# Patient Record
Sex: Female | Born: 1975 | Race: White | Hispanic: No | Marital: Married | Smoking: Former smoker
Health system: Southern US, Community
[De-identification: ages and names within clinical notes are randomized; demographics above are authoritative.]

## PROBLEM LIST (undated history)

## (undated) DIAGNOSIS — Z8619 Personal history of other infectious and parasitic diseases: Secondary | ICD-10-CM

## (undated) DIAGNOSIS — R091 Pleurisy: Secondary | ICD-10-CM

## (undated) DIAGNOSIS — D649 Anemia, unspecified: Secondary | ICD-10-CM

## (undated) DIAGNOSIS — I10 Essential (primary) hypertension: Secondary | ICD-10-CM

## (undated) DIAGNOSIS — M542 Cervicalgia: Secondary | ICD-10-CM

## (undated) DIAGNOSIS — B019 Varicella without complication: Secondary | ICD-10-CM

## (undated) DIAGNOSIS — E079 Disorder of thyroid, unspecified: Secondary | ICD-10-CM

## (undated) DIAGNOSIS — Z01419 Encounter for gynecological examination (general) (routine) without abnormal findings: Secondary | ICD-10-CM

## (undated) DIAGNOSIS — R079 Chest pain, unspecified: Secondary | ICD-10-CM

## (undated) DIAGNOSIS — E039 Hypothyroidism, unspecified: Secondary | ICD-10-CM

## (undated) DIAGNOSIS — R945 Abnormal results of liver function studies: Secondary | ICD-10-CM

## (undated) DIAGNOSIS — M25511 Pain in right shoulder: Secondary | ICD-10-CM

## (undated) DIAGNOSIS — T2220XA Burn of second degree of shoulder and upper limb, except wrist and hand, unspecified site, initial encounter: Secondary | ICD-10-CM

## (undated) DIAGNOSIS — R87629 Unspecified abnormal cytological findings in specimens from vagina: Secondary | ICD-10-CM

## (undated) DIAGNOSIS — J209 Acute bronchitis, unspecified: Secondary | ICD-10-CM

## (undated) DIAGNOSIS — Z5189 Encounter for other specified aftercare: Secondary | ICD-10-CM

## (undated) DIAGNOSIS — Z0000 Encounter for general adult medical examination without abnormal findings: Secondary | ICD-10-CM

## (undated) DIAGNOSIS — R51 Headache: Secondary | ICD-10-CM

## (undated) DIAGNOSIS — J329 Chronic sinusitis, unspecified: Secondary | ICD-10-CM

## (undated) HISTORY — DX: Chronic sinusitis, unspecified: J32.9

## (undated) HISTORY — DX: Varicella without complication: B01.9

## (undated) HISTORY — PX: TRUNK SKIN LESION EXCISIONAL BIOPSY: SUR474

## (undated) HISTORY — DX: Personal history of other infectious and parasitic diseases: Z86.19

## (undated) HISTORY — DX: Pleurisy: R09.1

## (undated) HISTORY — DX: Abnormal results of liver function studies: R94.5

## (undated) HISTORY — DX: Encounter for other specified aftercare: Z51.89

## (undated) HISTORY — DX: Anemia, unspecified: D64.9

## (undated) HISTORY — DX: Encounter for general adult medical examination without abnormal findings: Z00.00

## (undated) HISTORY — DX: Disorder of thyroid, unspecified: E07.9

## (undated) HISTORY — DX: Acute bronchitis, unspecified: J20.9

## (undated) HISTORY — DX: Cervicalgia: M54.2

## (undated) HISTORY — DX: Unspecified abnormal cytological findings in specimens from vagina: R87.629

## (undated) HISTORY — DX: Burn of second degree of shoulder and upper limb, except wrist and hand, unspecified site, initial encounter: T22.20XA

## (undated) HISTORY — DX: Headache: R51

## (undated) HISTORY — DX: Pain in right shoulder: M25.511

## (undated) HISTORY — PX: GYNECOLOGIC CRYOSURGERY: SHX857

## (undated) HISTORY — DX: Essential (primary) hypertension: I10

## (undated) HISTORY — DX: Encounter for gynecological examination (general) (routine) without abnormal findings: Z01.419

## (undated) HISTORY — DX: Hypothyroidism, unspecified: E03.9

## (undated) HISTORY — DX: Chest pain, unspecified: R07.9

---

## 2009-12-01 LAB — HM PAP SMEAR

## 2010-08-02 ENCOUNTER — Ambulatory Visit: Payer: Self-pay | Admitting: Internal Medicine

## 2010-09-08 ENCOUNTER — Ambulatory Visit (INDEPENDENT_AMBULATORY_CARE_PROVIDER_SITE_OTHER): Payer: Managed Care, Other (non HMO) | Admitting: Family Medicine

## 2010-09-08 ENCOUNTER — Encounter: Payer: Self-pay | Admitting: Family Medicine

## 2010-09-08 VITALS — BP 168/100 | HR 62 | Temp 98.2°F | Ht 65.75 in | Wt 122.1 lb

## 2010-09-08 DIAGNOSIS — Z8619 Personal history of other infectious and parasitic diseases: Secondary | ICD-10-CM

## 2010-09-08 DIAGNOSIS — T2220XA Burn of second degree of shoulder and upper limb, except wrist and hand, unspecified site, initial encounter: Secondary | ICD-10-CM

## 2010-09-08 DIAGNOSIS — E079 Disorder of thyroid, unspecified: Secondary | ICD-10-CM

## 2010-09-08 DIAGNOSIS — I10 Essential (primary) hypertension: Secondary | ICD-10-CM

## 2010-09-08 DIAGNOSIS — B019 Varicella without complication: Secondary | ICD-10-CM

## 2010-09-08 HISTORY — DX: Varicella without complication: B01.9

## 2010-09-08 HISTORY — DX: Personal history of other infectious and parasitic diseases: Z86.19

## 2010-09-08 NOTE — Patient Instructions (Signed)
Heartburn Heartburn is a painful, burning sensation in the chest. It may feel worse in certain positions, such as lying down or bending over. It is caused by stomach acid backing up into the tube that carries food from the mouth down to the stomach (lower esophagus).  CAUSES A number of conditions can cause or worsen heartburn, including:  Pregnancy.  Being overweight (obesity).  A condition called hiatal hernia, in which part or all of the stomach is moved up into the chest through a weakness in the diaphragm muscle.  Alcohol.  Exercise.  Eating just before going to bed.  Overeating.  Medications, including:  Nonsteroidal anti-inflammatory drugs, such as ibuprofen and naproxen.  Aspirin.  Some blood pressure medicines, including beta-blockers, calcium channel blockers, and alpha-blockers.  Nitrates (used to treat angina).  The asthma medication Theophylline.  Certain sedative drugs.  Heartburn may be worse after eating certain foods. These heartburn-causing foods are different for different people, but may include:  Peppers.  Chocolate.  Coffee.  High-fat foods, including fried foods.  Spicy foods.  Garlic, onions.  Citrus fruits, including oranges, grapefruit, lemons and limes.  Food containing tomatoes or tomato products.  Mint.  Carbonated beverages.  Vinegar.  SYMPTOMS  Symptoms may last for a few minutes or a few hours, and can include: Burning pain in the chest or lower throat.  Bitter taste in the mouth.  Coughing.  DIAGNOSIS If the usual treatments for heartburn do not improve your symptoms, then tests may be done to see if there is another condition present. Possible tests may include: X-rays.  Endoscopy. This is when a tube with a light and a camera on the end is used to examine the esophagus and the stomach.  Blood, breath, or stool tests may be used to check for bacteria that cause ulcers.  TREATMENT There are a number of non-prescription medicines used to treat  heartburn, including: Antacids.  Acid reducers (also called H-2 blockers).  Proton-pump inhibitors.  HOME CARE INSTRUCTIONS Raise the head of your bed by putting blocks under the legs.  Eat 2-3 hours before going to bed.  Stop smoking.  Try to reach and maintain a healthy weight.  Do not eat just a few very large meals. Instead, eat many smaller meals throughout the day.  Try to identify foods and beverages that make your symptoms worse, and avoid these.  Avoid tight clothing.  Do not exercise right after eating.  SEEK IMMEDIATE MEDICAL CARE IF YOU: Have severe chest pain that goes down your arm, or into your jaw or neck.  Feel sweaty, dizzy, or lightheaded.  Are short of breath.  Throw up (vomit) blood.  Have difficulty or pain with swallowing.  Have bloody or black, tarry stools.  Have bouts of heartburn more than three times a week for more than two weeks.  Document Released: 05/06/2008 Document Re-Released: 03/14/2009 [facility] Patient Information 2011 [place], [place].Allergies, Generic Allergies may happen from anything your body is sensitive to. This may be food, medicines, pollens, chemicals, and nearly anything around you in everyday life that produces allergens. An allergen is anything that causes an allergy producing substance. Heredity is often a factor in causing these problems. This means you may have some of the same allergies as your parents. Food allergies happen in all age groups. Food allergies are some of the most severe and life threatening. Some common food allergies are cow's milk, seafood, eggs, nuts, wheat, and soybeans. SYMPTOMS Swelling around the mouth.  An itchy red  rash or hives.  Vomiting or diarrhea.  Difficulty breathing.  SEVERE ALLERGIC REACTIONS ARE LIFE-THREATENING.  This reaction is called anaphylaxis. It can cause the mouth and throat to swell and cause difficulty with breathing and swallowing. In severe reactions only a trace amount of food (for  example, peanut oil in a salad) may cause death within seconds. Seasonal allergies occur in all age groups. These are seasonal because they usually occur during the same season every year. They may be a reaction to molds, grass pollens, or tree pollens. Other causes of problems are house dust mite allergens, pet dander, and mold spores. The symptoms often consist of nasal congestion, a runny itchy nose associated with sneezing, and tearing itchy eyes. There is often an associated itching of the mouth and ears. The problems happen when you come in contact with pollens and other allergens. Allergens are the particles in the air that the body reacts to with an allergic reaction. This causes you to release allergic antibodies. Through a chain of events, these eventually cause you to release histamine into the blood stream. Although it is meant to be protective to the body, it is this release that causes your discomfort. This is why you were given anti-histamines to feel better. If you are unable to pinpoint the offending allergen, it may be determined by skin or blood testing. Allergies cannot be cured but can be controlled with medicine. Hay fever is a collection of all or some of the seasonal allergy problems. It may often be treated with simple over-the-counter medicine such as diphenhydramine. Take medicine as directed. Do not drink alcohol or drive while taking this medicine. Check with your caregiver or package insert for child dosages. If these medicines are not effective, there are many new medicines your caregiver can prescribe. Stronger medicine such as nasal spray, eye drops, and corticosteroids may be used if the first things you try do not work well. Other treatments such as immunotherapy or desensitizing injections can be used if all else fails. Follow up with your caregiver if problems continue. These seasonal allergies are usually not life threatening. They are generally more of a nuisance that can  often be handled using medicine. HOME CARE INSTRUCTIONS If unsure what causes a reaction, keep a diary of foods eaten and symptoms that follow. Avoid foods that cause reactions.  If hives or rash are present:  Take medicine as directed.  You may use an over-the-counter antihistamine (diphenhydramine) for hives and itching as needed.  Apply cold compresses (cloths) to the skin or take baths in cool water. Avoid hot baths or showers. Heat will make a rash and itching worse.  If you are severely allergic:  Following a treatment for a severe reaction, hospitalization is often required for closer follow-up.  Wear a medic-alert bracelet or necklace stating the allergy.  You and your family must learn how to give adrenaline or use an anaphylaxis kit.  If you have had a severe reaction, always carry your anaphylaxis kit or EpiPen with you. Use this medicine as directed by your caregiver if a severe reaction is occurring. Failure to do so could have a fatal outcome.  SEE YOUR CAREGIVER IF: You suspect a food allergy. Symptoms generally happen within 30 minutes of eating a food.  Your symptoms have not gone away within 2 days or are getting worse.  You develop new symptoms.  You want to retest yourself or your child with a food or drink you think causes an allergic reaction.  Never do this if an anaphylactic reaction to that food or drink has happened before. Only do this under the care of a caregiver.  SEEK IMMEDIATE MEDICAL CARE IF: You have difficulty breathing, are wheezing, or have a tight feeling in your chest or throat.  You have a swollen mouth, or you have hives, swelling, or itching all over your body.  You have had a severe reaction that has responded to your anaphylaxis kit or an EpiPen. These reactions may return when the medicine has worn off. These reactions should be considered life threatening.  MAKE SURE YOU:  Understand these instructions.  Will watch your condition.  Will get help  right away if you are not doing well or get worse.  Document Released: 03/13/2002 Document Re-Released: 01/09/2009 [facility] Patient Information 2011 [place], [place].Burn Care Instructions Your skin is a natural barrier to infection. It is the largest organ of your body. Because of your burn, this natural protection has been damaged. To help prevent infection, it is very important to follow these instructions in the care of your burn. BURNS ARE CLASSIFIED AS:  First degree - only erythema or redness of skin. No scarring expected.   Second degree - blistering of skin. No scarring expected.   Third degree - destruction of all layers of skin, scarring expected. Depending on size it may require grafting.  HOME CARE INSTRUCTIONS  Wash hands well before changing your bandage.   Remove old bandage. (If bandage sticks to burn you may soak it off with cool, clean water).   Cleanse thoroughly but gently with mild soap and water.   Pat dry with a clean, dry cloth.   Apply a thin layer of anti-bacterial (germ) cream to the burn.   Apply clean bandages as shown to you in the Emergency Department or by your caregiver.   Keep dressing as clean and dry as possible.   Elevate affected area (such as hand or foot) for the first 24 hours, then as instructed by caregiver.   Only take over-the-counter or prescription medicines for pain, discomfort, or fever as directed by your caregiver.  SEEK IMMEDIATE MEDICAL CARE IF:  You develop excessive pain.   The burned area develops redness, tenderness, swelling, or red streaks near the burn.   The burned area develops pus or a foul odor.   You develop an oral temperature above 103.  MAKE SURE YOU:   Understand these instructions.   Will watch your condition.   Will get help right away if you are not doing well or get worse.  Document Released: 12/18/2004 Document Re-Released: 06/07/2009 [facility] Patient Information 2011 [place], [place].   Throat  irritation can be allergies or heartburn or both  Consider Loratadine/Claritin 10mg  daily for allergies and/or Ranitidine/Zantac 150 mg daily for the heartburn and see if symptoms resolve on either one or on both  For burn consider Cetaphil to wash gently daily and keep clean and dry, may apply Neosporin if cracks and/or gets uncomfortable

## 2010-09-12 ENCOUNTER — Encounter: Payer: Self-pay | Admitting: Family Medicine

## 2010-09-12 DIAGNOSIS — T2220XA Burn of second degree of shoulder and upper limb, except wrist and hand, unspecified site, initial encounter: Secondary | ICD-10-CM

## 2010-09-12 DIAGNOSIS — E079 Disorder of thyroid, unspecified: Secondary | ICD-10-CM | POA: Insufficient documentation

## 2010-09-12 DIAGNOSIS — I10 Essential (primary) hypertension: Secondary | ICD-10-CM | POA: Insufficient documentation

## 2010-09-12 HISTORY — DX: Burn of second degree of shoulder and upper limb, except wrist and hand, unspecified site, initial encounter: T22.20XA

## 2010-09-12 NOTE — Assessment & Plan Note (Signed)
Managed on levothyroxine, will check levels and obtain old labs from previous practitioners, before making any changes
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
## 2010-09-12 NOTE — Assessment & Plan Note (Signed)
BP well controlled on low dose of Toprol, will not make any changes to meds at today's visit. Minimize sodium, maintain good exercise regimen and attempt 7-8 hours of sleep.
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
## 2010-11-13 ENCOUNTER — Other Ambulatory Visit (INDEPENDENT_AMBULATORY_CARE_PROVIDER_SITE_OTHER): Payer: Managed Care, Other (non HMO)

## 2010-11-13 DIAGNOSIS — I10 Essential (primary) hypertension: Secondary | ICD-10-CM

## 2010-11-13 DIAGNOSIS — E079 Disorder of thyroid, unspecified: Secondary | ICD-10-CM

## 2010-11-13 LAB — CBC
MCHC: 33.9 g/dL (ref 30.0–36.0)
Platelets: 188 10*3/uL (ref 150–400)
RDW: 12.2 % (ref 11.5–15.5)
WBC: 4 10*3/uL (ref 4.0–10.5)

## 2010-11-14 LAB — HEPATIC FUNCTION PANEL
AST: 49 U/L — ABNORMAL HIGH (ref 0–37)
Albumin: 4.2 g/dL (ref 3.5–5.2)
Alkaline Phosphatase: 67 U/L (ref 39–117)
Total Bilirubin: 0.9 mg/dL (ref 0.3–1.2)

## 2010-11-14 LAB — RENAL FUNCTION PANEL
Albumin: 4.2 g/dL (ref 3.5–5.2)
BUN: 14 mg/dL (ref 6–23)
CO2: 27 mEq/L (ref 19–32)
Calcium: 9.2 mg/dL (ref 8.4–10.5)
Creatinine, Ser: 0.8 mg/dL (ref 0.4–1.2)

## 2010-11-15 ENCOUNTER — Other Ambulatory Visit: Payer: Managed Care, Other (non HMO)

## 2010-12-08 ENCOUNTER — Ambulatory Visit (INDEPENDENT_AMBULATORY_CARE_PROVIDER_SITE_OTHER): Payer: Managed Care, Other (non HMO) | Admitting: Family Medicine

## 2010-12-08 ENCOUNTER — Encounter: Payer: Self-pay | Admitting: Family Medicine

## 2010-12-08 DIAGNOSIS — Z01419 Encounter for gynecological examination (general) (routine) without abnormal findings: Secondary | ICD-10-CM

## 2010-12-08 DIAGNOSIS — M542 Cervicalgia: Secondary | ICD-10-CM

## 2010-12-08 DIAGNOSIS — E079 Disorder of thyroid, unspecified: Secondary | ICD-10-CM

## 2010-12-08 DIAGNOSIS — R51 Headache: Secondary | ICD-10-CM

## 2010-12-08 DIAGNOSIS — Z124 Encounter for screening for malignant neoplasm of cervix: Secondary | ICD-10-CM

## 2010-12-08 DIAGNOSIS — I10 Essential (primary) hypertension: Secondary | ICD-10-CM

## 2010-12-08 MED ORDER — CYCLOBENZAPRINE HCL 10 MG PO TABS: 10.0000 mg | ORAL_TABLET | Freq: Three times a day (TID) | ORAL | Status: DC | PRN

## 2010-12-08 NOTE — Patient Instructions (Signed)
Back Pain, Adult Low back pain is very common. About 1 in 5 people have back pain.The cause of low back pain is rarely dangerous. The pain often gets better over time.About half of people with a sudden onset of back pain feel better in just 2 weeks. About 8 in 10 people feel better by 6 weeks.  CAUSES Some common causes of back pain include:  Strain of the muscles or ligaments supporting the spine.   Wear and tear (degeneration) of the spinal discs.   Arthritis.   Direct injury to the back.  DIAGNOSIS Most of the time, the direct cause of low back pain is not known.However, back pain can be treated effectively even when the exact cause of the pain is unknown.Answering your caregiver's questions about your overall health and symptoms is one of the most accurate ways to make sure the cause of your pain is not dangerous. If your caregiver needs more information, he or she may order lab work or imaging tests (X-rays or MRIs).However, even if imaging tests show changes in your back, this usually does not require surgery. HOME CARE INSTRUCTIONS For many people, back pain returns.Since low back pain is rarely dangerous, it is often a condition that people can learn to manageon their own.   Remain active. It is stressful on the back to sit or stand in one place. Do not sit, drive, or stand in one place for more than 30 minutes at a time. Take short walks on level surfaces as soon as pain allows.Try to increase the length of time you walk each day.   Do not stay in bed.Resting more than 1 or 2 days can delay your recovery.   Do not avoid exercise or work.Your body is made to move.It is not dangerous to be active, even though your back may hurt.Your back will likely heal faster if you return to being active before your pain is gone.   Pay attention to your body when you bend and lift. Many people have less discomfortwhen lifting if they bend their knees, keep the load close to their  bodies,and avoid twisting. Often, the most comfortable positions are those that put less stress on your recovering back.   Find a comfortable position to sleep. Use a firm mattress and lie on your side with your knees slightly bent. If you lie on your back, put a pillow under your knees.   Only take over-the-counter or prescription medicines as directed by your caregiver. Over-the-counter medicines to reduce pain and inflammation are often the most helpful.Your caregiver may prescribe muscle relaxant drugs.These medicines help dull your pain so you can more quickly return to your normal activities and healthy exercise.   Put ice on the injured area.   Put ice in a plastic bag.   Place a towel between your skin and the bag.   Leave the ice on for 15 to 20 minutes, 3 to 4 times a day for the first 2 to 3 days. After that, ice and heat may be alternated to reduce pain and spasms.   Ask your caregiver about trying back exercises and gentle massage. This may be of some benefit.   Avoid feeling anxious or stressed.Stress increases muscle tension and can worsen back pain.It is important to recognize when you are anxious or stressed and learn ways to manage it.Exercise is a great option.  SEEK MEDICAL CARE IF:  You have pain that is not relieved with rest or medicine.   You have   pain that does not improve in 1 week.   You have new symptoms.   You are generally not feeling well.  SEEK IMMEDIATE MEDICAL CARE IF:   You have pain that radiates from your back into your legs.   You develop new bowel or bladder control problems.   You have unusual weakness or numbness in your arms or legs.   You develop nausea or vomiting.   You develop abdominal pain.   You feel faint.  Document Released: 12/18/2004 Document Revised: 08/30/2010 Document Reviewed: 05/08/2010 [facility] Patient Information 2012 [place], [place].   Try moist heat, gentle stretching and aspercreme as needed

## 2010-12-08 NOTE — Progress Notes (Signed)
Marisa Contreras [id number] [birth year] 12/08/2010      Progress Note-Follow Up  Subjective  Chief Complaint  Chief Complaint  Patient presents with  . Gynecologic Exam    pap smear w/ breast exam    HPI  35 year old Caucasian female who is in today for GYN exam and is requesting to discontinue is a patient of her metoprolol because she is considering attempting pregnancy. She is complaining of some trouble with left-sided headache about a week and half ago. She describes it as a sharp pain in her left parietal region without any other associated symptoms. Then about a week ago she had an episode of right eye field of vision going wavy. She noticed these visual changes have occurred off and on since she was 17 and are infrequent. He did not have any associated symptoms with them. She's also had a lot of neck pain in the last week. Woke up in the morning with severe pain and stiffness the left side of her neck to the point where is difficult to turn her head. She has had some massage therapy and that is helpful temporarily. Unfortunately it recurs. No falls or trauma. No radicular symptoms. No fevers, congestion, chest pain, palpitations, shortness of breath, GI or GU complaints. GYN history G0 P0 and denies any history of abnormal Paps.  Past Medical History  Diagnosis Date  . Chicken pox 09/08/2010  . Second degree burn of arm 09/12/2010  . Hypertension   . Thyroid disease     Past Surgical History  Procedure Date  . Trunk skin lesion excisional biopsy     abd, benign    Family History  Problem Relation Age of Onset  . Hypertension Father   . Thyroid disease Maternal Grandmother   . Hyperlipidemia Maternal Grandmother   . Heart disease Maternal Grandfather   . Alcohol abuse Paternal Grandmother   . Heart attack Paternal Grandfather   . Heart disease Paternal Grandfather     History   Social History  . Marital Status: Married    Spouse Name: N/A    Number of Children: N/A    . Years of Education: N/A   Occupational History  . Not on file.   Social History Main Topics  . Smoking status: Former Smoker    Types: Cigarettes    Quit date: 01/01/2006  . Smokeless tobacco: Never Used  . Alcohol Use: Yes     2 bottle of wine a week  . Drug Use: No  . Sexually Active: Yes -- Female partner(s)   Other Topics Concern  . Not on file   Social History Narrative  . No narrative on file    Current Outpatient Prescriptions on File Prior to Visit  Medication Sig Dispense Refill  . levothyroxine (SYNTHROID, LEVOTHROID) 88 MCG tablet Take 88 mcg by mouth daily.        . metoprolol succinate (TOPROL-XL) 25 MG 24 hr tablet         No Known Allergies  Review of Systems  Review of Systems  Constitutional: Negative for fever, chills and malaise/fatigue.  HENT: Positive for neck pain. Negative for hearing loss, nosebleeds and congestion.   Eyes: Positive for blurred vision. Negative for double vision, photophobia, pain and discharge.       Right eye wavy vision, 1 self limited episode since last visit  Respiratory: Negative for cough, sputum production, shortness of breath and wheezing.   Cardiovascular: Negative for chest pain, palpitations and leg swelling.  Gastrointestinal: Negative  for heartburn, nausea, vomiting, abdominal pain, diarrhea, constipation and blood in stool.  Genitourinary: Negative for dysuria, urgency, frequency and hematuria.  Musculoskeletal: Negative for myalgias, back pain and falls.  Skin: Negative for rash.  Neurological: Positive for headaches. Negative for dizziness, tremors, sensory change, focal weakness, loss of consciousness and weakness.  Endo/Heme/Allergies: Negative for polydipsia. Does not bruise/bleed easily.  Psychiatric/Behavioral: Negative for depression and suicidal ideas. The patient is not nervous/anxious and does not have insomnia.     Objective  BP 136/85  Pulse 54  Temp(Src) 98.6 F (37 C) (Oral)  Ht 5' 5.75"  (1.67 m)  Wt 128 lb 12.8 oz (58.423 kg)  BMI 20.95 kg/m2  SpO2 99%  LMP 12/02/2010  Physical Exam  Physical Exam  Constitutional: She is oriented to person, place, and time and well-developed, well-nourished, and in no distress. No distress.  HENT:  Head: Normocephalic and atraumatic.  Eyes: Conjunctivae are normal.  Neck: Neck supple. No thyromegaly present.  Cardiovascular: Normal rate, regular rhythm and normal heart sounds.   No murmur heard. Pulmonary/Chest: Effort normal and breath sounds normal. She has no wheezes.  Abdominal: She exhibits no distension and no mass.  Musculoskeletal: She exhibits no edema.  Lymphadenopathy:    She has no cervical adenopathy.  Neurological: She is alert and oriented to person, place, and time.  Skin: Skin is warm and dry. No rash noted. She is not diaphoretic.  Psychiatric: Memory, affect and judgment normal.    Lab Results  Component Value Date   TSH 1.13 11/13/2010   Lab Results  Component Value Date   WBC 4.0 11/13/2010   HGB 13.0 11/13/2010   HCT 38.3 11/13/2010   MCV 88.7 11/13/2010   PLT 188 11/13/2010   Lab Results  Component Value Date   CREATININE 0.8 11/13/2010   BUN 14 11/13/2010   NA 138 11/13/2010   K 4.5 11/13/2010   CL 103 11/13/2010   CO2 27 11/13/2010   Lab Results  Component Value Date   ALT 43* 11/13/2010   AST 49* 11/13/2010   ALKPHOS 67 11/13/2010   BILITOT 0.9 11/13/2010     Assessment & Plan   Hypertension Adequately controlled, patient anxious to come off of medications, she is encouraged to avoid sodium, get adequate sleep, increase exercise and she is warned that it is possible that her HA will get worse when she decreases her dosing but she wants to try dropping her Metoprolol to 1/2 tab, she will monitor her BP and symptoms and increase med back up as needed. If numbers remain acceptable we will proceed with bp check in 2 weeks and consider discontinuation of meds  Thyroid disease Stable  on current dose of Levothyroxine  Neck pain, acute Patient acknowledges being under increased stress lately and has been having some neck pain and had 1 HA. She describes all of her symptoms as, left. She woke up recently with pain and stiffness in the left side of her neck to the point where she couldn't turn her head. She's been going to get some massage therapy and that does give her some relief. Just an episode of some parietal lobe left-sided headache she describes as sharp lasting about 20 minutes a week and a half ago resolved spontaneously and had no other associated symptoms. She is encouraged to apply moist heat apply gentle stretching continue with massage and consider chiropractic. She is given some cyclobenzaprine to use when necessary and may also use ibuprofen when necessary for headaches  reported symptoms worsen or further concerns  Visit for gynecologic examination Pap smear taken today results pending Patient declines flu shot today.  Headache Patient had one left sided HA recently but she also describes symptoms c/w ocular migraines. She reports she has had episodes of her right eye field of vision going wavy dating back to age 31. She had an episode about a week ago. No other symptoms at that time and the episode resolved spontaneously. She is encouraged to decrease stress, increase sleep and exercise, eat small, frequent meals and watch her food for any possible triggers, Maintain adequate hydration and report if symptoms worsen

## 2010-12-11 ENCOUNTER — Encounter: Payer: Self-pay | Admitting: Family Medicine

## 2010-12-11 DIAGNOSIS — M25511 Pain in right shoulder: Secondary | ICD-10-CM | POA: Insufficient documentation

## 2010-12-11 DIAGNOSIS — Z01419 Encounter for gynecological examination (general) (routine) without abnormal findings: Secondary | ICD-10-CM

## 2010-12-11 DIAGNOSIS — R519 Headache, unspecified: Secondary | ICD-10-CM | POA: Insufficient documentation

## 2010-12-11 DIAGNOSIS — Z0000 Encounter for general adult medical examination without abnormal findings: Secondary | ICD-10-CM | POA: Insufficient documentation

## 2010-12-11 DIAGNOSIS — M542 Cervicalgia: Secondary | ICD-10-CM

## 2010-12-11 DIAGNOSIS — R51 Headache: Secondary | ICD-10-CM

## 2010-12-11 HISTORY — DX: Encounter for general adult medical examination without abnormal findings: Z00.00

## 2010-12-11 HISTORY — DX: Pain in right shoulder: M25.511

## 2010-12-11 HISTORY — DX: Cervicalgia: M54.2

## 2010-12-11 HISTORY — DX: Encounter for gynecological examination (general) (routine) without abnormal findings: Z01.419

## 2010-12-11 HISTORY — DX: Headache: R51

## 2010-12-11 NOTE — Assessment & Plan Note (Signed)
Patient had one left sided HA recently but she also describes symptoms c/w ocular migraines. She reports she has had episodes of her right eye field of vision going wavy dating back to age 35. She had an episode about a week ago. No other symptoms at that time and the episode resolved spontaneously. She is encouraged to decrease stress, increase sleep and exercise, eat small, frequent meals and watch her food for any possible triggers, Maintain adequate hydration and report if symptoms worsen

## 2010-12-11 NOTE — Assessment & Plan Note (Signed)
Patient acknowledges being under increased stress lately and has been having some neck pain and had 1 HA. She describes all of her symptoms as, left. She woke up recently with pain and stiffness in the left side of her neck to the point where she couldn't turn her head. She's been going to get some massage therapy and that does give her some relief. Just an episode of some parietal lobe left-sided headache she describes as sharp lasting about 20 minutes a week and a half ago resolved spontaneously and had no other associated symptoms. She is encouraged to apply moist heat apply gentle stretching continue with massage and consider chiropractic. She is given some cyclobenzaprine to use when necessary and may also use ibuprofen when necessary for headaches reported symptoms worsen or further concerns

## 2010-12-11 NOTE — Assessment & Plan Note (Addendum)
Adequately controlled, patient anxious to come off of medications, she is encouraged to avoid sodium, get adequate sleep, increase exercise and she is warned that it is possible that her HA will get worse when she decreases her dosing but she wants to try dropping her Metoprolol to 1/2 tab, she will monitor her BP and symptoms and increase med back up as needed. If numbers remain acceptable we will proceed with bp check in 2 weeks and consider discontinuation of meds

## 2010-12-11 NOTE — Assessment & Plan Note (Signed)
Stable on current dose of Levothyroxine 

## 2010-12-11 NOTE — Assessment & Plan Note (Signed)
Pap smear taken today results pending Patient declines flu shot today.

## 2010-12-13 ENCOUNTER — Other Ambulatory Visit (HOSPITAL_COMMUNITY)
Admission: RE | Admit: 2010-12-13 | Discharge: 2010-12-13 | Disposition: A | Discharge status: Home / Self Care | Payer: Managed Care, Other (non HMO) | Source: Ambulatory Visit | Attending: Family Medicine | Admitting: Family Medicine

## 2010-12-15 NOTE — Progress Notes (Signed)
Quick Note:  Left a message for pt to return call. ______ 

## 2010-12-18 ENCOUNTER — Telehealth: Payer: Self-pay | Admitting: Family Medicine

## 2010-12-18 NOTE — Telephone Encounter (Signed)
Please contact patient with lab results.

## 2010-12-19 NOTE — Telephone Encounter (Signed)
Pt informed

## 2011-04-02 ENCOUNTER — Telehealth: Payer: Self-pay | Admitting: Family Medicine

## 2011-04-02 MED ORDER — ACEBUTOLOL HCL 200 MG PO CAPS: 200.0000 mg | ORAL_CAPSULE | Freq: Two times a day (BID) | ORAL | Status: DC

## 2011-04-02 NOTE — Telephone Encounter (Signed)
Patient would like to try the new medication. I will send to [facility] on [address] per patients request.

## 2011-04-02 NOTE — Telephone Encounter (Signed)
appt made for 04-16-11 at 3:30 pm

## 2011-04-02 NOTE — Telephone Encounter (Signed)
Please advise 

## 2011-04-02 NOTE — Telephone Encounter (Signed)
The only beta blocker that is category B is Acebutolol 200mg  po bid, I can prescribe it but then she would have to come in in 1-2 weeks to have bp checked, Disp #60 or she can discuss with her OB and see which options she is comfortable with

## 2011-04-13 ENCOUNTER — Ambulatory Visit: Payer: Managed Care, Other (non HMO) | Admitting: Family Medicine

## 2011-04-16 ENCOUNTER — Ambulatory Visit (INDEPENDENT_AMBULATORY_CARE_PROVIDER_SITE_OTHER): Payer: Managed Care, Other (non HMO) | Admitting: Family Medicine

## 2011-04-16 ENCOUNTER — Encounter: Payer: Self-pay | Admitting: Family Medicine

## 2011-04-16 VITALS — BP 119/79 | HR 59 | Temp 98.3°F | Ht 65.75 in | Wt 127.0 lb

## 2011-04-16 DIAGNOSIS — E079 Disorder of thyroid, unspecified: Secondary | ICD-10-CM

## 2011-04-16 DIAGNOSIS — I10 Essential (primary) hypertension: Secondary | ICD-10-CM

## 2011-04-16 DIAGNOSIS — M542 Cervicalgia: Secondary | ICD-10-CM

## 2011-04-16 DIAGNOSIS — R7989 Other specified abnormal findings of blood chemistry: Secondary | ICD-10-CM

## 2011-04-16 HISTORY — DX: Other specified abnormal findings of blood chemistry: R79.89

## 2011-04-16 MED ORDER — ACEBUTOLOL HCL 200 MG PO CAPS: 200.0000 mg | ORAL_CAPSULE | Freq: Two times a day (BID) | ORAL | Status: DC

## 2011-04-16 NOTE — Assessment & Plan Note (Signed)
Patient start Acebutolol bid and has good bp control today, is given a 3 month supply and she is encouraged to increase exercise

## 2011-04-16 NOTE — Progress Notes (Signed)
Patient ID: Marisa Contreras, female   DOB: [birth year], 36 y.o.   MRN: [id number] Liridona Mashaw [id number] May 23, 1975 04/16/2011      Progress Note-Follow Up  Subjective  Chief Complaint  Chief Complaint  Patient presents with  . Follow-up    HPI  This is a 36 year old Caucasian female who is here today for followup on hypertension. She had called and restarted up blood pressure medications and feels well on the medication. No chest pain, facial, shortness of breath, GI or GU complaints. She does note her sister also has difficulty with some elevated liver functions for no obvious reason. She continues to show with neck pain but does use medications infrequently as needed. Most days of the symptoms are minimal  Past Medical History  Diagnosis Date  . Chicken pox 09/08/2010  . Second degree burn of arm 09/12/2010  . Hypertension   . Thyroid disease   . Neck pain, acute 12/11/2010  . Visit for gynecologic examination 12/11/2010  . Headache 12/11/2010  . Elevated liver function tests 04/16/2011    Past Surgical History  Procedure Date  . Trunk skin lesion excisional biopsy     abd, benign    Family History  Problem Relation Age of Onset  . Hypertension Father   . Thyroid disease Maternal Grandmother   . Hyperlipidemia Maternal Grandmother   . Heart disease Maternal Grandfather   . Alcohol abuse Paternal Grandmother   . Heart attack Paternal Grandfather   . Heart disease Paternal Grandfather     History   Social History  . Marital Status: Married    Spouse Name: N/A    Number of Children: N/A  . Years of Education: N/A   Occupational History  . Not on file.   Social History Main Topics  . Smoking status: Former Smoker    Types: Cigarettes    Quit date: 01/01/2006  . Smokeless tobacco: Never Used  . Alcohol Use: Yes     2 bottle of wine a week  . Drug Use: No  . Sexually Active: Yes -- Female partner(s)   Other Topics Concern  . Not on file   Social  History Narrative  . No narrative on file    Current Outpatient Prescriptions on File Prior to Visit  Medication Sig Dispense Refill  . levothyroxine (SYNTHROID, LEVOTHROID) 88 MCG tablet Take 88 mcg by mouth daily.        Marland Kitchen DISCONTD: acebutolol (SECTRAL) 200 MG capsule Take 1 capsule (200 mg total) by mouth 2 (two) times daily.  60 capsule  0    No Known Allergies  Review of Systems  Review of Systems  Constitutional: Negative for fever and malaise/fatigue.  HENT: Positive for neck pain. Negative for congestion.   Eyes: Negative for discharge.  Respiratory: Negative for shortness of breath.   Cardiovascular: Negative for chest pain, palpitations and leg swelling.  Gastrointestinal: Negative for nausea, abdominal pain and diarrhea.  Genitourinary: Negative for dysuria.  Musculoskeletal: Negative for falls.  Skin: Negative for rash.  Neurological: Negative for loss of consciousness and headaches.  Endo/Heme/Allergies: Negative for polydipsia.  Psychiatric/Behavioral: Negative for depression and suicidal ideas. The patient is not nervous/anxious and does not have insomnia.     Objective  BP 119/79  Pulse 59  Temp(Src) 98.3 F (36.8 C) (Temporal)  Ht 5' 5.75" (1.67 m)  Wt 127 lb (57.607 kg)  BMI 20.65 kg/m2  SpO2 100%  LMP 04/02/2011  Physical Exam  Physical Exam  Constitutional: She is oriented  to person, place, and time and well-developed, well-nourished, and in no distress. No distress.  HENT:  Head: Normocephalic and atraumatic.  Eyes: Conjunctivae are normal.  Neck: Neck supple. No thyromegaly present.  Cardiovascular: Normal rate, regular rhythm and normal heart sounds.   No murmur heard. Pulmonary/Chest: Effort normal and breath sounds normal. She has no wheezes.  Abdominal: She exhibits no distension and no mass.  Musculoskeletal: She exhibits no edema.  Lymphadenopathy:    She has no cervical adenopathy.  Neurological: She is alert and oriented to person,  place, and time.  Skin: Skin is warm and dry. No rash noted. She is not diaphoretic.  Psychiatric: Memory, affect and judgment normal.    Lab Results  Component Value Date   TSH 1.13 11/13/2010   Lab Results  Component Value Date   WBC 4.0 11/13/2010   HGB 13.0 11/13/2010   HCT 38.3 11/13/2010   MCV 88.7 11/13/2010   PLT 188 11/13/2010   Lab Results  Component Value Date   CREATININE 0.8 11/13/2010   BUN 14 11/13/2010   NA 138 11/13/2010   K 4.5 11/13/2010   CL 103 11/13/2010   CO2 27 11/13/2010   Lab Results  Component Value Date   ALT 43* 11/13/2010   AST 49* 11/13/2010   ALKPHOS 67 11/13/2010   BILITOT 0.9 11/13/2010      Assessment & Plan  Hypertension Patient start Acebutolol bid and has good bp control today, is given a 3 month supply and she is encouraged to increase exercise  Neck pain, acute Intermittent, encouraged moist heat and stretching, may use meds prn  Thyroid disease Well controlled on current dosing of med  Elevated liver function tests Mild, will repeat labs in the fall with annual exam, patient declines lab work today

## 2011-04-16 NOTE — Assessment & Plan Note (Signed)
Intermittent, encouraged moist heat and stretching, may use meds prn

## 2011-04-16 NOTE — Patient Instructions (Signed)
Fatty Liver Fatty liver is the accumulation of fat in liver cells. It is also called hepatosteatosis or steatohepatitis. It is normal for your liver to contain some fat. If fat is more than 5 to 10% of your liver's weight, you have fatty liver.  There are often no symptoms (problems) for years while damage is still occurring. People often learn about their fatty liver when they have medical tests for other reasons. Fat can damage your liver for years or even decades without causing problems. When it becomes severe, it can cause fatigue, weight loss, weakness, and confusion. This makes you more likely to develop more serious liver problems. The liver is the largest organ in the body. It does a lot of work and often gives no warning signs when it is sick until late in a disease. The liver has many important jobs including:  Breaking down foods.   Storing vitamins, iron, and other minerals.   Making proteins.   Making bile for food digestion.   Breaking down many products including medications, alcohol and some poisons.  CAUSES  There are a number of different conditions, medications, and poisons that can cause a fatty liver. Eating too many calories causes fat to build up in the liver. Not processing and breaking fats down normally may also cause this. Certain conditions, such as obesity, diabetes, and high triglycerides also cause this. Most fatty liver patients tend to be middle-aged and over weight.  Some causes of fatty liver are:  Alcohol over consumption.   Malnutrition.   Steroid use.   Valproic acid toxicity.   Obesity.   Cushing's syndrome.   Poisons.   Tetracycline in high dosages.   Pregnancy.   Diabetes.   Hyperlipidemia.   Rapid weight loss.  Some people develop fatty liver even having none of these conditions. SYMPTOMS  Fatty liver most often causes no problems. This is called asymptomatic.  It can be diagnosed with blood tests and also by a liver biopsy.    It is one of the most common causes of minor elevations of liver enzymes on routine blood tests.   Specialized Imaging of the liver using ultrasound, CT (computed tomography) scan, or MRI (magnetic resonance imaging) can suggest a fatty liver but a biopsy is needed to confirm it.   A biopsy involves taking a small sample of liver tissue. This is done by using a needle. It is then looked at under a microscope by a specialist.  TREATMENT  It is important to treat the cause. Simple fatty liver without a medical reason may not need treatment.  Weight loss, fat restriction, and exercise in overweight patients produces inconsistent results but is worth trying.   Fatty liver due to alcohol toxicity may not improve even with stopping drinking.   Good control of diabetes may reduce fatty liver.   Lower your triglycerides through diet, medication or both.   Eat a balanced, healthy diet.   Increase your physical activity.   Get regular checkups from a liver specialist.   There are no medical or surgical treatments for a fatty liver or NASH, but improving your diet and increasing your exercise may help prevent or reverse some of the damage.  PROGNOSIS  Fatty liver may cause no damage or it can lead to an inflammation of the liver. This is, called steatohepatitis. When it is linked to alcohol abuse, it is called alcoholic steatohepatitis. It often is not linked to alcohol. It is then called nonalcoholic steatohepatitis, or NASH. Over   time the liver may become scarred and hardened. This condition is called cirrhosis. Cirrhosis is serious and may lead to liver failure or cancer. NASH is one of the leading causes of cirrhosis. About 10-20% of Americans have fatty liver and a smaller 2-5% has NASH. Document Released: 02/02/2005 Document Revised: 12/07/2010 Document Reviewed: 03/28/2005 [employer] Patient Information 2012 [employer]. 

## 2011-04-16 NOTE — Assessment & Plan Note (Signed)
Well controlled on current dosing of med

## 2011-04-16 NOTE — Assessment & Plan Note (Signed)
Mild, will repeat labs in the fall with annual exam, patient declines lab work today

## 2011-05-03 ENCOUNTER — Encounter: Payer: Self-pay | Admitting: Family Medicine

## 2011-05-03 ENCOUNTER — Ambulatory Visit (INDEPENDENT_AMBULATORY_CARE_PROVIDER_SITE_OTHER): Payer: Managed Care, Other (non HMO) | Admitting: Family Medicine

## 2011-05-03 DIAGNOSIS — L509 Urticaria, unspecified: Secondary | ICD-10-CM | POA: Insufficient documentation

## 2011-05-03 MED ORDER — ZOSTER VACCINE LIVE 19400 UNT/0.65ML ~~LOC~~ SOLR: 0.6500 mL | Freq: Once | SUBCUTANEOUS | Status: AC

## 2011-05-03 MED ORDER — PREDNISONE 20 MG PO TABS: ORAL_TABLET | ORAL | Status: DC

## 2011-05-03 NOTE — Progress Notes (Signed)
OFFICE NOTE  05/03/2011  CC:  Chief Complaint  Patient presents with  . Rash    itching on arm, hips, and legs. started yesterday when working w/ [employer] in [place]     HPI: Patient is a 36 y.o. Caucasian female who is here for itchy rash. Onset yesterday within an hour or two of handling lots of used clothing.  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   Rash started on hands, very itchy little hives.  Has gradually spread to include both arms, both lateral hip areas, both knees, both elbows, and a few on her trunk.  She basically feels itchy all over, even scalp and face.  Denies fever, SOB, wheezing, swelling of tongue,face, or throat, or any joint swelling or pain.  Denies malaise, denies n/v or diarrhea.  No recent illnesses. No recent new foods or OTC meds/supplements.  Her acebutolol was started approx 2 wks ago but she had no rash until yesterday.  Pertinent PMH: **No prior hx of urticarial rash2 Past Medical History  Diagnosis Date  . Chicken pox 09/08/2010  . Second degree burn of arm 09/12/2010  . Hypertension   . Thyroid disease   . Neck pain, acute 12/11/2010  . Visit for gynecologic examination 12/11/2010  . Headache 12/11/2010  . Elevated liver function tests 04/16/2011    MEDS:  Outpatient Prescriptions Prior to Visit  Medication Sig Dispense Refill  . acebutolol (SECTRAL) 200 MG capsule Take 1 capsule (200 mg total) by mouth 2 (two) times daily.  180 capsule  3  . levothyroxine (SYNTHROID, LEVOTHROID) 88 MCG tablet Take 88 mcg by mouth daily.          PE: Blood pressure 137/87, pulse 54, temperature 98.9 F (37.2 C), temperature source Temporal, height 5' 5.75" (1.67 m), weight 124 lb 12.8 oz (56.609 kg), last menstrual period 04/02/2011, SpO2 100.00%. Gen: Alert, well appearing.  Patient is oriented to person, place, time, and  situation. No facial swelling or joint swelling.  Her olecranon areas are erythematous and without focal lesion. She has many hives, anywhere from 2-3 mm to 2 cm in size--scattered over hands, arms, both lateral hip regions, both knees and a few on trunk.  No petechiae, no pustules or papules, no ulcerations or significant excoriations.    IMPRESSION AND PLAN:  Urticaria Etiology/trigger unknown, but certainly could have been something associated with the contact with lots of used clothing yesterday just prior to onset of the rash. No other symptoms or signs of systemic allergic rxn are noted. I recommended she start a daily dose of zyrtec 10mg  and she may supplement with a bedtime dose of benadryl 25mg  if needed. I eRx'd a script for prednisone 20mg  tabs that she may fill IF the antihistamine treatment alone is not helpful enough over the next few days. Call or seek med attention if sx's of worsening systemic allergic rxn occur--SOB, swelling in face/lips/tongue, throat feeling constricted, wheezing, etc. She was concerned that this may be shingles but I reassured her that this was definitely not shingles.  She expressed significant desire to get the shingles vaccine, so I printed a rx for this for her today.  She'll certainly wait until her current rash issue is completely clear before getting this vaccine.  We also discussed the fact that insurance coverage is spotty even for pt's for whom the vaccine is indicated, and she said  she did not mind paying full price out of pocket if necessary. We also reviewed the recommendation regarding this vaccine and pregnancy: do not get this vaccine if pregnancy is anticipated within 4 mo of getting the vaccine.  She expressed understanding and will likely still wait and discuss this in further detail with Dr. Abner Greenspan at a f/u appt in the future.     FOLLOW UP: prn

## 2011-05-03 NOTE — Assessment & Plan Note (Signed)
Etiology/trigger unknown, but certainly could have been something associated with the contact with lots of used clothing yesterday just prior to onset of the rash. No other symptoms or signs of systemic allergic rxn are noted. I recommended she start a daily dose of zyrtec 10mg  and she may supplement with a bedtime dose of benadryl 25mg  if needed. I eRx'd a script for prednisone 20mg  tabs that she may fill IF the antihistamine treatment alone is not helpful enough over the next few days. Call or seek med attention if sx's of worsening systemic allergic rxn occur--SOB, swelling in face/lips/tongue, throat feeling constricted, wheezing, etc. She was concerned that this may be shingles but I reassured her that this was definitely not shingles.  She expressed significant desire to get the shingles vaccine, so I printed a rx for this for her today.  She'll certainly wait until her current rash issue is completely clear before getting this vaccine.  We also discussed the fact that insurance coverage is spotty even for pt's for whom the vaccine is indicated, and she said she did not mind paying full price out of pocket if necessary. We also reviewed the recommendation regarding this vaccine and pregnancy: do not get this vaccine if pregnancy is anticipated within 4 mo of getting the vaccine.  She expressed understanding and will likely still wait and discuss this in further detail with Dr. Abner Greenspan at a f/u appt in the future.

## 2011-05-03 NOTE — Patient Instructions (Signed)
Buy OTC generic zyrtec and take one tab daily. You may also take a 25mg  dose of generic benadryl at bedtime if itching still too bothersome to sleep. Call any time in the next few days if you want to go ahead and start prednisone.

## 2011-08-15 ENCOUNTER — Telehealth: Payer: Self-pay | Admitting: Family Medicine

## 2011-08-15 NOTE — Telephone Encounter (Signed)
Patient is going to [place] and needs vaccinations for typhoid, polio, hep A. She needs to know if we have these in stock & also whether she has already had any of these. Please contact patient

## 2011-08-15 NOTE — Telephone Encounter (Signed)
Pt informed and states [facility] [place] [facility]

## 2011-08-15 NOTE — Telephone Encounter (Signed)
I left a message for patient to return my call. We only have the Hep A. Per MD pt is going to need to call the [facility]. We can give pt a Hep A or pt can get all 3 at the [facility].

## 2011-09-10 ENCOUNTER — Other Ambulatory Visit: Payer: Self-pay

## 2011-09-10 NOTE — Telephone Encounter (Signed)
Patient called stating that she is going to be flying to [place] on 09-22-11 and would like a sleeping med called in for the plane ride to [place] and home. Pt stated that she has tried Xanax before and this helped. Please advise and send to [facility].

## 2011-09-10 NOTE — Telephone Encounter (Signed)
She can have some Xanax for traveling, Xanax 0.25 mg 1 tab po bid prn anxiety or insomnia, disp #20 no rf

## 2011-09-11 MED ORDER — ALPRAZOLAM 0.25 MG PO TABS: 0.2500 mg | ORAL_TABLET | Freq: Two times a day (BID) | ORAL | Status: DC | PRN

## 2011-09-11 NOTE — Telephone Encounter (Signed)
Pt informed that RX was sent to pharmacy. 

## 2011-12-02 LAB — HM PAP SMEAR

## 2011-12-20 ENCOUNTER — Telehealth: Payer: Self-pay

## 2011-12-20 NOTE — Telephone Encounter (Signed)
Pt left a message stating that she needed her total choletsterol #. I informed pt that last labs were done on 11-13-10 and a lipids panel was not done. Pt asked why it wasn't done and I stated that " I was sorry but didn't have an answer for that", pt stated "well that's annoying" "so I need labs done" I said yes and pt said thank you and hung up.

## 2011-12-21 ENCOUNTER — Other Ambulatory Visit: Payer: Self-pay

## 2011-12-21 MED ORDER — LEVOTHYROXINE SODIUM 88 MCG PO TABS: 88.0000 ug | ORAL_TABLET | Freq: Every day | ORAL | Status: DC

## 2011-12-28 ENCOUNTER — Encounter: Payer: Self-pay | Admitting: Family Medicine

## 2011-12-28 ENCOUNTER — Ambulatory Visit (INDEPENDENT_AMBULATORY_CARE_PROVIDER_SITE_OTHER): Payer: Managed Care, Other (non HMO) | Admitting: Family Medicine

## 2011-12-28 VITALS — BP 122/88 | HR 77 | Temp 97.6°F | Ht 65.75 in | Wt 123.1 lb

## 2011-12-28 DIAGNOSIS — M25519 Pain in unspecified shoulder: Secondary | ICD-10-CM

## 2011-12-28 DIAGNOSIS — E079 Disorder of thyroid, unspecified: Secondary | ICD-10-CM

## 2011-12-28 DIAGNOSIS — I10 Essential (primary) hypertension: Secondary | ICD-10-CM

## 2011-12-28 DIAGNOSIS — E039 Hypothyroidism, unspecified: Secondary | ICD-10-CM

## 2011-12-28 DIAGNOSIS — R7989 Other specified abnormal findings of blood chemistry: Secondary | ICD-10-CM

## 2011-12-28 DIAGNOSIS — R748 Abnormal levels of other serum enzymes: Secondary | ICD-10-CM

## 2011-12-28 DIAGNOSIS — M25511 Pain in right shoulder: Secondary | ICD-10-CM

## 2011-12-28 DIAGNOSIS — Z0000 Encounter for general adult medical examination without abnormal findings: Secondary | ICD-10-CM

## 2011-12-28 LAB — RENAL FUNCTION PANEL
CO2: 26 mEq/L (ref 19–32)
Chloride: 99 mEq/L (ref 96–112)
GFR: 87.99 mL/min (ref >60.00)
Sodium: 135 mEq/L (ref 135–145)

## 2011-12-28 LAB — HEPATIC FUNCTION PANEL
AST: 85 U/L — ABNORMAL HIGH (ref 0–37)
Alkaline Phosphatase: 74 U/L (ref 39–117)
Bilirubin, Direct: 0.1 mg/dL (ref 0.0–0.3)
Total Bilirubin: 1.1 mg/dL (ref 0.3–1.2)

## 2011-12-28 LAB — CBC
RDW: 12.1 % (ref 11.5–14.6)
WBC: 4.8 10*3/uL (ref 4.5–10.5)

## 2011-12-28 LAB — LIPID PANEL
HDL: 85.8 mg/dL (ref >39.00)
Total CHOL/HDL Ratio: 3
VLDL: 12.8 mg/dL (ref 0.0–40.0)

## 2011-12-28 LAB — TSH: TSH: 1.19 u[IU]/mL (ref 0.35–5.50)
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
## 2011-12-28 NOTE — Assessment & Plan Note (Signed)
Recheck TSH today and as long as this is stable we'll given 90 day supply of current dose of levothyroxine and 3 refills.

## 2011-12-28 NOTE — Assessment & Plan Note (Signed)
Encouraged limited transvaginal and simple carbs. Increase exercise. Check LFTs again today an acute hepatitis panel.

## 2011-12-28 NOTE — Assessment & Plan Note (Signed)
Well controlled on recheck and more consistent with what patient sees a home and when she's at the gym. No medications necessary at this time

## 2011-12-28 NOTE — Assessment & Plan Note (Addendum)
Taking Prenatal gummies and doing well. Encouraged ongoing exercise and heart heathy diet. Fasting labs checked today.

## 2011-12-28 NOTE — Patient Instructions (Addendum)
Preventive Care for Adults, Female A healthy lifestyle and preventive care can promote health and wellness. Preventive health guidelines for women include the following key practices.  A routine yearly physical is a good way to check with your caregiver about your health and preventive screening. It is a chance to share any concerns and updates on your health, and to receive a thorough exam.  Visit your dentist for a routine exam and preventive care every 6 months. Brush your teeth twice a day and floss once a day. Good oral hygiene prevents tooth decay and gum disease.  The frequency of eye exams is based on your age, health, family medical history, use of contact lenses, and other factors. Follow your caregiver's recommendations for frequency of eye exams.  Eat a healthy diet. Foods like vegetables, fruits, whole grains, low-fat dairy products, and lean protein foods contain the nutrients you need without too many calories. Decrease your intake of foods high in solid fats, added sugars, and salt. Eat the right amount of calories for you.Get information about a proper diet from your caregiver, if necessary.  Regular physical exercise is one of the most important things you can do for your health. Most adults should get at least 150 minutes of moderate-intensity exercise (any activity that increases your heart rate and causes you to sweat) each week. In addition, most adults need muscle-strengthening exercises on 2 or more days a week.  Maintain a healthy weight. The body mass index (BMI) is a screening tool to identify possible weight problems. It provides an estimate of body fat based on height and weight. Your caregiver can help determine your BMI, and can help you achieve or maintain a healthy weight.For adults 20 years and older:  A BMI below 18.5 is considered underweight.  A BMI of 18.5 to 24.9 is normal.  A BMI of 25 to 29.9 is considered overweight.  A BMI of 30 and above is  considered obese.  Maintain normal blood lipids and cholesterol levels by exercising and minimizing your intake of saturated fat. Eat a balanced diet with plenty of fruit and vegetables. Blood tests for lipids and cholesterol should begin at age 20 and be repeated every 5 years. If your lipid or cholesterol levels are high, you are over 50, or you are at high risk for heart disease, you may need your cholesterol levels checked more frequently.Ongoing high lipid and cholesterol levels should be treated with medicines if diet and exercise are not effective.  If you smoke, find out from your caregiver how to quit. If you do not use tobacco, do not start.  If you are pregnant, do not drink alcohol. If you are breastfeeding, be very cautious about drinking alcohol. If you are not pregnant and choose to drink alcohol, do not exceed 1 drink per day. One drink is considered to be 12 ounces (355 mL) of beer, 5 ounces (148 mL) of wine, or 1.5 ounces (44 mL) of liquor.  Avoid use of street drugs. Do not share needles with anyone. Ask for help if you need support or instructions about stopping the use of drugs.  High blood pressure causes heart disease and increases the risk of stroke. Your blood pressure should be checked at least every 1 to 2 years. Ongoing high blood pressure should be treated with medicines if weight loss and exercise are not effective.  If you are 55 to 35 years old, ask your caregiver if you should take aspirin to prevent strokes.  Diabetes   screening involves taking a blood sample to check your fasting blood sugar level. This should be done once every 3 years, after age 45, if you are within normal weight and without risk factors for diabetes. Testing should be considered at a younger age or be carried out more frequently if you are overweight and have at least 1 risk factor for diabetes.  Breast cancer screening is essential preventive care for women. You should practice "breast  self-awareness." This means understanding the normal appearance and feel of your breasts and may include breast self-examination. Any changes detected, no matter how small, should be reported to a caregiver. Women in their 20s and 30s should have a clinical breast exam (CBE) by a caregiver as part of a regular health exam every 1 to 3 years. After age 40, women should have a CBE every year. Starting at age 40, women should consider having a mammography (breast X-ray test) every year. Women who have a family history of breast cancer should talk to their caregiver about genetic screening. Women at a high risk of breast cancer should talk to their caregivers about having magnetic resonance imaging (MRI) and a mammography every year.  The Pap test is a screening test for cervical cancer. A Pap test can show cell changes on the cervix that might become cervical cancer if left untreated. A Pap test is a procedure in which cells are obtained and examined from the lower end of the uterus (cervix).  Women should have a Pap test starting at age 21.  Between ages 21 and 29, Pap tests should be repeated every 2 years.  Beginning at age 30, you should have a Pap test every 3 years as long as the past 3 Pap tests have been normal.  Some women have medical problems that increase the chance of getting cervical cancer. Talk to your caregiver about these problems. It is especially important to talk to your caregiver if a new problem develops soon after your last Pap test. In these cases, your caregiver may recommend more frequent screening and Pap tests.  The above recommendations are the same for women who have or have not gotten the vaccine for human papillomavirus (HPV).  If you had a hysterectomy for a problem that was not cancer or a condition that could lead to cancer, then you no longer need Pap tests. Even if you no longer need a Pap test, a regular exam is a good idea to make sure no other problems are  starting.  If you are between ages 65 and 70, and you have had normal Pap tests going back 10 years, you no longer need Pap tests. Even if you no longer need a Pap test, a regular exam is a good idea to make sure no other problems are starting.  If you have had past treatment for cervical cancer or a condition that could lead to cancer, you need Pap tests and screening for cancer for at least 20 years after your treatment.  If Pap tests have been discontinued, risk factors (such as a new sexual partner) need to be reassessed to determine if screening should be resumed.  The HPV test is an additional test that may be used for cervical cancer screening. The HPV test looks for the virus that can cause the cell changes on the cervix. The cells collected during the Pap test can be tested for HPV. The HPV test could be used to screen women aged 30 years and older, and should   be used in women of any age who have unclear Pap test results. After the age of 30, women should have HPV testing at the same frequency as a Pap test.  Colorectal cancer can be detected and often prevented. Most routine colorectal cancer screening begins at the age of 50 and continues through age 75. However, your caregiver may recommend screening at an earlier age if you have risk factors for colon cancer. On a yearly basis, your caregiver may provide home test kits to check for hidden blood in the stool. Use of a small camera at the end of a tube, to directly examine the colon (sigmoidoscopy or colonoscopy), can detect the earliest forms of colorectal cancer. Talk to your caregiver about this at age 50, when routine screening begins. Direct examination of the colon should be repeated every 5 to 10 years through age 75, unless early forms of pre-cancerous polyps or small growths are found.  Hepatitis C blood testing is recommended for all people born from 1945 through 1965 and any individual with known risks for hepatitis C.  Practice  safe sex. Use condoms and avoid high-risk sexual practices to reduce the spread of sexually transmitted infections (STIs). STIs include gonorrhea, chlamydia, syphilis, trichomonas, herpes, HPV, and human immunodeficiency virus (HIV). Herpes, HIV, and HPV are viral illnesses that have no cure. They can result in disability, cancer, and death. Sexually active women aged 25 and younger should be checked for chlamydia. Older women with new or multiple partners should also be tested for chlamydia. Testing for other STIs is recommended if you are sexually active and at increased risk.  Osteoporosis is a disease in which the bones lose minerals and strength with aging. This can result in serious bone fractures. The risk of osteoporosis can be identified using a bone density scan. Women ages 65 and over and women at risk for fractures or osteoporosis should discuss screening with their caregivers. Ask your caregiver whether you should take a calcium supplement or vitamin D to reduce the rate of osteoporosis.  Menopause can be associated with physical symptoms and risks. Hormone replacement therapy is available to decrease symptoms and risks. You should talk to your caregiver about whether hormone replacement therapy is right for you.  Use sunscreen with sun protection factor (SPF) of 30 or more. Apply sunscreen liberally and repeatedly throughout the day. You should seek shade when your shadow is shorter than you. Protect yourself by wearing long sleeves, pants, a wide-brimmed hat, and sunglasses year round, whenever you are outdoors.  Once a month, do a whole body skin exam, using a mirror to look at the skin on your back. Notify your caregiver of new moles, moles that have irregular borders, moles that are larger than a pencil eraser, or moles that have changed in shape or color.  Stay current with required immunizations.  Influenza. You need a dose every fall (or winter). The composition of the flu vaccine  changes each year, so being vaccinated once is not enough.  Pneumococcal polysaccharide. You need 1 to 2 doses if you smoke cigarettes or if you have certain chronic medical conditions. You need 1 dose at age 65 (or older) if you have never been vaccinated.  Tetanus, diphtheria, pertussis (Tdap, Td). Get 1 dose of Tdap vaccine if you are younger than age 65, are over 65 and have contact with an infant, are a healthcare worker, are pregnant, or simply want to be protected from whooping cough. After that, you need a Td   booster dose every 10 years. Consult your caregiver if you have not had at least 3 tetanus and diphtheria-containing shots sometime in your life or have a deep or dirty wound.  HPV. You need this vaccine if you are a woman age 26 or younger. The vaccine is given in 3 doses over 6 months.  Measles, mumps, rubella (MMR). You need at least 1 dose of MMR if you were born in 1957 or later. You may also need a second dose.  Meningococcal. If you are age 19 to 21 and a first-year college student living in a residence hall, or have one of several medical conditions, you need to get vaccinated against meningococcal disease. You may also need additional booster doses.  Zoster (shingles). If you are age 60 or older, you should get this vaccine.  Varicella (chickenpox). If you have never had chickenpox or you were vaccinated but received only 1 dose, talk to your caregiver to find out if you need this vaccine.  Hepatitis A. You need this vaccine if you have a specific risk factor for hepatitis A virus infection or you simply wish to be protected from this disease. The vaccine is usually given as 2 doses, 6 to 18 months apart.  Hepatitis B. You need this vaccine if you have a specific risk factor for hepatitis B virus infection or you simply wish to be protected from this disease. The vaccine is given in 3 doses, usually over 6 months. Preventive Services / Frequency Ages 19 to 39  Blood  pressure check.** / Every 1 to 2 years.  Lipid and cholesterol check.** / Every 5 years beginning at age 20.  Clinical breast exam.** / Every 3 years for women in their 20s and 30s.  Pap test.** / Every 2 years from ages 21 through 29. Every 3 years starting at age 30 through age 65 or 70 with a history of 3 consecutive normal Pap tests.  HPV screening.** / Every 3 years from ages 30 through ages 65 to 70 with a history of 3 consecutive normal Pap tests.  Hepatitis C blood test.** / For any individual with known risks for hepatitis C.  Skin self-exam. / Monthly.  Influenza immunization.** / Every year.  Pneumococcal polysaccharide immunization.** / 1 to 2 doses if you smoke cigarettes or if you have certain chronic medical conditions.  Tetanus, diphtheria, pertussis (Tdap, Td) immunization. / A one-time dose of Tdap vaccine. After that, you need a Td booster dose every 10 years.  HPV immunization. / 3 doses over 6 months, if you are 26 and younger.  Measles, mumps, rubella (MMR) immunization. / You need at least 1 dose of MMR if you were born in 1957 or later. You may also need a second dose.  Meningococcal immunization. / 1 dose if you are age 19 to 21 and a first-year college student living in a residence hall, or have one of several medical conditions, you need to get vaccinated against meningococcal disease. You may also need additional booster doses.  Varicella immunization.** / Consult your caregiver.  Hepatitis A immunization.** / Consult your caregiver. 2 doses, 6 to 18 months apart.  Hepatitis B immunization.** / Consult your caregiver. 3 doses usually over 6 months. Ages 40 to 64  Blood pressure check.** / Every 1 to 2 years.  Lipid and cholesterol check.** / Every 5 years beginning at age 20.  Clinical breast exam.** / Every year after age 40.  Mammogram.** / Every year beginning at age 40   and continuing for as long as you are in good health. Consult with your  caregiver.  Pap test.** / Every 3 years starting at age 30 through age 65 or 70 with a history of 3 consecutive normal Pap tests.  HPV screening.** / Every 3 years from ages 30 through ages 65 to 70 with a history of 3 consecutive normal Pap tests.  Fecal occult blood test (FOBT) of stool. / Every year beginning at age 50 and continuing until age 75. You may not need to do this test if you get a colonoscopy every 10 years.  Flexible sigmoidoscopy or colonoscopy.** / Every 5 years for a flexible sigmoidoscopy or every 10 years for a colonoscopy beginning at age 50 and continuing until age 75.  Hepatitis C blood test.** / For all people born from 1945 through 1965 and any individual with known risks for hepatitis C.  Skin self-exam. / Monthly.  Influenza immunization.** / Every year.  Pneumococcal polysaccharide immunization.** / 1 to 2 doses if you smoke cigarettes or if you have certain chronic medical conditions.  Tetanus, diphtheria, pertussis (Tdap, Td) immunization.** / A one-time dose of Tdap vaccine. After that, you need a Td booster dose every 10 years.  Measles, mumps, rubella (MMR) immunization. / You need at least 1 dose of MMR if you were born in 1957 or later. You may also need a second dose.  Varicella immunization.** / Consult your caregiver.  Meningococcal immunization.** / Consult your caregiver.  Hepatitis A immunization.** / Consult your caregiver. 2 doses, 6 to 18 months apart.  Hepatitis B immunization.** / Consult your caregiver. 3 doses, usually over 6 months. Ages 65 and over  Blood pressure check.** / Every 1 to 2 years.  Lipid and cholesterol check.** / Every 5 years beginning at age 20.  Clinical breast exam.** / Every year after age 40.  Mammogram.** / Every year beginning at age 40 and continuing for as long as you are in good health. Consult with your caregiver.  Pap test.** / Every 3 years starting at age 30 through age 65 or 70 with a 3  consecutive normal Pap tests. Testing can be stopped between 65 and 70 with 3 consecutive normal Pap tests and no abnormal Pap or HPV tests in the past 10 years.  HPV screening.** / Every 3 years from ages 30 through ages 65 or 70 with a history of 3 consecutive normal Pap tests. Testing can be stopped between 65 and 70 with 3 consecutive normal Pap tests and no abnormal Pap or HPV tests in the past 10 years.  Fecal occult blood test (FOBT) of stool. / Every year beginning at age 50 and continuing until age 75. You may not need to do this test if you get a colonoscopy every 10 years.  Flexible sigmoidoscopy or colonoscopy.** / Every 5 years for a flexible sigmoidoscopy or every 10 years for a colonoscopy beginning at age 50 and continuing until age 75.  Hepatitis C blood test.** / For all people born from 1945 through 1965 and any individual with known risks for hepatitis C.  Osteoporosis screening.** / A one-time screening for women ages 65 and over and women at risk for fractures or osteoporosis.  Skin self-exam. / Monthly.  Influenza immunization.** / Every year.  Pneumococcal polysaccharide immunization.** / 1 dose at age 65 (or older) if you have never been vaccinated.  Tetanus, diphtheria, pertussis (Tdap, Td) immunization. / A one-time dose of Tdap vaccine if you are over   65 and have contact with an infant, are a healthcare worker, or simply want to be protected from whooping cough. After that, you need a Td booster dose every 10 years.  Varicella immunization.** / Consult your caregiver.  Meningococcal immunization.** / Consult your caregiver.  Hepatitis A immunization.** / Consult your caregiver. 2 doses, 6 to 18 months apart.  Hepatitis B immunization.** / Check with your caregiver. 3 doses, usually over 6 months. ** Family history and personal history of risk and conditions may change your caregiver's recommendations. Document Released: 02/13/2001 Document Revised: 03/12/2011  Document Reviewed: 05/15/2010 [employer] Patient Information 2013 [employer].  

## 2011-12-28 NOTE — Assessment & Plan Note (Signed)
Has had trouble off and on for years recently is bothering her more again it is in the caudal and anterior shoulder. She's tried chiropractic, massage, medications in the past without any long-term benefit. We'll try a course of physical therapy. Is referred today for further intervention.

## 2011-12-31 LAB — HEPATITIS PANEL, ACUTE
Hep B C IgM: NEGATIVE
Hepatitis B Surface Ag: NEGATIVE

## 2012-01-03 NOTE — Progress Notes (Signed)
Quick Note:  FYI: Patient Informed and states she doesn't want to do an abdominal ultrasound would rather wait to check these numbers next year ______

## 2012-01-04 ENCOUNTER — Telehealth: Payer: Self-pay | Admitting: Family Medicine

## 2012-01-04 ENCOUNTER — Other Ambulatory Visit: Payer: Self-pay | Admitting: Family Medicine

## 2012-01-04 DIAGNOSIS — R945 Abnormal results of liver function studies: Secondary | ICD-10-CM

## 2012-01-04 DIAGNOSIS — M25511 Pain in right shoulder: Secondary | ICD-10-CM

## 2012-01-04 NOTE — Telephone Encounter (Signed)
Ordered at [facility]

## 2012-01-04 NOTE — Telephone Encounter (Signed)
Please advise 

## 2012-01-04 NOTE — Telephone Encounter (Signed)
Patient has decided to have the abdominal ultrasound done. She would like to be scheduled at a [facility] location. [facility] is in-network.

## 2012-01-07 ENCOUNTER — Encounter: Payer: Self-pay | Admitting: Family Medicine

## 2012-01-07 ENCOUNTER — Ambulatory Visit (INDEPENDENT_AMBULATORY_CARE_PROVIDER_SITE_OTHER): Payer: Managed Care, Other (non HMO) | Admitting: Family Medicine

## 2012-01-07 VITALS — BP 141/95 | HR 72 | Temp 98.2°F | Ht 65.75 in | Wt 125.8 lb

## 2012-01-07 DIAGNOSIS — M25511 Pain in right shoulder: Secondary | ICD-10-CM

## 2012-01-07 DIAGNOSIS — H6690 Otitis media, unspecified, unspecified ear: Secondary | ICD-10-CM

## 2012-01-07 DIAGNOSIS — M25519 Pain in unspecified shoulder: Secondary | ICD-10-CM

## 2012-01-07 DIAGNOSIS — J329 Chronic sinusitis, unspecified: Secondary | ICD-10-CM

## 2012-01-07 DIAGNOSIS — I10 Essential (primary) hypertension: Secondary | ICD-10-CM

## 2012-01-07 HISTORY — DX: Chronic sinusitis, unspecified: J32.9

## 2012-01-07 MED ORDER — CIPROFLOXACIN HCL 500 MG PO TABS: 500.0000 mg | ORAL_TABLET | Freq: Two times a day (BID) | ORAL | Status: DC

## 2012-01-07 MED ORDER — GUAIFENESIN ER 600 MG PO TB12: 600.0000 mg | ORAL_TABLET | Freq: Two times a day (BID) | ORAL | Status: DC

## 2012-01-07 NOTE — Telephone Encounter (Signed)
Patient scheduled 01/18/12

## 2012-01-07 NOTE — Assessment & Plan Note (Signed)
Elevated with acute illness, will continue to monitor

## 2012-01-07 NOTE — Assessment & Plan Note (Signed)
Started on ciprofloxacin, mucinex, increase rest and clear fluids
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
## 2012-01-07 NOTE — Assessment & Plan Note (Signed)
Persistent. Will continue to work on PT referral

## 2012-01-07 NOTE — Patient Instructions (Addendum)

## 2012-01-09 ENCOUNTER — Other Ambulatory Visit: Payer: Managed Care, Other (non HMO)

## 2012-01-18 ENCOUNTER — Other Ambulatory Visit: Payer: Managed Care, Other (non HMO)

## 2012-01-18 ENCOUNTER — Encounter: Payer: Self-pay | Admitting: Family Medicine

## 2012-01-18 ENCOUNTER — Ambulatory Visit (INDEPENDENT_AMBULATORY_CARE_PROVIDER_SITE_OTHER): Payer: Managed Care, Other (non HMO) | Admitting: Family Medicine

## 2012-01-18 VITALS — BP 133/93 | HR 73 | Temp 98.0°F | Ht 65.75 in | Wt 126.8 lb

## 2012-01-18 DIAGNOSIS — I10 Essential (primary) hypertension: Secondary | ICD-10-CM

## 2012-01-18 DIAGNOSIS — M542 Cervicalgia: Secondary | ICD-10-CM

## 2012-01-18 DIAGNOSIS — I889 Nonspecific lymphadenitis, unspecified: Secondary | ICD-10-CM

## 2012-01-18 DIAGNOSIS — J329 Chronic sinusitis, unspecified: Secondary | ICD-10-CM

## 2012-01-18 MED ORDER — CLINDAMYCIN HCL 300 MG PO CAPS: 300.0000 mg | ORAL_CAPSULE | Freq: Three times a day (TID) | ORAL | Status: DC

## 2012-01-18 NOTE — Patient Instructions (Addendum)
Start Digestive [employer]age probiotic and MegaRed krill oil by Schiff

## 2012-01-20 ENCOUNTER — Encounter: Payer: Self-pay | Admitting: Family Medicine

## 2012-01-20 DIAGNOSIS — M542 Cervicalgia: Secondary | ICD-10-CM | POA: Insufficient documentation

## 2012-01-20 HISTORY — DX: Cervicalgia: M54.2

## 2012-01-20 NOTE — Assessment & Plan Note (Signed)
Adequate control today despite feeling poorly

## 2012-01-20 NOTE — Assessment & Plan Note (Signed)
Proceed with xray and probable MRI due to worsening neck pain worse on right vs left, she is now developing some radicularly symptoms right but now also left. Is having some spasm and palpable lesions on right side of neck as well

## 2012-01-20 NOTE — Assessment & Plan Note (Signed)
Right sided lymphadenitis.. Switched to Clindamycin and referred to ENT for further consideration.
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
## 2012-01-21 ENCOUNTER — Ambulatory Visit (HOSPITAL_BASED_OUTPATIENT_CLINIC_OR_DEPARTMENT_OTHER)
Admission: RE | Admit: 2012-01-21 | Discharge: 2012-01-21 | Disposition: A | Discharge status: Home / Self Care | Payer: Managed Care, Other (non HMO) | Source: Ambulatory Visit | Attending: Family Medicine | Admitting: Family Medicine

## 2012-01-21 DIAGNOSIS — M5030 Other cervical disc degeneration, unspecified cervical region: Secondary | ICD-10-CM | POA: Insufficient documentation

## 2012-01-21 DIAGNOSIS — M542 Cervicalgia: Secondary | ICD-10-CM | POA: Insufficient documentation

## 2012-01-22 ENCOUNTER — Telehealth: Payer: Self-pay | Admitting: Family Medicine

## 2012-01-22 ENCOUNTER — Other Ambulatory Visit: Payer: Self-pay | Admitting: Family Medicine

## 2012-01-22 DIAGNOSIS — M5410 Radiculopathy, site unspecified: Secondary | ICD-10-CM

## 2012-01-22 DIAGNOSIS — M542 Cervicalgia: Secondary | ICD-10-CM

## 2012-01-22 NOTE — Telephone Encounter (Signed)
error 

## 2012-01-22 NOTE — Progress Notes (Signed)
Already ordered

## 2012-01-24 ENCOUNTER — Other Ambulatory Visit: Payer: Self-pay | Admitting: Family Medicine

## 2012-01-25 ENCOUNTER — Telehealth: Payer: Self-pay | Admitting: Family Medicine

## 2012-01-25 ENCOUNTER — Other Ambulatory Visit: Payer: Managed Care, Other (non HMO)

## 2012-01-25 MED ORDER — LEVOTHYROXINE SODIUM 88 MCG PO TABS: 88.0000 ug | ORAL_TABLET | Freq: Every day | ORAL | Status: DC

## 2012-01-25 NOTE — Telephone Encounter (Signed)
[facility] 

## 2012-01-25 NOTE — Telephone Encounter (Signed)
Pharmacy comments:  Levothyroxine tab  Need 90 day supply on Rx for insurance purpose

## 2012-01-26 ENCOUNTER — Other Ambulatory Visit: Payer: Managed Care, Other (non HMO)

## 2012-01-28 ENCOUNTER — Other Ambulatory Visit: Payer: Managed Care, Other (non HMO)

## 2012-04-26 ENCOUNTER — Other Ambulatory Visit: Payer: Self-pay | Admitting: Family Medicine

## 2012-04-29 ENCOUNTER — Other Ambulatory Visit: Payer: Self-pay

## 2012-04-29 MED ORDER — LEVOTHYROXINE SODIUM 88 MCG PO TABS: 88.0000 ug | ORAL_TABLET | Freq: Every day | ORAL | Status: DC

## 2012-10-26 ENCOUNTER — Other Ambulatory Visit: Payer: Self-pay | Admitting: Family Medicine

## 2012-10-26 DIAGNOSIS — Z0000 Encounter for general adult medical examination without abnormal findings: Secondary | ICD-10-CM

## 2012-10-27 NOTE — Telephone Encounter (Signed)
Good question she is due for annual labs in December, have her come in for fasting labs and annual exam late December early January. Can have enough Levothyroxine to get through til then

## 2012-10-27 NOTE — Telephone Encounter (Signed)
Pt last seen in January. Levothyroxine refill sent to pharmacy. Pt has no future appts scheduled. When should pt follow up again?

## 2012-10-27 NOTE — Telephone Encounter (Signed)
Scheduled physical for 12/30/12 at 1:30pm.  Lab orders entered.

## 2012-11-03 ENCOUNTER — Ambulatory Visit (INDEPENDENT_AMBULATORY_CARE_PROVIDER_SITE_OTHER): Payer: Managed Care, Other (non HMO) | Admitting: Family Medicine

## 2012-11-03 ENCOUNTER — Encounter: Payer: Self-pay | Admitting: Family Medicine

## 2012-11-03 ENCOUNTER — Other Ambulatory Visit: Payer: Self-pay | Admitting: Family Medicine

## 2012-11-03 ENCOUNTER — Telehealth: Payer: Self-pay | Admitting: Family Medicine

## 2012-11-03 VITALS — BP 143/102 | HR 76 | Temp 99.4°F | Resp 16 | Ht 65.75 in | Wt 123.0 lb

## 2012-11-03 DIAGNOSIS — M5412 Radiculopathy, cervical region: Secondary | ICD-10-CM

## 2012-11-03 DIAGNOSIS — M542 Cervicalgia: Secondary | ICD-10-CM

## 2012-11-03 DIAGNOSIS — M25511 Pain in right shoulder: Secondary | ICD-10-CM

## 2012-11-03 DIAGNOSIS — I10 Essential (primary) hypertension: Secondary | ICD-10-CM

## 2012-11-03 DIAGNOSIS — M5410 Radiculopathy, site unspecified: Secondary | ICD-10-CM

## 2012-11-03 DIAGNOSIS — M25519 Pain in unspecified shoulder: Secondary | ICD-10-CM

## 2012-11-03 DIAGNOSIS — R197 Diarrhea, unspecified: Secondary | ICD-10-CM | POA: Insufficient documentation

## 2012-11-03 DIAGNOSIS — R7989 Other specified abnormal findings of blood chemistry: Secondary | ICD-10-CM

## 2012-11-03 DIAGNOSIS — R945 Abnormal results of liver function studies: Secondary | ICD-10-CM
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
## 2012-11-03 NOTE — Telephone Encounter (Signed)
Patient Information:  Caller Name: Dione  Phone: [phone]  Patient: Marisa Contreras, Marisa Contreras  Gender: Female  DOB: 1975/05/09  Age: 37 Years  PCP: Danise Edge [facility])  Pregnant: No  Office Follow Up:  Does the office need to follow up with this patient?: No  Instructions For The Office: N/A  RN Note:  Patient states she traveled to [place] 10/11/12-11/23/12. Patient states she developed diarrhea, onset 10/23/12. Patient states she had a travelers kit and took Zithromax from 11/29/12-11/31/14 without improvement of diarrhea. Patient complains of nausea, no vomiting. Patient eating and drinking well. Urinating normally for patient. Patient states approx. 10 loose bowel movements/day accompanied by abdominal cramping. Describes as watery, yellow to dark brown/green in color. Care advice and diet advice given per guidelines. Call back parameters reviewed. Patient verbalizes understanding.  Symptoms  Reason For Call & Symptoms: Diarrhea  Reviewed Health History In EMR: Yes  Reviewed Medications In EMR: Yes  Reviewed Allergies In EMR: Yes  Reviewed Surgeries / Procedures: Yes  Date of Onset of Symptoms: 10/23/2012  Treatments Tried: Zithromax  Treatments Tried Worked: No OB / GYN:  LMP: 10/01/2012  Guideline(s) Used:  Diarrhea  Disposition Per Guideline:   Callback by PCP Today  Reason For Disposition Reached:   Travel to a foreign country in past month  Advice Given:  Nutrition:  Maintaining some food intake during episodes of diarrhea is important.  Ideal initial foods include boiled starches/cereals (e.g., potatoes, rice, noodles, wheat, oats) with a small amount of salt to taste.  Other acceptable foods include: bananas, yogurt, crackers, soup.  Call Back If:  Signs of dehydration occur (e.g., no urine for more than 12 hours, very dry mouth, lightheaded, etc.)  You become worse.  RN Overrode Recommendation:  Make Appointment  Schedule appt.  Appointment  Scheduled:  11/03/2012 11:15:00 Appointment Scheduled Provider:  Earley F[facility])
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
## 2012-11-04 LAB — GIARDIA/CRYPTOSPORIDIUM (EIA): Cryptosporidium Screen (EIA): NEGATIVE

## 2012-11-04 LAB — ROTAVIRUS ANTIGEN, STOOL: Rotavirus: NEGATIVE

## 2012-11-05 ENCOUNTER — Other Ambulatory Visit: Payer: Self-pay | Admitting: Family Medicine

## 2012-11-05 ENCOUNTER — Telehealth: Payer: Self-pay | Admitting: Family Medicine

## 2012-11-05 LAB — CLOSTRIDIUM DIFFICILE BY PCR: Toxigenic C. Difficile by PCR: NOT DETECTED

## 2012-11-05 MED ORDER — LABETALOL HCL 100 MG PO TABS: 100.0000 mg | ORAL_TABLET | Freq: Two times a day (BID) | ORAL | Status: DC

## 2012-11-05 MED ORDER — METRONIDAZOLE 500 MG PO TABS: ORAL_TABLET | ORAL | Status: DC

## 2012-11-05 NOTE — Telephone Encounter (Signed)
Patient would like to be put back on a BP medication.  She states her BP has been reading 140/100 and her HR has been in the upper 80's - low 90's.   Patient also states that the diarrhea symptoms are unchanged.  [facility] [address].  Please advise.

## 2012-11-05 NOTE — Telephone Encounter (Signed)
Patient aware and states that she has an appointment with Dr. Abner Greenspan in December.

## 2012-11-05 NOTE — Telephone Encounter (Signed)
OK. Labetalol 100mg , 1 tab bid, #60, RF x 1 (pt is trying to get pregnant) eRx'd to her pharmacy, emphasized need to have routine HTN f/u in about a month with Dr. Abner Greenspan. Also, stool studies unremarkable at this time: C diff and culture pending. Will do empiric flagyl 500mg  tid x 14d at this time.  Will continue to follow final c diff and stool culture results.

## 2012-11-07 LAB — STOOL CULTURE

## 2012-11-13 DIAGNOSIS — I10 Essential (primary) hypertension: Secondary | ICD-10-CM | POA: Insufficient documentation

## 2012-11-13 NOTE — Assessment & Plan Note (Signed)
Decided to go ahead and start med today.  She is attempting to get pregnant so will rx a bp med know to be ok in prenancy: labetalol 100 mg bid.

## 2012-11-13 NOTE — Telephone Encounter (Signed)
I recommend pt stop her bp med (labetalol) and monitor bp.  If bp climbs up above 140 over 90 again then she may restart the med at 1/2 tab twice daily instead of a whole tab twice a day.--thx

## 2012-11-14 NOTE — Telephone Encounter (Signed)
Left detailed message on patients cell phone

## 2012-12-23 ENCOUNTER — Telehealth: Payer: Self-pay

## 2012-12-23 ENCOUNTER — Other Ambulatory Visit: Payer: Managed Care, Other (non HMO)

## 2012-12-23 DIAGNOSIS — E079 Disorder of thyroid, unspecified: Secondary | ICD-10-CM

## 2012-12-23 DIAGNOSIS — I10 Essential (primary) hypertension: Secondary | ICD-10-CM

## 2012-12-23 DIAGNOSIS — Z0000 Encounter for general adult medical examination without abnormal findings: Secondary | ICD-10-CM

## 2012-12-23 NOTE — Telephone Encounter (Signed)
Message copied by Court Joy on Tue Dec 23, 2012 12:27 PM ------      Message from: Carmelia Bake      Created: Tue Dec 23, 2012  8:40 AM       Good morning! Marisa Contreras is coming in today for her CPE labs. The orders are in but [place] can't pull them up as clinic collect. Do you know how to change these orders so that she can see them? Thanks ------

## 2012-12-24 ENCOUNTER — Other Ambulatory Visit (INDEPENDENT_AMBULATORY_CARE_PROVIDER_SITE_OTHER): Payer: Managed Care, Other (non HMO)

## 2012-12-24 DIAGNOSIS — E079 Disorder of thyroid, unspecified: Secondary | ICD-10-CM

## 2012-12-24 DIAGNOSIS — R894 Abnormal immunological findings in specimens from other organs, systems and tissues: Secondary | ICD-10-CM

## 2012-12-24 DIAGNOSIS — I10 Essential (primary) hypertension: Secondary | ICD-10-CM

## 2012-12-24 LAB — HEPATIC FUNCTION PANEL
ALT: 43 U/L — ABNORMAL HIGH (ref 0–35)
AST: 50 U/L — ABNORMAL HIGH (ref 0–37)
Albumin: 4.4 g/dL (ref 3.5–5.2)
Total Bilirubin: 0.4 mg/dL (ref 0.3–1.2)
Total Protein: 7.8 g/dL (ref 6.0–8.3)

## 2012-12-24 LAB — RENAL FUNCTION PANEL
Albumin: 4.4 g/dL (ref 3.5–5.2)
BUN: 11 mg/dL (ref 6–23)
CO2: 28 mEq/L (ref 19–32)
Calcium: 8.4 mg/dL (ref 8.4–10.5)
Creatinine, Ser: 0.8 mg/dL (ref 0.4–1.2)

## 2012-12-24 LAB — CBC
HCT: 39.2 % (ref 36.0–46.0)
Hemoglobin: 13.2 g/dL (ref 12.0–15.0)
MCHC: 33.8 g/dL (ref 30.0–36.0)
MCV: 89.9 fl (ref 78.0–100.0)
RBC: 4.36 Mil/uL (ref 3.87–5.11)
RDW: 13.1 % (ref 11.5–14.6)
WBC: 4 10*3/uL — ABNORMAL LOW (ref 4.5–10.5)

## 2012-12-24 LAB — BASIC METABOLIC PANEL
Calcium: 8.4 mg/dL (ref 8.4–10.5)
Chloride: 101 mEq/L (ref 96–112)
GFR: 86.23 mL/min (ref >60.00)
Sodium: 137 mEq/L (ref 135–145)

## 2012-12-24 LAB — LIPID PANEL
Cholesterol: 224 mg/dL — ABNORMAL HIGH (ref 0–200)
Triglycerides: 211 mg/dL — ABNORMAL HIGH (ref 0.0–149.0)

## 2012-12-24 LAB — LDL CHOLESTEROL, DIRECT: Direct LDL: 122.1 mg/dL

## 2012-12-30 ENCOUNTER — Encounter: Payer: Self-pay | Admitting: Family Medicine

## 2012-12-30 ENCOUNTER — Ambulatory Visit (INDEPENDENT_AMBULATORY_CARE_PROVIDER_SITE_OTHER): Payer: Managed Care, Other (non HMO) | Admitting: Family Medicine

## 2012-12-30 VITALS — BP 150/88 | HR 63 | Temp 98.2°F | Ht 65.75 in | Wt 127.0 lb

## 2012-12-30 DIAGNOSIS — E039 Hypothyroidism, unspecified: Secondary | ICD-10-CM

## 2012-12-30 DIAGNOSIS — I10 Essential (primary) hypertension: Secondary | ICD-10-CM

## 2012-12-30 DIAGNOSIS — R7989 Other specified abnormal findings of blood chemistry: Secondary | ICD-10-CM

## 2012-12-30 DIAGNOSIS — Z0000 Encounter for general adult medical examination without abnormal findings: Secondary | ICD-10-CM

## 2012-12-30 MED ORDER — LEVOTHYROXINE SODIUM 88 MCG PO TABS: 88.0000 ug | ORAL_TABLET | Freq: Every day | ORAL | Status: DC

## 2012-12-30 MED ORDER — PROPRANOLOL HCL 10 MG PO TABS: 10.0000 mg | ORAL_TABLET | Freq: Two times a day (BID) | ORAL | Status: DC

## 2012-12-30 NOTE — Progress Notes (Signed)
Pre visit review using our clinic review tool, if applicable. No additional management support is needed unless otherwise documented below in the visit note. 

## 2012-12-30 NOTE — Patient Instructions (Addendum)
[employer] Krill oil caps daily MegaRed krill oil caps daily ACOG Consider flu shot   Hypertension As your heart beats, it forces blood through your arteries. This force is your blood pressure. If the pressure is too high, it is called hypertension (HTN) or high blood pressure. HTN is dangerous because you may have it and not know it. High blood pressure may mean that your heart has to work harder to pump blood. Your arteries may be narrow or stiff. The extra work puts you at risk for heart disease, stroke, and other problems.  Blood pressure consists of two numbers, a higher number over a lower, 110/72, for example. It is stated as "110 over 72." The ideal is below 120 for the top number (systolic) and under 80 for the bottom (diastolic). Write down your blood pressure today. You should pay close attention to your blood pressure if you have certain conditions such as:  Heart failure.  Prior heart attack.  Diabetes  Chronic kidney disease.  Prior stroke.  Multiple risk factors for heart disease. To see if you have HTN, your blood pressure should be measured while you are seated with your arm held at the level of the heart. It should be measured at least twice. A one-time elevated blood pressure reading (especially in the Emergency Department) does not mean that you need treatment. There may be conditions in which the blood pressure is different between your right and left arms. It is important to see your caregiver soon for a recheck. Most people have essential hypertension which means that there is not a specific cause. This type of high blood pressure may be lowered by changing lifestyle factors such as:  Stress.  Smoking.  Lack of exercise.  Excessive weight.  Drug/tobacco/alcohol use.  Eating less salt. Most people do not have symptoms from high blood pressure until it has caused damage to the body. Effective treatment can often prevent, delay or reduce that  damage. TREATMENT  When a cause has been identified, treatment for high blood pressure is directed at the cause. There are a large number of medications to treat HTN. These fall into several categories, and your caregiver will help you select the medicines that are best for you. Medications may have side effects. You should review side effects with your caregiver. If your blood pressure stays high after you have made lifestyle changes or started on medicines,   Your medication(s) may need to be changed.  Other problems may need to be addressed.  Be certain you understand your prescriptions, and know how and when to take your medicine.  Be sure to follow up with your caregiver within the time frame advised (usually within two weeks) to have your blood pressure rechecked and to review your medications.  If you are taking more than one medicine to lower your blood pressure, make sure you know how and at what times they should be taken. Taking two medicines at the same time can result in blood pressure that is too low. SEEK IMMEDIATE MEDICAL CARE IF:  You develop a severe headache, blurred or changing vision, or confusion.  You have unusual weakness or numbness, or a faint feeling.  You have severe chest or abdominal pain, vomiting, or breathing problems. MAKE SURE YOU:   Understand these instructions.  Will watch your condition.  Will get help right away if you are not doing well or get worse. Document Released: 12/18/2004 Document Revised: 03/12/2011 Document Reviewed: 08/08/2007 ExitCare Patient Information 2014 [employer]  

## 2013-01-01 NOTE — L&D Delivery Note (Signed)
Operative Delivery Note At 6:59 PM a viable female was delivered via .  Presentation: vertex; Position: Right,, Occiput,, Anterior; Station: +3.  Verbal consent: obtained from patient.  Risks and benefits discussed in detail.  Risks include, but are not limited to the risks of anesthesia, bleeding, infection, damage to maternal tissues, fetal cephalhematoma.  There is also the risk of inability to effect vaginal delivery of the head, or shoulder dystocia that cannot be resolved by established maneuvers, leading to the need for emergency cesarean section.  APGAR: 9 10  Placenta status: spontaneously with 3 vessel cord, .   Cord:  with the following complications:tight nuchal x 1 .  Cord pH: not obtained  Anesthesia:  epidural Instruments: mushroom Episiotomy: none Lacerations: first Suture Repair: 3.0 chromic Est. Blood Loss (mL): 300  Mom to postpartum.  Baby to Couplet care / Skin to Skin.  Trejan Buda L 10/17/2013, 7:11 PM

## 2013-01-05 ENCOUNTER — Encounter: Payer: Self-pay | Admitting: Family Medicine

## 2013-01-05 ENCOUNTER — Telehealth: Payer: Self-pay | Admitting: Family Medicine

## 2013-01-05 DIAGNOSIS — E039 Hypothyroidism, unspecified: Secondary | ICD-10-CM | POA: Insufficient documentation

## 2013-01-05 DIAGNOSIS — I10 Essential (primary) hypertension: Secondary | ICD-10-CM

## 2013-01-05 NOTE — Assessment & Plan Note (Signed)
Mild encouraged DASH diet and regular exercise

## 2013-01-05 NOTE — Assessment & Plan Note (Signed)
Reviewed labs, encouraged heart healthy diet, regular exercise and sleep.

## 2013-01-05 NOTE — Telephone Encounter (Signed)
WHEN PATIENT WAS IN TO SEE DR B LAST THEY TALKED ABOUT A BETA BLOCKER As HER BP IS UP AND DOWN AND ALL OVER THE PLACE.  SHE DOES NOT HAVE AN RX AT HER PHARMACY

## 2013-01-05 NOTE — Assessment & Plan Note (Signed)
Well treated on Levothyroxine 

## 2013-01-05 NOTE — Progress Notes (Signed)
Patient ID: Marisa Contreras, female   DOB: 1975/01/29, 38 y.o.   MRN: [id number] Marisa Contreras [id number] 1975-12-10 01/05/2013      Progress Note-Follow Up  Subjective  Chief Complaint  Chief Complaint  Patient presents with  . 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 She was seen recently by her GYN and they stopped her beta blocker if for some reason. She has had increasing headaches since. No fevers or chills. She does have some minor nasal congestion. No ear pain, sore throat. Patient denies chest pain, palpitations and shortness of breath.  Past Medical History  Diagnosis Date  . Chicken pox 09/08/2010  . Second degree burn of arm 09/12/2010  . Hypertension   . Thyroid disease   . Neck pain, acute 12/11/2010  . Visit for gynecologic examination 12/11/2010  . Headache(784.0) 12/11/2010  . Elevated liver function tests 04/16/2011  . Preventative health care 12/11/2010    Had Tetanus in 2007 per patient   . Shoulder pain, right 12/11/2010  . Sinusitis 01/07/2012  . Neck pain 01/20/2012    Past Surgical History  Procedure Laterality Date  . Trunk skin lesion excisional biopsy      abd, benign    Family History  Problem Relation Age of Onset  . Hypertension Father   . Diabetes Father   . Thyroid disease Maternal Grandmother   . Hyperlipidemia Maternal Grandmother   . Heart disease Maternal Grandfather   . Alcohol abuse Paternal Grandmother   . Heart attack Paternal Grandfather   . Heart disease Paternal Grandfather     History   Social History  . Marital Status: Married    Spouse Name: N/A    Number of Children: N/A  . Years of Education: N/A   Occupational History  . Not on file.   Social History Main Topics  . Smoking status: Former Smoker    Types: Cigarettes    Quit date: 01/01/2006  . Smokeless  tobacco: Never Used  . Alcohol Use: Yes     Comment: 2 bottle of wine a week  . Drug Use: No  . Sexual Activity: Yes    Partners: Male   Other Topics Concern  . Not on file   Social History Narrative  . No narrative on file    No current outpatient prescriptions on file prior to visit.   No current facility-administered medications on file prior to visit.    No Known Allergies  Review of Systems  Review of Systems  Constitutional: Negative for fever, chills and malaise/fatigue.  HENT: Negative for congestion, hearing loss and nosebleeds.   Eyes: Negative for discharge.  Respiratory: Negative for cough, sputum production, shortness of breath and wheezing.   Cardiovascular: Negative for chest pain, palpitations and leg swelling.  Gastrointestinal: Negative for heartburn, nausea, vomiting, abdominal pain, diarrhea, constipation and blood in stool.  Genitourinary: Negative for dysuria, urgency, frequency and hematuria.  Musculoskeletal: Negative for back pain, falls and myalgias.  Skin: Negative for rash.  Neurological: Negative for dizziness, tremors, sensory change, focal weakness, loss of consciousness, weakness and headaches.  Endo/Heme/Allergies: Negative for polydipsia. Does not bruise/bleed easily.  Psychiatric/Behavioral: Negative for depression and suicidal ideas. The patient is not nervous/anxious and does not have insomnia.     Objective  BP 150/88  Pulse 63  Temp(Src) 98.2  F (36.8 C) (Oral)  Ht 5' 5.75" (1.67 m)  Wt 127 lb (57.607 kg)  BMI 20.66 kg/m2  SpO2 98%  Physical Exam  Physical Exam  Constitutional: She is oriented to person, place, and time and well-developed, well-nourished, and in no distress. No distress.  HENT:  Head: Normocephalic and atraumatic.  Right Ear: External ear normal.  Left Ear: External ear normal.  Nose: Nose normal.  Mouth/Throat: Oropharynx is clear and moist. No oropharyngeal exudate.  Eyes: Conjunctivae are normal.  Pupils are equal, round, and reactive to light. Right eye exhibits no discharge. Left eye exhibits no discharge. No scleral icterus.  Neck: Normal range of motion. Neck supple. No thyromegaly present.  Cardiovascular: Normal rate, regular rhythm and intact distal pulses.  Exam reveals no gallop.   No murmur heard. Pulmonary/Chest: Effort normal and breath sounds normal. No respiratory distress. She has no wheezes. She has no rales.  Abdominal: Soft. Bowel sounds are normal. She exhibits no distension and no mass. There is no tenderness.  Musculoskeletal: Normal range of motion. She exhibits no edema and no tenderness.  Lymphadenopathy:    She has no cervical adenopathy.  Neurological: She is alert and oriented to person, place, and time. She has normal reflexes. No cranial nerve deficit. Coordination normal.  Skin: Skin is warm and dry. No rash noted. She is not diaphoretic.  Psychiatric: Mood, memory and affect normal.    Lab Results  Component Value Date   TSH 0.39 12/24/2012   Lab Results  Component Value Date   WBC 4.0* 12/24/2012   HGB 13.2 12/24/2012   HCT 39.2 12/24/2012   MCV 89.9 12/24/2012   PLT 195.0 12/24/2012   Lab Results  Component Value Date   CREATININE 0.8 12/24/2012   CREATININE 0.8 12/24/2012   BUN 11 12/24/2012   BUN 11 12/24/2012   NA 137 12/24/2012   NA 137 12/24/2012   K 4.0 12/24/2012   K 4.0 12/24/2012   CL 101 12/24/2012   CL 101 12/24/2012   CO2 28 12/24/2012   CO2 28 12/24/2012   Lab Results  Component Value Date   ALT 43* 12/24/2012   AST 50* 12/24/2012   ALKPHOS 74 12/24/2012   BILITOT 0.4 12/24/2012   Lab Results  Component Value Date   CHOL 224* 12/24/2012   Lab Results  Component Value Date   HDL 83.30 12/24/2012   No results found for this basename: LDLCALC   Lab Results  Component Value Date   TRIG 211.0* 12/24/2012   Lab Results  Component Value Date   CHOLHDL 3 12/24/2012     Assessment & Plan  HTN  (hypertension), benign Started on Propranolol bid, consider DASH diet  Unspecified hypothyroidism Well treated on Levothyroxine  Preventative health care Reviewed labs, encouraged heart healthy diet, regular exercise and sleep.  Elevated liver function tests Mild encouraged DASH diet and regular exercise

## 2013-01-05 NOTE — Assessment & Plan Note (Signed)
Started on Propranolol bid, consider DASH diet

## 2013-01-06 NOTE — Telephone Encounter (Signed)
I called the pharmacy and the pharmacist states they filled the Levothyroxine and Propanolol on the 30th and its ready to be picked up.  Left a detailed message on patients vm

## 2013-03-05 LAB — OB RESULTS CONSOLE ABO/RH: RH Type: POSITIVE

## 2013-03-05 LAB — OB RESULTS CONSOLE ANTIBODY SCREEN: ANTIBODY SCREEN: NEGATIVE

## 2013-03-05 LAB — OB RESULTS CONSOLE GC/CHLAMYDIA
CHLAMYDIA, DNA PROBE: NEGATIVE
Gonorrhea: NEGATIVE

## 2013-03-05 LAB — OB RESULTS CONSOLE RPR: RPR: NONREACTIVE

## 2013-03-05 LAB — OB RESULTS CONSOLE RUBELLA ANTIBODY, IGM: Rubella: IMMUNE

## 2013-03-05 LAB — OB RESULTS CONSOLE HIV ANTIBODY (ROUTINE TESTING): HIV: NONREACTIVE

## 2013-03-05 LAB — OB RESULTS CONSOLE HEPATITIS B SURFACE ANTIGEN: Hepatitis B Surface Ag: NEGATIVE

## 2013-03-11 ENCOUNTER — Telehealth: Payer: Self-pay | Admitting: Family Medicine

## 2013-03-11 DIAGNOSIS — I10 Essential (primary) hypertension: Secondary | ICD-10-CM

## 2013-03-11 NOTE — Telephone Encounter (Signed)
90 day supply request  propranolol

## 2013-03-13 MED ORDER — PROPRANOLOL HCL 10 MG PO TABS: 10.0000 mg | ORAL_TABLET | Freq: Two times a day (BID) | ORAL | Status: DC

## 2013-09-09 LAB — OB RESULTS CONSOLE GBS
GBS: POSITIVE
STREP GROUP B AG: POSITIVE

## 2013-10-14 ENCOUNTER — Encounter (HOSPITAL_COMMUNITY): Payer: Self-pay | Admitting: *Deleted

## 2013-10-14 ENCOUNTER — Telehealth (HOSPITAL_COMMUNITY): Payer: Self-pay | Admitting: *Deleted

## 2013-10-14 NOTE — Telephone Encounter (Signed)
Preadmission screen  

## 2013-10-15 ENCOUNTER — Inpatient Hospital Stay (HOSPITAL_COMMUNITY): Admission: RE | Admit: 2013-10-15 | Payer: Managed Care, Other (non HMO) | Source: Ambulatory Visit

## 2013-10-17 ENCOUNTER — Encounter (HOSPITAL_COMMUNITY): Payer: Managed Care, Other (non HMO) | Admitting: Anesthesiology

## 2013-10-17 ENCOUNTER — Encounter (HOSPITAL_COMMUNITY): Payer: Self-pay

## 2013-10-17 ENCOUNTER — Inpatient Hospital Stay (HOSPITAL_COMMUNITY)
Admission: AD | Admit: 2013-10-17 | Discharge: 2013-10-19 | DRG: 774 | Disposition: A | Discharge status: Home / Self Care | Payer: Managed Care, Other (non HMO) | Source: Ambulatory Visit | Attending: Obstetrics and Gynecology | Admitting: Obstetrics and Gynecology

## 2013-10-17 ENCOUNTER — Inpatient Hospital Stay (HOSPITAL_COMMUNITY): Payer: Managed Care, Other (non HMO) | Admitting: Anesthesiology

## 2013-10-17 DIAGNOSIS — O99284 Endocrine, nutritional and metabolic diseases complicating childbirth: Secondary | ICD-10-CM | POA: Diagnosis present

## 2013-10-17 DIAGNOSIS — E039 Hypothyroidism, unspecified: Secondary | ICD-10-CM | POA: Diagnosis present

## 2013-10-17 DIAGNOSIS — O99820 Streptococcus B carrier state complicating pregnancy: Secondary | ICD-10-CM | POA: Diagnosis present

## 2013-10-17 DIAGNOSIS — O480 Post-term pregnancy: Secondary | ICD-10-CM | POA: Diagnosis present

## 2013-10-17 DIAGNOSIS — Z3A41 41 weeks gestation of pregnancy: Secondary | ICD-10-CM | POA: Diagnosis present

## 2013-10-17 DIAGNOSIS — O09513 Supervision of elderly primigravida, third trimester: Secondary | ICD-10-CM

## 2013-10-17 DIAGNOSIS — O163 Unspecified maternal hypertension, third trimester: Secondary | ICD-10-CM | POA: Diagnosis present

## 2013-10-17 DIAGNOSIS — IMO0001 Reserved for inherently not codable concepts without codable children: Secondary | ICD-10-CM

## 2013-10-17 LAB — CBC
HCT: 31.4 % — ABNORMAL LOW (ref 36.0–46.0)
Hemoglobin: 11.1 g/dL — ABNORMAL LOW (ref 12.0–15.0)
MCH: 30.6 pg (ref 26.0–34.0)
MCHC: 35.4 g/dL (ref 30.0–36.0)
MCV: 86.5 fL (ref 78.0–100.0)
PLATELETS: 160 10*3/uL (ref 150–400)
RBC: 3.63 MIL/uL — ABNORMAL LOW (ref 3.87–5.11)
RDW: 13.7 % (ref 11.5–15.5)
WBC: 5.5 10*3/uL (ref 4.0–10.5)

## 2013-10-17 LAB — TYPE AND SCREEN
ABO/RH(D): O POS
ANTIBODY SCREEN: NEGATIVE

## 2013-10-17 LAB — RPR

## 2013-10-17 LAB — ABO/RH: ABO/RH(D): O POS

## 2013-10-17 MED ORDER — CITRIC ACID-SODIUM CITRATE 334-500 MG/5ML PO SOLN
30.0000 mL | ORAL | Status: DC | PRN
  Administered 2013-10-17: 30 mL via ORAL
  Filled 2013-10-17: qty 15

## 2013-10-17 MED ORDER — FENTANYL 2.5 MCG/ML BUPIVACAINE 1/10 % EPIDURAL INFUSION (WH - ANES)
14.0000 mL/h | INTRAMUSCULAR | Status: DC | PRN
  Administered 2013-10-17: 14 mL/h via EPIDURAL
  Filled 2013-10-17: qty 125

## 2013-10-17 MED ORDER — EPHEDRINE 5 MG/ML INJ
10.0000 mg | INTRAVENOUS | Status: DC | PRN
  Filled 2013-10-17: qty 2

## 2013-10-17 MED ORDER — FLEET ENEMA 7-19 GM/118ML RE ENEM: 1.0000 | ENEMA | Freq: Every day | RECTAL | Status: DC | PRN

## 2013-10-17 MED ORDER — MEDROXYPROGESTERONE ACETATE 150 MG/ML IM SUSP
150.0000 mg | INTRAMUSCULAR | Status: DC | PRN
Start: 2013-10-17 — End: 2013-10-19

## 2013-10-17 MED ORDER — LACTATED RINGERS IV SOLN: 500.0000 mL | Freq: Once | INTRAVENOUS | Status: DC

## 2013-10-17 MED ORDER — DIPHENHYDRAMINE HCL 50 MG/ML IJ SOLN: 12.5000 mg | INTRAMUSCULAR | Status: DC | PRN

## 2013-10-17 MED ORDER — WITCH HAZEL-GLYCERIN EX PADS
1.0000 "application " | MEDICATED_PAD | CUTANEOUS | Status: DC | PRN
  Administered 2013-10-19: 1 via TOPICAL

## 2013-10-17 MED ORDER — PENICILLIN G POTASSIUM 5000000 UNITS IJ SOLR
5.0000 10*6.[IU] | Freq: Once | INTRAVENOUS | Status: AC
  Administered 2013-10-17: 5 10*6.[IU] via INTRAVENOUS
  Filled 2013-10-17: qty 5

## 2013-10-17 MED ORDER — LIDOCAINE HCL (PF) 1 % IJ SOLN
INTRAMUSCULAR | Status: AC
  Filled 2013-10-17: qty 30

## 2013-10-17 MED ORDER — SENNOSIDES-DOCUSATE SODIUM 8.6-50 MG PO TABS
2.0000 | ORAL_TABLET | ORAL | Status: DC
  Administered 2013-10-19: 2 via ORAL
  Filled 2013-10-17: qty 2

## 2013-10-17 MED ORDER — PHENYLEPHRINE 40 MCG/ML (10ML) SYRINGE FOR IV PUSH (FOR BLOOD PRESSURE SUPPORT)
80.0000 ug | PREFILLED_SYRINGE | INTRAVENOUS | Status: DC | PRN
  Filled 2013-10-17: qty 2

## 2013-10-17 MED ORDER — ACETAMINOPHEN 325 MG PO TABS: 650.0000 mg | ORAL_TABLET | ORAL | Status: DC | PRN

## 2013-10-17 MED ORDER — PHENYLEPHRINE 40 MCG/ML (10ML) SYRINGE FOR IV PUSH (FOR BLOOD PRESSURE SUPPORT)
80.0000 ug | PREFILLED_SYRINGE | INTRAVENOUS | Status: DC | PRN
  Filled 2013-10-17: qty 10
  Filled 2013-10-17: qty 2

## 2013-10-17 MED ORDER — LACTATED RINGERS IV SOLN: 500.0000 mL | INTRAVENOUS | Status: DC | PRN

## 2013-10-17 MED ORDER — LEVOTHYROXINE SODIUM 88 MCG PO TABS
88.0000 ug | ORAL_TABLET | Freq: Every day | ORAL | Status: DC
  Administered 2013-10-18 – 2013-10-19 (×2): 88 ug via ORAL
  Filled 2013-10-17 (×2): qty 1

## 2013-10-17 MED ORDER — OXYCODONE-ACETAMINOPHEN 5-325 MG PO TABS: 2.0000 | ORAL_TABLET | ORAL | Status: DC | PRN

## 2013-10-17 MED ORDER — LANOLIN HYDROUS EX OINT: TOPICAL_OINTMENT | CUTANEOUS | Status: DC | PRN

## 2013-10-17 MED ORDER — ONDANSETRON HCL 4 MG/2ML IJ SOLN: 4.0000 mg | INTRAMUSCULAR | Status: DC | PRN

## 2013-10-17 MED ORDER — DIBUCAINE 1 % RE OINT
1.0000 "application " | TOPICAL_OINTMENT | RECTAL | Status: DC | PRN
  Administered 2013-10-19: 1 via RECTAL
  Filled 2013-10-17: qty 28

## 2013-10-17 MED ORDER — OXYTOCIN 40 UNITS IN LACTATED RINGERS INFUSION - SIMPLE MED
62.5000 mL/h | INTRAVENOUS | Status: DC
  Administered 2013-10-17: 62.5 mL/h via INTRAVENOUS
  Filled 2013-10-17: qty 1000

## 2013-10-17 MED ORDER — PENICILLIN G POTASSIUM 5000000 UNITS IJ SOLR
2.5000 10*6.[IU] | INTRAVENOUS | Status: DC
  Administered 2013-10-17: 2.5 10*6.[IU] via INTRAVENOUS
  Filled 2013-10-17 (×3): qty 2.5

## 2013-10-17 MED ORDER — PRENATAL MULTIVITAMIN CH
1.0000 | ORAL_TABLET | Freq: Every day | ORAL | Status: DC
  Administered 2013-10-18: 1 via ORAL
  Filled 2013-10-17: qty 1

## 2013-10-17 MED ORDER — SIMETHICONE 80 MG PO CHEW: 80.0000 mg | CHEWABLE_TABLET | ORAL | Status: DC | PRN

## 2013-10-17 MED ORDER — LIDOCAINE HCL (PF) 1 % IJ SOLN
30.0000 mL | INTRAMUSCULAR | Status: AC | PRN
  Administered 2013-10-17 (×2): 5 mL via SUBCUTANEOUS

## 2013-10-17 MED ORDER — BENZOCAINE-MENTHOL 20-0.5 % EX AERO: 1.0000 "application " | INHALATION_SPRAY | CUTANEOUS | Status: DC | PRN

## 2013-10-17 MED ORDER — OXYCODONE-ACETAMINOPHEN 5-325 MG PO TABS
1.0000 | ORAL_TABLET | ORAL | Status: DC | PRN
  Administered 2013-10-18 (×3): 1 via ORAL
  Filled 2013-10-17 (×2): qty 1

## 2013-10-17 MED ORDER — BISACODYL 10 MG RE SUPP: 10.0000 mg | Freq: Every day | RECTAL | Status: DC | PRN

## 2013-10-17 MED ORDER — OXYTOCIN BOLUS FROM INFUSION: 500.0000 mL | INTRAVENOUS | Status: DC

## 2013-10-17 MED ORDER — IBUPROFEN 600 MG PO TABS
600.0000 mg | ORAL_TABLET | Freq: Four times a day (QID) | ORAL | Status: DC
  Administered 2013-10-17 – 2013-10-19 (×6): 600 mg via ORAL
  Filled 2013-10-17 (×5): qty 1

## 2013-10-17 MED ORDER — LACTATED RINGERS IV SOLN: INTRAVENOUS | Status: DC

## 2013-10-17 MED ORDER — ZOLPIDEM TARTRATE 5 MG PO TABS: 5.0000 mg | ORAL_TABLET | Freq: Every evening | ORAL | Status: DC | PRN

## 2013-10-17 MED ORDER — OXYCODONE-ACETAMINOPHEN 5-325 MG PO TABS
2.0000 | ORAL_TABLET | ORAL | Status: DC | PRN
  Administered 2013-10-19: 2 via ORAL
  Filled 2013-10-17 (×2): qty 2

## 2013-10-17 MED ORDER — DIPHENHYDRAMINE HCL 25 MG PO CAPS: 25.0000 mg | ORAL_CAPSULE | Freq: Four times a day (QID) | ORAL | Status: DC | PRN

## 2013-10-17 MED ORDER — MEASLES, MUMPS & RUBELLA VAC ~~LOC~~ INJ
0.5000 mL | INJECTION | Freq: Once | SUBCUTANEOUS | Status: DC
  Filled 2013-10-17: qty 0.5

## 2013-10-17 MED ORDER — ONDANSETRON HCL 4 MG/2ML IJ SOLN: 4.0000 mg | Freq: Four times a day (QID) | INTRAMUSCULAR | Status: DC | PRN

## 2013-10-17 MED ORDER — OXYCODONE-ACETAMINOPHEN 5-325 MG PO TABS: 1.0000 | ORAL_TABLET | ORAL | Status: DC | PRN

## 2013-10-17 MED ORDER — ONDANSETRON HCL 4 MG PO TABS: 4.0000 mg | ORAL_TABLET | ORAL | Status: DC | PRN

## 2013-10-17 MED ORDER — TETANUS-DIPHTH-ACELL PERTUSSIS 5-2.5-18.5 LF-MCG/0.5 IM SUSP: 0.5000 mL | Freq: Once | INTRAMUSCULAR | Status: DC

## 2013-10-17 NOTE — Progress Notes (Signed)
Patient  Is post epidural  Still complaining of right sided pain FHR is Category 1 Cervix is 90%/ 5 to 6 -2 AROM Slightly bloody fluid Follow labor curve

## 2013-10-17 NOTE — Progress Notes (Signed)
Delivery of live viable female by Dr Vincente PoliGrewal per vacuum assisted delivery. Barbaraann FasterM. McGrail at bedside

## 2013-10-17 NOTE — H&P (Signed)
Marisa MassonJennifer Contreras is a 38 y.o.  G 1 P 0 at 41 w 1 days presents for contractions. 4 to 5 cm dilated in triage. Maternal Medical History:  Reason for admission: Contractions.   Contractions: Onset was 3-5 hours ago.   Frequency: regular.   Perceived severity is moderate.    Fetal activity: Perceived fetal activity is normal.      OB History   Grav Para Term Preterm Abortions TAB SAB Ect Mult Living   1              Past Medical History  Diagnosis Date  . Chicken pox 09/08/2010  . Second degree burn of arm 09/12/2010  . Hypertension   . Thyroid disease   . Neck pain, acute 12/11/2010  . Visit for gynecologic examination 12/11/2010  . Headache(784.0) 12/11/2010  . Elevated liver function tests 04/16/2011  . Preventative health care 12/11/2010    Had Tetanus in 2007 per patient   . Shoulder pain, right 12/11/2010  . Sinusitis 01/07/2012  . Neck pain 01/20/2012  . Vaginal Pap smear, abnormal   . Hypothyroidism    Past Surgical History  Procedure Laterality Date  . Trunk skin lesion excisional biopsy      abd, benign  . Gynecologic cryosurgery     Family History: family history includes Alcohol abuse in her paternal grandmother; Diabetes in her father; Heart attack in her paternal grandfather; Heart disease in her maternal grandfather and paternal grandfather; Hyperlipidemia in her maternal grandmother; Hypertension in her father; Thyroid disease in her maternal grandmother. Social History:  reports that she quit smoking about 7 years ago. Her smoking use included Cigarettes. She smoked 0.00 packs per day. She has never used smokeless tobacco. She reports that she does not drink alcohol or use illicit drugs.   Prenatal Transfer Tool  Maternal Diabetes: No Genetic Screening: Normal Maternal Ultrasounds/Referrals: Normal Fetal Ultrasounds or other Referrals:  None Maternal Substance Abuse:  No Significant Maternal Medications:  None Significant Maternal Lab Results:  None Other  Comments:  None  Review of Systems  All other systems reviewed and are negative.   Dilation: 4.5 Effacement (%): 90 Exam by:: GMorris, RN Height 5\' 7"  (1.702 m), weight 67.586 kg (149 lb), last menstrual period 10/03/2012. Maternal Exam:  Uterine Assessment: Contraction strength is moderate.  Contraction frequency is regular.   Abdomen: Fetal presentation: vertex     Fetal Exam Fetal State Assessment: Category I - tracings are normal.     Physical Exam  Nursing note and vitals reviewed. Constitutional: She appears well-developed and well-nourished.  HENT:  Head: Normocephalic.  Eyes: Pupils are equal, round, and reactive to light.  Neck: Normal range of motion.  Cardiovascular: Normal rate and regular rhythm.   Respiratory: Effort normal.    Prenatal labs: ABO, Rh: O/Positive/-- (03/05 0000) Antibody: Negative (03/05 0000) Rubella: Immune (03/05 0000) RPR: Nonreactive (03/05 0000)  HBsAg: Negative (03/05 0000)  HIV: Non-reactive (03/05 0000)  GBS: POSITIVE, Positive (09/09 0000)   Assessment/Plan: IUP at 41 w 1 day Labor GBBS + Receiving epidural now AROM after epidural Antibiotics for GBBS +   Marquetta Weiskopf L 10/17/2013, 1:54 PM

## 2013-10-17 NOTE — Anesthesia Procedure Notes (Signed)
Epidural Patient location during procedure: OB Start time: 10/17/2013 2:05 PM End time: 10/17/2013 2:17 PM  Staffing Anesthesiologist: Mikel Pyon, CHRIS Performed by: anesthesiologist   Preanesthetic Checklist Completed: patient identified, surgical consent, pre-op evaluation, timeout performed, IV checked, risks and benefits discussed and monitors and equipment checked  Epidural Patient position: sitting Prep: site prepped and draped and DuraPrep Patient monitoring: heart rate, cardiac monitor, continuous pulse ox and blood pressure Approach: midline Location: L3-L4 Injection technique: LOR saline  Needle:  Needle type: Tuohy  Needle gauge: 17 G Needle length: 9 cm Needle insertion depth: 6 cm Catheter type: closed end flexible Catheter size: 19 Gauge Catheter at skin depth: 12 cm Test dose: Other and negative  Assessment Events: blood not aspirated, injection not painful, no injection resistance, negative IV test and no paresthesia  Additional Notes H+P and labs checked, risks and benefits discussed with the patient, consent obtained, procedure tolerated well and without complications.  Reason for block:procedure for pain

## 2013-10-17 NOTE — Anesthesia Preprocedure Evaluation (Signed)
Anesthesia Evaluation  Patient identified by MRN, date of birth, ID band Patient awake    Reviewed: Allergy & Precautions, H&P , NPO status , Patient's Chart, lab work & pertinent test results  History of Anesthesia Complications Negative for: history of anesthetic complications  Airway Mallampati: I TM Distance: >3 FB Neck ROM: Full    Dental  (+) Teeth Intact   Pulmonary neg shortness of breath, neg COPDneg recent URI, former smoker,          Cardiovascular negative cardio ROS  Rhythm:Regular     Neuro/Psych  Headaches, negative psych ROS   GI/Hepatic negative GI ROS, Neg liver ROS,   Endo/Other  Hypothyroidism   Renal/GU negative Renal ROS     Musculoskeletal   Abdominal   Peds  Hematology  (+) anemia ,   Anesthesia Other Findings   Reproductive/Obstetrics (+) Pregnancy                           Anesthesia Physical Anesthesia Plan  ASA: II  Anesthesia Plan: Epidural   Post-op Pain Management:    Induction:   Airway Management Planned:   Additional Equipment: None  Intra-op Plan:   Post-operative Plan:   Informed Consent: I have reviewed the patients History and Physical, chart, labs and discussed the procedure including the risks, benefits and alternatives for the proposed anesthesia with the patient or authorized representative who has indicated his/her understanding and acceptance.   Dental advisory given  Plan Discussed with: Anesthesiologist  Anesthesia Plan Comments:         Anesthesia Quick Evaluation

## 2013-10-17 NOTE — MAU Note (Signed)
GMorris, RN spoke with Marsh & McLennanHMitchell, Geophysicist/field seismologistN charge. Pt to go to room 166.

## 2013-10-17 NOTE — MAU Note (Signed)
Pt states was 4cm in office. Began bleeding after coming in to MAU. Denies gush of fluid, ctx's q 3.5 minutes apart.

## 2013-10-17 NOTE — Progress Notes (Signed)
Dr Vincente PoliGrewal notified of pt's arrival, contraction pattern, VE , orders received to admit pt

## 2013-10-17 NOTE — Progress Notes (Signed)
Assisted patient to bathroom via steady. Patient became lightheaded and dizzy, passing out on toilet. Assistance was called and patient was helped back to bed. Vital signs and bleeding stable. Patient alert and responsive after about 1 minute. [facility]

## 2013-10-18 LAB — CBC
HCT: 21.8 % — ABNORMAL LOW (ref 36.0–46.0)
Hemoglobin: 7.7 g/dL — ABNORMAL LOW (ref 12.0–15.0)
MCH: 30 pg (ref 26.0–34.0)
MCHC: 34.9 g/dL (ref 30.0–36.0)
MCV: 86.2 fL (ref 78.0–100.0)
Platelets: 124 10*3/uL — ABNORMAL LOW (ref 150–400)
RBC: 2.53 MIL/uL — ABNORMAL LOW (ref 3.87–5.11)
RDW: 13.6 % (ref 11.5–15.5)
WBC: 7.8 10*3/uL (ref 4.0–10.5)

## 2013-10-18 NOTE — Progress Notes (Signed)
Post Partum Day 1 Subjective: no complaints  Objective: Blood pressure 124/79, pulse 71, temperature 97.6 F (36.4 C), temperature source Oral, resp. rate 18, height 5\' 7"  (1.702 m), weight 67.586 kg (149 lb), last menstrual period 10/03/2012, SpO2 100.00%, unknown if currently breastfeeding.  Physical Exam:  General: alert, cooperative and appears stated age 86Lochia: appropriate Uterine Fundus: firm Incision: healing well, no significant drainage, no dehiscence DVT Evaluation: No evidence of DVT seen on physical exam.   Recent Labs  10/17/13 1340 10/18/13 0600  HGB 11.1* 7.7*  HCT 31.4* 21.8*    Assessment/Plan: Plan for discharge tomorrow and Circumcision prior to discharge   LOS: 1 day   Lennox Leikam L 10/18/2013, 7:36 AM

## 2013-10-18 NOTE — Anesthesia Postprocedure Evaluation (Signed)
  Anesthesia Post-op Note  Patient: Marisa MassonJennifer Ladd  Procedure(s) Performed: * No procedures listed *  Patient Location: Mother/Baby  Anesthesia Type:Epidural  Level of Consciousness: awake and alert   Airway and Oxygen Therapy: Patient Spontanous Breathing  Post-op Pain: mild  Post-op Assessment: Post-op Vital signs reviewed, Patient's Cardiovascular Status Stable, Respiratory Function Stable, No signs of Nausea or vomiting, Adequate PO intake, Pain level controlled, No headache, No residual numbness and No residual motor weakness  Post-op Vital Signs: Reviewed  Last Vitals:  Filed Vitals:   10/18/13 0200  BP: 124/79  Pulse: 71  Temp: 36.4 C  Resp: 18    Complications: No apparent anesthesia complications

## 2013-10-19 ENCOUNTER — Inpatient Hospital Stay (HOSPITAL_COMMUNITY): Admission: RE | Admit: 2013-10-19 | Payer: Managed Care, Other (non HMO) | Source: Ambulatory Visit

## 2013-10-19 LAB — CBC
HCT: 19.6 % — ABNORMAL LOW (ref 36.0–46.0)
Hemoglobin: 7 g/dL — ABNORMAL LOW (ref 12.0–15.0)
MCH: 30.8 pg (ref 26.0–34.0)
MCHC: 35.7 g/dL (ref 30.0–36.0)
MCV: 86.3 fL (ref 78.0–100.0)
Platelets: 149 10*3/uL — ABNORMAL LOW (ref 150–400)
RBC: 2.27 MIL/uL — ABNORMAL LOW (ref 3.87–5.11)
RDW: 14 % (ref 11.5–15.5)
WBC: 8.4 10*3/uL (ref 4.0–10.5)

## 2013-10-19 MED ORDER — OXYCODONE-ACETAMINOPHEN 5-325 MG PO TABS: 1.0000 | ORAL_TABLET | ORAL | Status: DC | PRN

## 2013-10-19 MED ORDER — IBUPROFEN 600 MG PO TABS: 600.0000 mg | ORAL_TABLET | Freq: Four times a day (QID) | ORAL | Status: DC

## 2013-10-19 NOTE — Discharge Summary (Signed)
Obstetric Discharge Summary Reason for Admission: onset of labor Prenatal Procedures: ultrasound Intrapartum Procedures: spontaneous vaginal delivery Postpartum Procedures: none Complications-Operative and Postpartum: 1 degree perineal laceration Hemoglobin  Date Value Ref Range Status  10/19/2013 7.0* 12.0 - 15.0 g/dL Final     HCT  Date Value Ref Range Status  10/19/2013 19.6* 36.0 - 46.0 % Final    Physical Exam:  General: alert and cooperative Lochia: appropriate Uterine Fundus: firm Incision: perineum intact, small hemorrhoid DVT Evaluation: No evidence of DVT seen on physical exam. Negative Homan's sign. No cords or calf tenderness. No significant calf/ankle edema.  Discharge Diagnoses: Term Pregnancy-delivered  Discharge Information: Date: 10/19/2013 Activity: pelvic rest Diet: routine Medications: PNV, Ibuprofen and Percocet Condition: stable Instructions: refer to practice specific booklet Discharge to: home   Newborn Data: Live born female  Birth Weight: 7 lb 10.4 oz (3470 g) APGAR: 9, 10  Home with mother.  Tonji Elliff G 10/19/2013, 8:47 AM

## 2013-10-20 ENCOUNTER — Inpatient Hospital Stay (HOSPITAL_COMMUNITY): Admission: RE | Admit: 2013-10-20 | Payer: Managed Care, Other (non HMO) | Source: Ambulatory Visit

## 2013-11-02 ENCOUNTER — Encounter (HOSPITAL_COMMUNITY): Payer: Self-pay

## 2013-11-18 ENCOUNTER — Other Ambulatory Visit: Payer: Self-pay | Admitting: Obstetrics & Gynecology

## 2013-11-19 LAB — CYTOLOGY - PAP

## 2014-04-26 ENCOUNTER — Telehealth: Payer: Self-pay | Admitting: Family Medicine

## 2014-04-26 NOTE — Telephone Encounter (Signed)
Patient Name: Marisa MassonJENNIFER Contreras  DOB: Feb 04, 1975    Initial Comment caller has neck and shoulder pain,    Nurse Assessment  Nurse: Shelva MajesticWest, RN, Marchelle FolksAmanda Date/Time Lamount Cohen(Eastern Time): 04/26/2014 5:40:14 PM  Confirm and document reason for call. If symptomatic, describe symptoms. ---caller has neck and shoulder pain that started this morning; no known injury  Has the patient traveled out of the country within the last 30 days? ---Not Applicable  Does the patient require triage? ---Yes  Related visit to physician within the last 2 weeks? ---No  Does the PT have any chronic conditions? (i.e. diabetes, asthma, etc.) ---No  Did the patient indicate they were pregnant? ---No     Guidelines    Guideline Title Affirmed Question Affirmed Notes  Neck Pain or Stiffness Pain shoots (radiates) into arm or hand    Final Disposition User   See PCP When Office is Open (within 3 days) ChadWest, Charity fundraiserN, Allstatemanda

## 2014-04-26 NOTE — Telephone Encounter (Signed)
Patient called in stating that she has had intense back pain and neck pain today. Call transferred to team health

## 2014-04-27 ENCOUNTER — Ambulatory Visit (INDEPENDENT_AMBULATORY_CARE_PROVIDER_SITE_OTHER): Payer: Managed Care, Other (non HMO) | Admitting: Physician Assistant

## 2014-04-27 ENCOUNTER — Encounter: Payer: Self-pay | Admitting: Physician Assistant

## 2014-04-27 VITALS — BP 128/74 | HR 59 | Temp 98.0°F | Resp 16 | Ht 67.0 in | Wt 129.0 lb

## 2014-04-27 DIAGNOSIS — M62838 Other muscle spasm: Secondary | ICD-10-CM

## 2014-04-27 DIAGNOSIS — M6248 Contracture of muscle, other site: Secondary | ICD-10-CM | POA: Diagnosis not present

## 2014-04-27 MED ORDER — CYCLOBENZAPRINE HCL 10 MG PO TABS: 10.0000 mg | ORAL_TABLET | Freq: Three times a day (TID) | ORAL | Status: DC | PRN

## 2014-04-27 NOTE — Progress Notes (Signed)
Patient presents to clinic today c/o one day of neck pain and tightness after some heavy lifting. Patient denies trauma or injury to neck. Denies radiation of pain into her upper extremities. Denies numbness, tingling or weakness of extremities endorses good range of motion, albeit with pain. Has not taken anything for symptoms.  Past Medical History  Diagnosis Date  . Chicken pox 09/08/2010  . Second degree burn of arm 09/12/2010  . Hypertension   . Thyroid disease   . Neck pain, acute 12/11/2010  . Visit for gynecologic examination 12/11/2010  . Headache(784.0) 12/11/2010  . Elevated liver function tests 04/16/2011  . Preventative health care 12/11/2010    Had Tetanus in 2007 per patient   . Shoulder pain, right 12/11/2010  . Sinusitis 01/07/2012  . Neck pain 01/20/2012  . Vaginal Pap smear, abnormal   . Hypothyroidism     Current Outpatient Prescriptions on File Prior to Visit  Medication Sig Dispense Refill  . levothyroxine (SYNTHROID, LEVOTHROID) 88 MCG tablet Take 1 tablet (88 mcg total) by mouth daily before breakfast. 90 tablet 3  . ibuprofen (ADVIL,MOTRIN) 600 MG tablet Take 1 tablet (600 mg total) by mouth every 6 (six) hours. (Patient not taking: Reported on 04/27/2014) 30 tablet 1   No current facility-administered medications on file prior to visit.    No Known Allergies  Family History  Problem Relation Age of Onset  . Hypertension Father   . Diabetes Father   . Thyroid disease Maternal Grandmother   . Hyperlipidemia Maternal Grandmother   . Heart disease Maternal Grandfather   . Alcohol abuse Paternal Grandmother   . Heart attack Paternal Grandfather   . Heart disease Paternal Grandfather     History   Social History  . Marital Status: Married    Spouse Name: N/A  . Number of Children: N/A  . Years of Education: N/A   Social History Main Topics  . Smoking status: Former Smoker    Types: Cigarettes    Quit date: 01/01/2006  . Smokeless tobacco: Never  Used  . Alcohol Use: No     Comment: 2 bottle of wine a week  . Drug Use: No  . Sexual Activity:    Partners: Male   Other Topics Concern  . None   Social History Narrative    Review of Systems - See HPI.  All other ROS are negative.  BP 128/74 mmHg  Pulse 59  Temp(Src) 98 F (36.7 C) (Oral)  Resp 16  Ht  (1.702 m)  Wt 129 lb (58.514 kg)  BMI 20.20 kg/m2  SpO2 97%  LMP 04/13/2014  Breastfeeding? No  Physical Exam  Constitutional: She is oriented to person, place, and time and well-developed, well-nourished, and in no distress.  HENT:  Head: Normocephalic and atraumatic.  Neck: Neck supple. Muscular tenderness present. No spinous process tenderness present. No rigidity. Normal range of motion present.  Cardiovascular: Normal rate, regular rhythm, normal heart sounds and intact distal pulses.   Pulmonary/Chest: Effort normal and breath sounds normal. No respiratory distress. She has no wheezes. She has no rales. She exhibits no tenderness.  Neurological: She is oriented to person, place, and time.  Skin: Skin is warm and dry. No rash noted.  Psychiatric: Affect normal.  Vitals reviewed.  Assessment/Plan: Muscle spasms of neck Supportive measures discussed with patient. Rx Flexeril TID. Tylenol ES as directed if needed for pain.  Avoid heavy lifting or overexertion.  Alternate moist heat and ice packs.  Call  or return to clinic if symptoms are not improving.

## 2014-04-27 NOTE — Telephone Encounter (Signed)
Fwd to correct office

## 2014-04-27 NOTE — Patient Instructions (Signed)
Please apply moist heat to the area in 10-15 minute intervals, a few times per day. Apply topical Aspercreme or icy hot to the area. Limit heavy lifting. Take the muscle relaxant (Flexeril) as directed. Do not use this medication while driving. Symptoms should improve over the next few days. Call or return to clinic if symptoms are not improving.

## 2014-04-27 NOTE — Assessment & Plan Note (Signed)
Supportive measures discussed with patient. Rx Flexeril TID. Tylenol ES as directed if needed for pain.  Avoid heavy lifting or overexertion.  Alternate moist heat and ice packs.  Call or return to clinic if symptoms are not improving.

## 2014-11-02 LAB — HM PAP SMEAR: HM Pap smear: NORMAL

## 2014-11-24 LAB — LIPID PANEL
Cholesterol: 203 mg/dL — AB (ref 0–200)
HDL: 84 mg/dL — AB (ref 35–70)
LDL Cholesterol: 103 mg/dL
Triglycerides: 279 mg/dL — AB (ref 40–160)

## 2014-12-22 ENCOUNTER — Telehealth: Payer: Self-pay | Admitting: Physician Assistant

## 2014-12-23 ENCOUNTER — Telehealth: Payer: Self-pay | Admitting: Family Medicine

## 2014-12-23 NOTE — Telephone Encounter (Signed)
Pt called to see if she could have appt before the end of the year for a complete lab work up. She states not a physical, but all that she listed is inclusive in a physical. In Dr. Senaida LangeB's absence can pt see either Ramon DredgeEdward or Damascusody?

## 2014-12-23 NOTE — Telephone Encounter (Signed)
Im off next week so doubt I can fit her in tomorrow -- all physical appointments are full

## 2014-12-23 NOTE — Telephone Encounter (Signed)
Pt was no show 12/22/14 7:00am for acute appt, 3rd no show with you since Oct. Pt has not rescheduled, charge or no charge?

## 2014-12-23 NOTE — Telephone Encounter (Signed)
Sorry...wrong pt acct.

## 2014-12-23 NOTE — Telephone Encounter (Signed)
Charge and send a note to Dr. B regarding number of no-shows.

## 2014-12-24 NOTE — Telephone Encounter (Signed)
Scheduled as CPE for 12/31/14 9:30am with Ramon DredgeEdward

## 2014-12-24 NOTE — Telephone Encounter (Signed)
I don't understand her request. She wants physical exam labs but does not want a physical. Most of the time labs ordered under physical exam diagnosis code or we are ordering based on diagnosis. This is so labs are covered under insurance. In epic it state no coverage for her visit. So I am thinking she does not have insurance(but wanted to get this done before end of year sounds like insurance requirement?). Just want to understand what is going on. I could see her next Wednesday, Friday(thursday usually very busy and increases chance she may have to wait). Recommend early am appointment. Want 30 minute slot. What labs does she want. If she wants lipid panel come in fasting.

## 2014-12-30 ENCOUNTER — Encounter: Payer: Self-pay | Admitting: Behavioral Health

## 2014-12-30 ENCOUNTER — Telehealth: Payer: Self-pay | Admitting: Behavioral Health

## 2014-12-30 NOTE — Addendum Note (Signed)
Addended by: Melanee SpryBYRD, RONECIA E on: 12/30/2014 04:23 PM   Modules accepted: Orders, Medications

## 2014-12-30 NOTE — Telephone Encounter (Signed)
Pre-Visit Call completed with patient and chart updated.   Pre-Visit Info documented in Specialty Comments under [employer]ot.    

## 2014-12-30 NOTE — Telephone Encounter (Signed)
Unable to reach patient at time of Pre-Visit Call.  Left message for patient to return call when available.    

## 2014-12-31 ENCOUNTER — Ambulatory Visit (INDEPENDENT_AMBULATORY_CARE_PROVIDER_SITE_OTHER): Payer: Managed Care, Other (non HMO) | Admitting: Medical

## 2014-12-31 ENCOUNTER — Encounter: Payer: Self-pay | Admitting: Medical

## 2014-12-31 VITALS — BP 118/76 | HR 60 | Temp 98.4°F | Ht 67.0 in | Wt 124.2 lb

## 2014-12-31 DIAGNOSIS — R5383 Other fatigue: Secondary | ICD-10-CM | POA: Diagnosis not present

## 2014-12-31 DIAGNOSIS — R030 Elevated blood-pressure reading, without diagnosis of hypertension: Secondary | ICD-10-CM

## 2014-12-31 DIAGNOSIS — J209 Acute bronchitis, unspecified: Secondary | ICD-10-CM

## 2014-12-31 DIAGNOSIS — IMO0001 Reserved for inherently not codable concepts without codable children: Secondary | ICD-10-CM

## 2014-12-31 LAB — COMPREHENSIVE METABOLIC PANEL
ALT: 46 U/L — AB (ref 0–35)
AST: 51 U/L — AB (ref 0–37)
Albumin: 4.3 g/dL (ref 3.5–5.2)
Alkaline Phosphatase: 97 U/L (ref 39–117)
BILIRUBIN TOTAL: 0.3 mg/dL (ref 0.2–1.2)
BUN: 13 mg/dL (ref 6–23)
CALCIUM: 9.5 mg/dL (ref 8.4–10.5)
CO2: 30 mEq/L (ref 19–32)
CREATININE: 0.84 mg/dL (ref 0.40–1.20)
Chloride: 98 mEq/L (ref 96–112)
GFR: 80.61 mL/min (ref >60.00)
Glucose, Bld: 83 mg/dL (ref 70–99)
Potassium: 4 mEq/L (ref 3.5–5.1)
Sodium: 135 mEq/L (ref 135–145)
TOTAL PROTEIN: 8 g/dL (ref 6.0–8.3)

## 2014-12-31 LAB — CBC WITH DIFFERENTIAL/PLATELET
BASOS ABS: 0 10*3/uL (ref 0.0–0.1)
Basophils Relative: 0.6 % (ref 0.0–3.0)
EOS ABS: 0 10*3/uL (ref 0.0–0.7)
Eosinophils Relative: 0.6 % (ref 0.0–5.0)
HEMATOCRIT: 39.7 % (ref 36.0–46.0)
Hemoglobin: 13.3 g/dL (ref 12.0–15.0)
LYMPHS PCT: 30.9 % (ref 12.0–46.0)
Lymphs Abs: 2.2 10*3/uL (ref 0.7–4.0)
MCHC: 33.5 g/dL (ref 30.0–36.0)
MCV: 90.1 fl (ref 78.0–100.0)
MONOS PCT: 6.1 % (ref 3.0–12.0)
Monocytes Absolute: 0.4 10*3/uL (ref 0.1–1.0)
Neutro Abs: 4.4 10*3/uL (ref 1.4–7.7)
Neutrophils Relative %: 61.8 % (ref 43.0–77.0)
Platelets: 333 10*3/uL (ref 150.0–400.0)
RBC: 4.4 Mil/uL (ref 3.87–5.11)
RDW: 12.9 % (ref 11.5–15.5)
WBC: 7.2 10*3/uL (ref 4.0–10.5)

## 2014-12-31 MED ORDER — AZITHROMYCIN 250 MG PO TABS: ORAL_TABLET | ORAL | Status: DC

## 2014-12-31 MED ORDER — BENZONATATE 100 MG PO CAPS: 100.0000 mg | ORAL_CAPSULE | Freq: Three times a day (TID) | ORAL | Status: DC | PRN

## 2014-12-31 MED ORDER — FLUTICASONE PROPIONATE 50 MCG/ACT NA SUSP: 2.0000 | Freq: Every day | NASAL | Status: DC

## 2014-12-31 NOTE — Progress Notes (Signed)
Pre visit review using our clinic review tool, if applicable. No additional management support is needed unless otherwise documented below in the visit note. 

## 2014-12-31 NOTE — Progress Notes (Signed)
Subjective:    Patient ID: Marisa Contreras, female    DOB: 1975/10/24, 39 y.o.   MRN: [id number]  HPI  Pt in for wellness exam/physical.(pt did have exam with gyn and  z code was used as well).  Pt when made this appointment did not want physical initially. At gyn lipid panel and tsh was done.  Pt blood pressure is controlled. She does have history of htn.  Some fatigue related to illness reported below  but pt is not sure. 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   Pt declined flu vaccine. Pt up to date on tdap.  She got pap smear on November 26, 2014. Normal.  Pt does exercise about 4 days a week in gym and then walks dog every day. Pt states most part healthy diet. 2 cups coffee in am.  Pt also mentioned that she has been nasal congested for 2 weeks. Pt states for past 5 days some chest congestion and cough up mucous in am. No fever or chills but some recent more sweating.  Pt states gyn checked her thyroid and her cholesterol at gyn.   Review of Systems  Constitutional: Positive for diaphoresis. Negative for fever, chills and fatigue.  HENT: Positive for congestion. Negative for ear pain, facial swelling, postnasal drip, sinus pressure and sneezing.   Respiratory: Positive for cough. Negative for chest tightness, shortness of breath and wheezing.   Cardiovascular: Negative for chest pain and palpitations.  Gastrointestinal: Negative for nausea and abdominal pain.  Genitourinary: Negative for dysuria, flank pain and dyspareunia.  Musculoskeletal: Negative for back pain.  Skin: Negative for rash.  Neurological: Negative for dizziness, syncope, weakness, numbness and headaches.  Hematological: Negative for adenopathy. Does not bruise/bleed easily.  Psychiatric/Behavioral: Negative for behavioral problems and confusion.    Past Medical History  Diagnosis Date  . Chicken pox 09/08/2010  . Second degree burn of arm 09/12/2010  . Hypertension   . Thyroid disease   . Neck  pain, acute 12/11/2010  . Visit for gynecologic examination 12/11/2010  . Headache(784.0) 12/11/2010  . Elevated liver function tests 04/16/2011  . Preventative health care 12/11/2010    Had Tetanus in 2007 per patient   . Shoulder pain, right 12/11/2010  . Sinusitis 01/07/2012  . Neck pain 01/20/2012  . Vaginal Pap smear, abnormal   . Hypothyroidism     Social History   Social History  . Marital Status: Married    Spouse Name: N/A  . Number of Children: N/A  . Years of Education: N/A   Occupational History  . Not on file.   Social History Main Topics  . Smoking status: Former Smoker    Types: Cigarettes    Quit date: 01/01/2006  . Smokeless tobacco: Never Used  . Alcohol Use: No     Comment: 2 bottle of wine a week  . Drug Use: No  . Sexual Activity:    Partners: Male   Other Topics Concern  . Not on file   Social History Narrative    Past Surgical History  Procedure Laterality Date  . Trunk skin lesion excisional biopsy      abd, benign  . Gynecologic cryosurgery      Family History  Problem Relation Age of Onset  . Hypertension Father   . Diabetes Father   . Thyroid disease Maternal Grandmother   . Hyperlipidemia Maternal Grandmother   . Heart disease Maternal Grandfather   . Alcohol abuse Paternal Grandmother   . Heart  attack Paternal Grandfather   . Heart disease Paternal Grandfather     No Known Allergies  Current Outpatient Prescriptions on File Prior to Visit  Medication Sig Dispense Refill  . levothyroxine (SYNTHROID, LEVOTHROID) 88 MCG tablet Take 1 tablet (88 mcg total) by mouth daily before breakfast. 90 tablet 3   No current facility-administered medications on file prior to visit.    BP 118/76 mmHg  Pulse 60  Temp(Src) 98.4 F (36.9 C) (Oral)  Ht 5\' 7"  (1.702 m)  Wt 124 lb 3.2 oz (56.337 kg)  BMI 19.45 kg/m2  SpO2 98%  LMP 12/29/2014       Objective:   Physical Exam  General Mental Status- Alert. General Appearance- Not  in acute distress.   Skin Rashes- No Rashes.  HEENT Head- Normal. Ear Auditory Canal - Left- Normal. Right - Normal.Tympanic Membrane- Left- Normal. Right- Normal. Eye Sclera/Conjunctiva- Left- Normal. Right- Normal. Nose & Sinuses Nasal Mucosa- Left-  Boggy and Congested. Right-  Boggy and  Congested.Bilateral  No maxillary and no frontal sinus pressure. Mouth & Throat Lips: Upper Lip- Normal: no dryness, cracking, pallor, cyanosis, or vesicular eruption. Lower Lip-Normal: no dryness, cracking, pallor, cyanosis or vesicular eruption. Buccal Mucosa- Bilateral- No Aphthous ulcers. Oropharynx- No Discharge or Erythema. +pnd Tonsils: Characteristics- Bilateral- No Erythema or Congestion. Size/Enlargement- Bilateral- No enlargement. Discharge- bilateral-None.    .Neck Carotid Arteries- Normal color. Moisture- Normal Moisture. .  Chest and Lung Exam Auscultation: Breath Sounds:-Normal.  Cardiovascular Auscultation:Rythm- Regular. Murmurs & Other Heart Sounds:Auscultation of the heart reveals- No Murmurs.  Abdomen Inspection:-Inspeection Normal. Palpation/Percussion:Note:No mass. Palpation and Percussion of the abdomen reveal- Non Tender, Non Distended + BS, no rebound or guarding.  Neurologic Cranial Nerve exam:- CN III-XII intact(No nystagmus), symmetric smile. Strength:- 5/5 equal and symmetric strength both upper and lower extremities.  Back- no cva.      Assessment & Plan:  For your hx of htn and controlled now will get cmp.  For your mild fatigue which may be normal variant will get cbc as well. Please get gyn office to send us result of tsh.  For possible early bronchitis after long course uri type symptoms will rx flonase, benzonatate and azithromycin.  Follow up in 7 days or as needed  Called pt gynecologist.  They used a z code and billing mentioned another z code might not be covered. So decided not to do wellness exam.

## 2014-12-31 NOTE — Patient Instructions (Addendum)
For your hx of htn and controlled now will get cmp.  For your mild fatigue which may be normal variant will get cbc as well. Please get gyn office to send us result of tsh.  For possible early bronchitis after long course uri type symptoms will rx flonase, benzonatate and azithromycin.  Follow up in 7 days or as needed.

## 2015-01-04 ENCOUNTER — Telehealth: Payer: Self-pay | Admitting: Family Medicine

## 2015-01-04 DIAGNOSIS — E785 Hyperlipidemia, unspecified: Secondary | ICD-10-CM

## 2015-01-04 NOTE — Telephone Encounter (Signed)
Caller name: Victorino DikeJennifer   Relationship to patient: Self    Can be reached: [phone]    Reason for call: Pt says that she was told by Ramon DredgeEdward to call back and give him her levels from a different provider. Her cholesterol was 203, triglycerides 279, HDO Cholesterol 84 LDL 103  As of 11/24/14.

## 2015-01-04 NOTE — Telephone Encounter (Signed)
Let pt know I got numbers and thanks. Only mild elevated. I would use eat healthy, diet exercise and try krill oil otc. Repeat lipid panel fasting in 6 months. If you would put fasting future lipid and cmp in. She does not need appointment with me or her pcp. She could just get that done through lab. Dx to associate would be hyperlipidemia.

## 2015-01-04 NOTE — Telephone Encounter (Signed)
Please see results below. I will abstract 01/04/15.

## 2015-01-05 NOTE — Telephone Encounter (Signed)
Spoke with pt and she voices understanding. Future lab order entered.

## 2015-04-26 ENCOUNTER — Telehealth: Payer: Self-pay | Admitting: *Deleted

## 2015-04-26 NOTE — Telephone Encounter (Signed)
Forwarded to Dr. Blyth. JG//CMA  

## 2015-04-28 ENCOUNTER — Encounter: Payer: Self-pay | Admitting: Family Medicine

## 2015-04-28 ENCOUNTER — Ambulatory Visit (INDEPENDENT_AMBULATORY_CARE_PROVIDER_SITE_OTHER): Payer: Managed Care, Other (non HMO) | Admitting: Family Medicine

## 2015-04-28 ENCOUNTER — Telehealth: Payer: Self-pay | Admitting: Family Medicine

## 2015-04-28 VITALS — BP 122/78 | HR 63 | Temp 98.2°F | Ht 67.0 in | Wt 128.2 lb

## 2015-04-28 DIAGNOSIS — J209 Acute bronchitis, unspecified: Secondary | ICD-10-CM | POA: Diagnosis not present

## 2015-04-28 DIAGNOSIS — I10 Essential (primary) hypertension: Secondary | ICD-10-CM

## 2015-04-28 DIAGNOSIS — E038 Other specified hypothyroidism: Secondary | ICD-10-CM

## 2015-04-28 DIAGNOSIS — R091 Pleurisy: Secondary | ICD-10-CM | POA: Diagnosis not present

## 2015-04-28 HISTORY — DX: Acute bronchitis, unspecified: J20.9

## 2015-04-28 HISTORY — DX: Pleurisy: R09.1

## 2015-04-28 MED ORDER — MELOXICAM 15 MG PO TABS: 15.0000 mg | ORAL_TABLET | Freq: Every day | ORAL | Status: DC | PRN

## 2015-04-28 MED ORDER — AZITHROMYCIN 250 MG PO TABS: ORAL_TABLET | ORAL | Status: DC

## 2015-04-28 NOTE — Assessment & Plan Note (Signed)
Likely viral will treat with Vitamin C, elderberry and probiotics, will treat with Azithromycin

## 2015-04-28 NOTE — Patient Instructions (Addendum)
NOW Probiotic Vitamin. Available at [facility].  Elderberry NOW.com Salonpas with lidocaine and Aspercreme. Pleurisy Pleurisy is an inflammation and swelling of the lining of the lungs (pleura). Because of this inflammation, it hurts to breathe. It can be aggravated by coughing, laughing, or deep breathing. Pleurisy is often caused by an underlying infection or disease.  HOME CARE INSTRUCTIONS  Monitor your pleurisy for any changes. The following actions may help to alleviate any discomfort you are experiencing:  Medicine may help with pain. Only take over-the-counter or prescription medicines for pain, discomfort, or fever as directed by your health care provider.  Only take antibiotic medicine as directed. Make sure to finish it even if you start to feel better. SEEK MEDICAL CARE IF:   Your pain is not controlled with medicine or is increasing.  You have an increase in pus-like (purulent) secretions brought up with coughing. SEEK IMMEDIATE MEDICAL CARE IF:   You have blue or dark lips, fingernails, or toenails.  You are coughing up blood.  You have increased difficulty breathing.  You have continuing pain unrelieved by medicine or pain lasting more than 1 week.  You have pain that radiates into your neck, arms, or jaw.  You develop increased shortness of breath or wheezing.  You develop a fever, rash, vomiting, fainting, or other serious symptoms. MAKE SURE YOU:  Understand these instructions.   Will watch your condition.   Will get help right away if you are not doing well or get worse.    This information is not intended to replace advice given to you by your health care provider. Make sure you discuss any questions you have with your health care provider.   Document Released: 12/18/2004 Document Revised: 08/20/2012 Document Reviewed: 06/01/2012 Elsevier Interactive Patient Education [employer]2016 Elsevier Inc.

## 2015-04-28 NOTE — Telephone Encounter (Signed)
-----   Message from Audelia Actonenise L Smith sent at 04/28/2015  9:37 AM EDT ----- Regarding: Returned Call Contact: [phone] Returned your call

## 2015-04-28 NOTE — Progress Notes (Signed)
Subjective:    Patient ID: Marisa Contreras, female    DOB: May 23, 1975, 40 y.o.   MRN: [id number]  Chief Complaint  Patient presents with  . Follow-up    HPI Patient is in today for follow up.  Patient has some concern about an irritated lung lining went to urgent care and MD treated with cough syrup, but feels like it did not improve symptoms. Antiinflammatories have been somewhat helpful but pain returns. No SOB, fevers, malaise, palpitations or GI or GU concerns. 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    Past Medical History  Diagnosis Date  . Chicken pox 09/08/2010  . Second degree burn of arm 09/12/2010  . Hypertension   . Thyroid disease   . Neck pain, acute 12/11/2010  . Visit for gynecologic examination 12/11/2010  . Headache(784.0) 12/11/2010  . Elevated liver function tests 04/16/2011  . Preventative health care 12/11/2010    Had Tetanus in 2007 per patient   . Shoulder pain, right 12/11/2010  . Sinusitis 01/07/2012  . Neck pain 01/20/2012  . Vaginal Pap smear, abnormal   . Hypothyroidism   . Pleurisy 04/28/2015  . Acute bronchitis 04/28/2015  . History of chicken pox 09/08/2010    Past Surgical History  Procedure Laterality Date  . Trunk skin lesion excisional biopsy      abd, benign  . Gynecologic cryosurgery      Family History  Problem Relation Age of Onset  . Hypertension Father   . Diabetes Father   . Thyroid disease Maternal Grandmother   . Hyperlipidemia Maternal Grandmother   . Heart disease Maternal Grandfather   . Alcohol abuse Paternal Grandmother   . Heart attack Paternal Grandfather   . Heart disease Paternal Grandfather     Social History   Social History  . Marital Status: Married    Spouse Name: N/A  . Number of Children: N/A  . Years of Education: N/A   Occupational History  . Not on file.   Social History Main Topics  . Smoking status: Former Smoker    Types: Cigarettes    Quit date: 01/01/2006  . Smokeless tobacco:  Never Used  . Alcohol Use: No     Comment: 2 bottle of wine a week  . Drug Use: No  . Sexual Activity:    Partners: Male   Other Topics Concern  . Not on file   Social History Narrative    Outpatient Prescriptions Prior to Visit  Medication Sig Dispense Refill  . levothyroxine (SYNTHROID, LEVOTHROID) 88 MCG tablet Take 1 tablet (88 mcg total) by mouth daily before breakfast. 90 tablet 3  . benzonatate (TESSALON) 100 MG capsule Take 1 capsule (100 mg total) by mouth 3 (three) times daily as needed. 30 capsule 0  . azithromycin (ZITHROMAX) 250 MG tablet Take 2 tablets by mouth on day 1, followed by 1 tablet by mouth daily for 4 days. 6 tablet 0  . fluticasone (FLONASE) 50 MCG/ACT nasal spray Place 2 sprays into both nostrils daily. (Patient not taking: Reported on 04/28/2015) 16 g 2   No facility-administered medications prior to visit.    No Known Allergies  Review of Systems  Constitutional: Negative for fever and malaise/fatigue.  HENT: Negative for congestion and ear pain.   Eyes: Negative for blurred vision.  Respiratory: Positive for cough and sputum production. Negative for shortness of breath and wheezing.   Cardiovascular: Positive for chest pain. Negative for palpitations and leg swelling.  Gastrointestinal: Negative  for nausea, abdominal pain and blood in stool.  Genitourinary: Negative for dysuria and frequency.  Musculoskeletal: Negative for falls.  Skin: Negative for rash.  Neurological: Negative for dizziness, loss of consciousness and headaches.  Endo/Heme/Allergies: Negative for environmental allergies.  Psychiatric/Behavioral: Negative for depression. The patient is not nervous/anxious.        Objective:    Physical Exam  Constitutional: She is oriented to person, place, and time. She appears well-developed and well-nourished. No distress.  HENT:  Head: Normocephalic and atraumatic.  Eyes: Conjunctivae are normal.  Neck: Neck supple. No thyromegaly  present.  Cardiovascular: Normal rate, regular rhythm and normal heart sounds.   No murmur heard. Pulmonary/Chest: Effort normal and breath sounds normal. No respiratory distress.  Abdominal: Soft. Bowel sounds are normal. She exhibits no distension and no mass. There is no tenderness.  Musculoskeletal: She exhibits no edema.  Lymphadenopathy:    She has no cervical adenopathy.  Neurological: She is alert and oriented to person, place, and time.  Skin: Skin is warm and dry.  Psychiatric: She has a normal mood and affect. Her behavior is normal.    BP 122/78 mmHg  Pulse 63  Temp(Src) 98.2 F (36.8 C) (Oral)  Ht 5\' 7"  (1.702 m)  Wt 128 lb 4 oz (58.174 kg)  BMI 20.08 kg/m2  SpO2 97% Wt Readings from Last 3 Encounters:  04/28/15 128 lb 4 oz (58.174 kg)  12/31/14 124 lb 3.2 oz (56.337 kg)  04/27/14 129 lb (58.514 kg)     Lab Results  Component Value Date   WBC 7.2 12/31/2014   HGB 13.3 12/31/2014   HCT 39.7 12/31/2014   PLT 333.0 12/31/2014   GLUCOSE 83 12/31/2014   CHOL 203* 11/24/2014   TRIG 279* 11/24/2014   HDL 84* 11/24/2014   LDLDIRECT 122.1 12/24/2012   LDLCALC 103 11/24/2014   ALT 46* 12/31/2014   AST 51* 12/31/2014   NA 135 12/31/2014   K 4.0 12/31/2014   CL 98 12/31/2014   CREATININE 0.84 12/31/2014   BUN 13 12/31/2014   CO2 30 12/31/2014   TSH 0.39 12/24/2012    Lab Results  Component Value Date   TSH 0.39 12/24/2012   Lab Results  Component Value Date   WBC 7.2 12/31/2014   HGB 13.3 12/31/2014   HCT 39.7 12/31/2014   MCV 90.1 12/31/2014   PLT 333.0 12/31/2014   Lab Results  Component Value Date   NA 135 12/31/2014   K 4.0 12/31/2014   CO2 30 12/31/2014   GLUCOSE 83 12/31/2014   BUN 13 12/31/2014   CREATININE 0.84 12/31/2014   BILITOT 0.3 12/31/2014   ALKPHOS 97 12/31/2014   AST 51* 12/31/2014   ALT 46* 12/31/2014   PROT 8.0 12/31/2014   ALBUMIN 4.3 12/31/2014   CALCIUM 9.5 12/31/2014   GFR 80.61 12/31/2014   Lab Results    Component Value Date   CHOL 203* 11/24/2014   Lab Results  Component Value Date   HDL 84* 11/24/2014   Lab Results  Component Value Date   LDLCALC 103 11/24/2014   Lab Results  Component Value Date   TRIG 279* 11/24/2014   Lab Results  Component Value Date   CHOLHDL 3 12/24/2012   No results found for: HGBA1C     Assessment & Plan:   Problem List Items Addressed This Visit    Pleurisy    Started on Meloxicam 15 mg daily prn pain      Hypothyroidism  On Levothyroxine, still 88 mcg, has been following with her GYN for labwork will request records      HTN (hypertension), benign    Well controlled, no changes to meds. Encouraged heart healthy diet such as the DASH diet and exercise as tolerated.       Acute bronchitis - Primary    Likely viral will treat with Vitamin C, elderberry and probiotics, will treat with Azithromycin         I have discontinued Ms. Dress's fluticasone, benzonatate, and azithromycin. I am also having her start on meloxicam and azithromycin. Additionally, I am having her maintain her levothyroxine.  Meds ordered this encounter  Medications  . meloxicam (MOBIC) 15 MG tablet    Sig: Take 1 tablet (15 mg total) by mouth daily as needed for pain.    Dispense:  30 tablet    Refill:  1  . azithromycin (ZITHROMAX) 250 MG tablet    Sig: Take 2 tablets the first day then one tablet every day until complete.    Dispense:  6 tablet    Refill:  0     Danise Edge, MD

## 2015-04-28 NOTE — Assessment & Plan Note (Addendum)
On Levothyroxine, still 88 mcg, has been following with her GYN for labwork will request records

## 2015-04-28 NOTE — Telephone Encounter (Signed)
Called the patient regarding a form PCP was to complete for scuba diving. Informed the patient PCP has not seen her since 2014 and would need to have labs/TSH checked or provide proof of treatment elsewhere before she could complete form. Patient preferred to go ahead and schedule appt. Today( open) at 1:15

## 2015-04-28 NOTE — Progress Notes (Signed)
Pre visit review using our clinic review tool, if applicable. No additional management support is needed unless otherwise documented below in the visit note. 

## 2015-04-28 NOTE — Assessment & Plan Note (Signed)
Well controlled, no changes to meds. Encouraged heart healthy diet such as the DASH diet and exercise as tolerated.  °

## 2015-04-28 NOTE — Assessment & Plan Note (Signed)
Started on Meloxicam 15 mg daily prn pain

## 2015-07-08 ENCOUNTER — Telehealth: Payer: Self-pay | Admitting: Family Medicine

## 2015-07-08 NOTE — Telephone Encounter (Signed)
[place] found an irratated lung lining. PCP aware of this and she is still being bothered by it-- symptoms are left lower lung hurts, some SOB when exerting hersel Example: vaccuming or carrying her child around. Just the ongoing issue is this normal, will it go away??? How long does Pleurisy last, symptoms??  Tomorrow is just pool training, so actually diving is not until November.

## 2015-07-08 NOTE — Telephone Encounter (Signed)
°  Relationship to patient: Self Can be reached: [phone]   Reason for call: Patient would like to speak with Dr. Melynda Kellerr CMA in ref to her lung issue and she is going [employer] tomorrow. She wants to make sure it is safe to go.

## 2015-07-08 NOTE — Telephone Encounter (Signed)
She should come in so we can do a new exam and proceed with imaging since it is persistent

## 2015-07-11 ENCOUNTER — Telehealth: Payer: Self-pay

## 2015-07-11 ENCOUNTER — Telehealth: Payer: Self-pay | Admitting: Family Medicine

## 2015-07-11 NOTE — Telephone Encounter (Signed)
Relation to ZO:XWRUpt:self Call back number:[phone]   Reason for call:  Patient transferred to [facility] due to lung pain

## 2015-07-11 NOTE — Telephone Encounter (Signed)
Patient is requesting to come to our office. But she is unsure on wanting to wait on the times we have for her to be able to get in. She is going to think about it and get back to us.

## 2015-07-11 NOTE — Telephone Encounter (Signed)
[facility] - Client TELEPHONE ADVICE RECORD [facility]  Patient Name: Marisa Contreras  DOB: 1975/06/19    Initial Comment Caller states she has Plursey, very painful.    Nurse Assessment  Nurse: Dorthula RuePatten, RN, Enrique SackKendra Date/Time (Eastern Time): 07/11/2015 1:45:38 PM  Confirm and document reason for call. If symptomatic, describe symptoms. You must click the next button to save text entered. ---Caller states she is having a dry cough. She states this happened in March and she went to Urgent Care and she had irritated lung on x-ray. She states 3 months later the pain is getting worse in her chest. She states the pain is in her rib/lung area  Has the patient traveled out of the country within the last 30 days? ---No  Does the patient have any new or worsening symptoms? ---Yes  Will a triage be completed? ---Yes  Related visit to physician within the last 2 weeks? ---No  Does the PT have any chronic conditions? (i.e. diabetes, asthma, etc.) ---Yes  List chronic conditions. ---Thyroid Issues  Is the patient pregnant or possibly pregnant? (Ask all females between the ages of 1612-55) ---No  Is this a behavioral health or substance abuse call? ---No     Guidelines    Guideline Title Affirmed Question Affirmed Notes  Chest Pain [1] Chest pain(s) lasting a few seconds AND [2] persists > 3 days    Final Disposition User   See PCP When Office is Open (within 3 days) Dorthula RuePatten, RN, Enrique SackKendra    Comments  Scheduled with Malva Coganody Martin on 07/11 at 1115   Referrals  [facility]   Disagree/Comply: Comply

## 2015-07-11 NOTE — Telephone Encounter (Signed)
Pt has an appt with Marcelline MatesWilliam Martin, PA-C tomorrow (07/12/15) at 11:15 am

## 2015-07-12 ENCOUNTER — Ambulatory Visit (HOSPITAL_BASED_OUTPATIENT_CLINIC_OR_DEPARTMENT_OTHER): Payer: Managed Care, Other (non HMO)

## 2015-07-12 ENCOUNTER — Ambulatory Visit: Payer: Managed Care, Other (non HMO) | Admitting: Physician Assistant

## 2015-07-12 ENCOUNTER — Encounter: Payer: Self-pay | Admitting: Physician Assistant

## 2015-07-12 ENCOUNTER — Ambulatory Visit (INDEPENDENT_AMBULATORY_CARE_PROVIDER_SITE_OTHER): Payer: Managed Care, Other (non HMO) | Admitting: Physician Assistant

## 2015-07-12 VITALS — BP 120/86 | HR 81 | Temp 98.0°F | Resp 16 | Ht 67.0 in | Wt 127.0 lb

## 2015-07-12 DIAGNOSIS — R0781 Pleurodynia: Secondary | ICD-10-CM | POA: Diagnosis not present

## 2015-07-12 LAB — CBC
HEMATOCRIT: 35.8 % — AB (ref 36.0–46.0)
Hemoglobin: 12.1 g/dL (ref 12.0–15.0)
MCHC: 34 g/dL (ref 30.0–36.0)
MCV: 88.3 fl (ref 78.0–100.0)
Platelets: 213 10*3/uL (ref 150.0–400.0)
RBC: 4.05 Mil/uL (ref 3.87–5.11)
RDW: 12.5 % (ref 11.5–15.5)
WBC: 4.4 10*3/uL (ref 4.0–10.5)

## 2015-07-12 LAB — COMPREHENSIVE METABOLIC PANEL
ALT: 27 U/L (ref 0–35)
AST: 35 U/L (ref 0–37)
Albumin: 4.2 g/dL (ref 3.5–5.2)
Alkaline Phosphatase: 79 U/L (ref 39–117)
BUN: 14 mg/dL (ref 6–23)
CALCIUM: 9.4 mg/dL (ref 8.4–10.5)
CO2: 30 meq/L (ref 19–32)
Chloride: 100 mEq/L (ref 96–112)
Creatinine, Ser: 0.91 mg/dL (ref 0.40–1.20)
GFR: 73.3 mL/min (ref >60.00)
Glucose, Bld: 90 mg/dL (ref 70–99)
POTASSIUM: 4.2 meq/L (ref 3.5–5.1)
Sodium: 134 mEq/L — ABNORMAL LOW (ref 135–145)
Total Bilirubin: 0.4 mg/dL (ref 0.2–1.2)
Total Protein: 7.4 g/dL (ref 6.0–8.3)

## 2015-07-12 LAB — SEDIMENTATION RATE: SED RATE: 1 mm/h (ref 0–20)

## 2015-07-12 NOTE — Telephone Encounter (Signed)
Pt saw Malva CoganCody Martin today (07/12/15) at 11:15 am.

## 2015-07-12 NOTE — Progress Notes (Signed)
Pre visit review using our clinic review tool, if applicable. No additional management support is needed unless otherwise documented below in the visit note/SLS  

## 2015-07-12 NOTE — Patient Instructions (Signed)
Your exam and vitals look good today. Please go to the lab for blood work. You will be contacted for a CT scan to assess further. I will call with results.  Please continue Meloxicam as directed.

## 2015-07-13 ENCOUNTER — Telehealth: Payer: Self-pay | Admitting: *Deleted

## 2015-07-13 ENCOUNTER — Other Ambulatory Visit: Payer: Self-pay | Admitting: Physician Assistant

## 2015-07-13 ENCOUNTER — Ambulatory Visit (HOSPITAL_BASED_OUTPATIENT_CLINIC_OR_DEPARTMENT_OTHER)
Admission: RE | Admit: 2015-07-13 | Discharge: 2015-07-13 | Disposition: A | Discharge status: Home / Self Care | Payer: Managed Care, Other (non HMO) | Source: Ambulatory Visit | Attending: Physician Assistant | Admitting: Physician Assistant

## 2015-07-13 DIAGNOSIS — S2239XD Fracture of one rib, unspecified side, subsequent encounter for fracture with routine healing: Secondary | ICD-10-CM

## 2015-07-13 DIAGNOSIS — S2232XA Fracture of one rib, left side, initial encounter for closed fracture: Secondary | ICD-10-CM | POA: Diagnosis not present

## 2015-07-13 DIAGNOSIS — X58XXXA Exposure to other specified factors, initial encounter: Secondary | ICD-10-CM | POA: Insufficient documentation

## 2015-07-13 DIAGNOSIS — R0781 Pleurodynia: Secondary | ICD-10-CM | POA: Diagnosis present

## 2015-07-13 LAB — ANA: Anti Nuclear Antibody(ANA): POSITIVE — AB

## 2015-07-13 LAB — ANTI-NUCLEAR AB-TITER (ANA TITER): ANA Titer 1: 1:80 {titer} — ABNORMAL HIGH

## 2015-07-13 MED ORDER — HYDROCODONE-ACETAMINOPHEN 10-325 MG PO TABS: 1.0000 | ORAL_TABLET | Freq: Three times a day (TID) | ORAL | Status: DC | PRN

## 2015-07-13 NOTE — Telephone Encounter (Signed)
Please let patient know she will have to return to lab. Can be at her convenience. [facility] her [place]. Ok to place order for PTH -- dx: rib fracture.

## 2015-07-13 NOTE — Telephone Encounter (Signed)
Per Wilkie AyeKristy, the PTH cannot be added added because it must be frozen/SLS 07/12

## 2015-07-14 ENCOUNTER — Other Ambulatory Visit: Payer: Self-pay | Admitting: *Deleted

## 2015-07-14 ENCOUNTER — Other Ambulatory Visit: Payer: Self-pay | Admitting: Physician Assistant

## 2015-07-14 DIAGNOSIS — S2239XD Fracture of one rib, unspecified side, subsequent encounter for fracture with routine healing: Secondary | ICD-10-CM

## 2015-07-14 DIAGNOSIS — R768 Other specified abnormal immunological findings in serum: Secondary | ICD-10-CM

## 2015-07-14 NOTE — Telephone Encounter (Signed)
Pt notified and verbalized understanding. Future labs ordered and [facility].

## 2015-07-15 ENCOUNTER — Other Ambulatory Visit: Payer: Managed Care, Other (non HMO)

## 2015-07-15 DIAGNOSIS — S2239XD Fracture of one rib, unspecified side, subsequent encounter for fracture with routine healing: Secondary | ICD-10-CM

## 2015-07-17 NOTE — Progress Notes (Signed)
Patient presents to clinic today c/o several months of pleuritic chest pain following a bout with bronchitis. States she was diagnosed with pleurisy at an UC and has been taking Mobic since to help with pain. Pain is left-sided and constant with worsening with inspiration or coughing. Denies SOB, cough, chest congestion. Denies trauma or injury to the chest. Denies rash. Denies history of CT or sternal surgery.   Past Medical History  Diagnosis Date  . Chicken pox 09/08/2010  . Second degree burn of arm 09/12/2010  . Hypertension   . Thyroid disease   . Neck pain, acute 12/11/2010  . Visit for gynecologic examination 12/11/2010  . Headache(784.0) 12/11/2010  . Elevated liver function tests 04/16/2011  . Preventative health care 12/11/2010    Had Tetanus in 2007 per patient   . Shoulder pain, right 12/11/2010  . Sinusitis 01/07/2012  . Neck pain 01/20/2012  . Vaginal Pap smear, abnormal   . Hypothyroidism   . Pleurisy 04/28/2015  . Acute bronchitis 04/28/2015  . History of chicken pox 09/08/2010    Current Outpatient Prescriptions on File Prior to Visit  Medication Sig Dispense Refill  . levothyroxine (SYNTHROID, LEVOTHROID) 88 MCG tablet Take 1 tablet (88 mcg total) by mouth daily before breakfast. 90 tablet 3   No current facility-administered medications on file prior to visit.    No Known Allergies  Family History  Problem Relation Age of Onset  . Hypertension Father   . Diabetes Father   . Thyroid disease Maternal Grandmother   . Hyperlipidemia Maternal Grandmother   . Heart disease Maternal Grandfather   . Alcohol abuse Paternal Grandmother   . Heart attack Paternal Grandfather   . Heart disease Paternal Grandfather     Social History   Social History  . Marital Status: Married    Spouse Name: N/A  . Number of Children: N/A  . Years of Education: N/A   Social History Main Topics  . Smoking status: Former Smoker    Types: Cigarettes    Quit date: 01/01/2006  .  Smokeless tobacco: Never Used  . Alcohol Use: No     Comment: 2 bottle of wine a week  . Drug Use: No  . Sexual Activity:    Partners: Male   Other Topics Concern  . None   Social History Narrative    Review of Systems - See HPI.  All other ROS are negative.  BP 120/86 mmHg  Pulse 81  Temp(Src) 98 F (36.7 C) (Oral)  Resp 16  Ht '5\' 7"'$  (1.702 m)  Wt 127 lb (57.607 kg)  BMI 19.89 kg/m2  SpO2 99%  LMP 06/17/2015  Physical Exam  Constitutional: She is oriented to person, place, and time and well-developed, well-nourished, and in no distress.  HENT:  Head: Normocephalic and atraumatic.  Eyes: Conjunctivae are normal.  Neck: Neck supple.  Cardiovascular: Normal rate, regular rhythm, normal heart sounds and intact distal pulses.   Pulmonary/Chest: Effort normal and breath sounds normal. No respiratory distress. She has no wheezes. She has no rales. She exhibits no tenderness.  Neurological: She is alert and oriented to person, place, and time.  Skin: Skin is warm and dry. No rash noted.  Psychiatric: Affect normal.  Vitals reviewed.   Recent Results (from the past 2160 hour(s))  CBC     Status: Abnormal   Collection Time: 07/12/15 11:58 AM  Result Value Ref Range   WBC 4.4 4.0 - 10.5 K/uL   RBC 4.05 3.87 -  5.11 Mil/uL   Platelets 213.0 150.0 - 400.0 K/uL   Hemoglobin 12.1 12.0 - 15.0 g/dL   HCT 35.8 (L) 36.0 - 46.0 %   MCV 88.3 78.0 - 100.0 fl   MCHC 34.0 30.0 - 36.0 g/dL   RDW 12.5 11.5 - 15.5 %  Sed Rate (ESR)     Status: None   Collection Time: 07/12/15 11:58 AM  Result Value Ref Range   Sed Rate 1 0 - 20 mm/hr  Comp Met (CMET)     Status: Abnormal   Collection Time: 07/12/15 11:58 AM  Result Value Ref Range   Sodium 134 (L) 135 - 145 mEq/L   Potassium 4.2 3.5 - 5.1 mEq/L   Chloride 100 96 - 112 mEq/L   CO2 30 19 - 32 mEq/L   Glucose, Bld 90 70 - 99 mg/dL   BUN 14 6 - 23 mg/dL   Creatinine, Ser 0.91 0.40 - 1.20 mg/dL   Total Bilirubin 0.4 0.2 - 1.2 mg/dL     Alkaline Phosphatase 79 39 - 117 U/L   AST 35 0 - 37 U/L   ALT 27 0 - 35 U/L   Total Protein 7.4 6.0 - 8.3 g/dL   Albumin 4.2 3.5 - 5.2 g/dL   Calcium 9.4 8.4 - 10.5 mg/dL   GFR 73.30 >60.00 mL/min  ANA     Status: Abnormal   Collection Time: 07/12/15 11:58 AM  Result Value Ref Range   Anit Nuclear Antibody(ANA) POS (A) NEGATIVE  Anti-nuclear ab-titer (ANA titer)     Status: Abnormal   Collection Time: 07/12/15 11:58 AM  Result Value Ref Range   ANA Pattern 1 HOMOGENEOUS (A)    ANA Titer 1 1:80 (H) titer    Comment:           Reference Range           < 1:40      Negative             1:40-1:80 Low Antibody level           > 1:80      Elevated Antibody level     Assessment/Plan: 1. Pleuritic chest pain Needs further workup. Vitals are stable. Very low suspicion for PE (Wells Score 0). Pleurisy versus Fracture versus Muscular pain. Will obtain CBC, ESR, CMP and ANA. Will obtain CT Chest today to further assess. Pain medication and supportive measures reviewed.  - CT Chest Wo Contrast; Future - CBC - Sed Rate (ESR) - Comp Met (CMET) - ANA   Leeanne Rio, PA-C

## 2015-07-19 LAB — PARATHYROID HORMONE, INTACT (NO CA)

## 2015-08-02 ENCOUNTER — Encounter: Payer: Self-pay | Admitting: Family Medicine

## 2015-08-02 ENCOUNTER — Ambulatory Visit (INDEPENDENT_AMBULATORY_CARE_PROVIDER_SITE_OTHER): Payer: Managed Care, Other (non HMO) | Admitting: Family Medicine

## 2015-08-02 VITALS — BP 126/86 | HR 67 | Temp 97.8°F | Ht 67.0 in | Wt 125.4 lb

## 2015-08-02 DIAGNOSIS — R091 Pleurisy: Secondary | ICD-10-CM

## 2015-08-02 DIAGNOSIS — R0789 Other chest pain: Secondary | ICD-10-CM | POA: Diagnosis not present

## 2015-08-02 DIAGNOSIS — R5383 Other fatigue: Secondary | ICD-10-CM

## 2015-08-02 DIAGNOSIS — I10 Essential (primary) hypertension: Secondary | ICD-10-CM

## 2015-08-02 DIAGNOSIS — R7989 Other specified abnormal findings of blood chemistry: Secondary | ICD-10-CM | POA: Diagnosis not present

## 2015-08-02 DIAGNOSIS — R079 Chest pain, unspecified: Secondary | ICD-10-CM

## 2015-08-02 DIAGNOSIS — E038 Other specified hypothyroidism: Secondary | ICD-10-CM

## 2015-08-02 DIAGNOSIS — R945 Abnormal results of liver function studies: Secondary | ICD-10-CM

## 2015-08-02 DIAGNOSIS — S2239XD Fracture of one rib, unspecified side, subsequent encounter for fracture with routine healing: Secondary | ICD-10-CM

## 2015-08-02 DIAGNOSIS — R9431 Abnormal electrocardiogram [ECG] [EKG]: Secondary | ICD-10-CM

## 2015-08-02 HISTORY — DX: Chest pain, unspecified: R07.9

## 2015-08-02 LAB — TROPONIN I: TNIDX: 0 ug/l (ref 0.00–0.06)

## 2015-08-02 LAB — COMPREHENSIVE METABOLIC PANEL
ALT: 37 U/L — AB (ref 0–35)
AST: 50 U/L — ABNORMAL HIGH (ref 0–37)
Albumin: 4.6 g/dL (ref 3.5–5.2)
Alkaline Phosphatase: 100 U/L (ref 39–117)
BUN: 10 mg/dL (ref 6–23)
CO2: 26 mEq/L (ref 19–32)
Calcium: 9.5 mg/dL (ref 8.4–10.5)
Chloride: 97 mEq/L (ref 96–112)
Creatinine, Ser: 0.82 mg/dL (ref 0.40–1.20)
GFR: 82.63 mL/min (ref >60.00)
GLUCOSE: 82 mg/dL (ref 70–99)
POTASSIUM: 4 meq/L (ref 3.5–5.1)
Sodium: 134 mEq/L — ABNORMAL LOW (ref 135–145)
Total Bilirubin: 0.6 mg/dL (ref 0.2–1.2)
Total Protein: 8.3 g/dL (ref 6.0–8.3)

## 2015-08-02 LAB — CBC WITH DIFFERENTIAL/PLATELET
BASOS ABS: 0 10*3/uL (ref 0.0–0.1)
Basophils Relative: 0.5 % (ref 0.0–3.0)
Eosinophils Absolute: 0 10*3/uL (ref 0.0–0.7)
Eosinophils Relative: 0.3 % (ref 0.0–5.0)
HCT: 38 % (ref 36.0–46.0)
Hemoglobin: 12.9 g/dL (ref 12.0–15.0)
LYMPHS ABS: 1.8 10*3/uL (ref 0.7–4.0)
Lymphocytes Relative: 40.5 % (ref 12.0–46.0)
MCHC: 34 g/dL (ref 30.0–36.0)
MCV: 88.5 fl (ref 78.0–100.0)
MONOS PCT: 8.6 % (ref 3.0–12.0)
Monocytes Absolute: 0.4 10*3/uL (ref 0.1–1.0)
NEUTROS ABS: 2.2 10*3/uL (ref 1.4–7.7)
NEUTROS PCT: 50.1 % (ref 43.0–77.0)
Platelets: 248 10*3/uL (ref 150.0–400.0)
RBC: 4.29 Mil/uL (ref 3.87–5.11)
RDW: 12.5 % (ref 11.5–15.5)
WBC: 4.4 10*3/uL (ref 4.0–10.5)

## 2015-08-02 LAB — TSH: TSH: 0.86 u[IU]/mL (ref 0.35–4.50)

## 2015-08-02 LAB — SEDIMENTATION RATE: Sed Rate: 13 mm/hr (ref 0–20)

## 2015-08-02 MED ORDER — ALPRAZOLAM 0.25 MG PO TABS: 0.2500 mg | ORAL_TABLET | Freq: Two times a day (BID) | ORAL | 0 refills | Status: DC | PRN

## 2015-08-02 MED ORDER — TRAMADOL HCL 50 MG PO TABS: 50.0000 mg | ORAL_TABLET | Freq: Three times a day (TID) | ORAL | 0 refills | Status: DC | PRN

## 2015-08-02 NOTE — Patient Instructions (Signed)

## 2015-08-03 LAB — D-DIMER, QUANTITATIVE: D-Dimer, Quant: 0.3 mcg/mL FEU (ref <0.50)

## 2015-08-04 LAB — PARATHYROID HORMONE, INTACT (NO CA): PTH: 79 pg/mL — ABNORMAL HIGH (ref 14–64)

## 2015-08-05 ENCOUNTER — Other Ambulatory Visit: Payer: Self-pay | Admitting: Family Medicine

## 2015-08-05 DIAGNOSIS — R7989 Other specified abnormal findings of blood chemistry: Secondary | ICD-10-CM

## 2015-08-14 DIAGNOSIS — S2239XA Fracture of one rib, unspecified side, initial encounter for closed fracture: Secondary | ICD-10-CM | POA: Insufficient documentation

## 2015-08-14 NOTE — Assessment & Plan Note (Addendum)
Has spread from site of rib fracture to across front of chest. EKG only shows bradycardia. Atypical chest pain so encouraged topical treatments for now and referred to cardiology for evaluation and reassiramce.

## 2015-08-14 NOTE — Progress Notes (Signed)
Patient ID: Marisa Contreras, female   DOB: 10-03-1975, 40 y.o.   MRN: [id number]   Subjective:    Patient ID: Marisa Contreras, female    DOB: 1975/07/10, 40 y.o.   MRN: [id number]  Chief Complaint  Patient presents with  . Follow-up    HPI Patient is in today for evaluation of pain secondary to left 8 th rib fracture. No mechanism for fracture is noted but she does do strenuous yoga. Her concern today is symptoms are worsening. She has left chest wall pain but it is spreading across front of chest. She denies any palpitations but notes some nausea when she takes the hydrocodone given for the pain. She also notes shallow respirations due to pain. Also notes anxiety and anhedonia today.   Past Medical History:  Diagnosis Date  . Acute bronchitis 04/28/2015  . Chest pain at rest 08/02/2015  . Chicken pox 09/08/2010  . Elevated liver function tests 04/16/2011  . Headache(784.0) 12/11/2010  . History of chicken pox 09/08/2010  . Hypertension   . Hypothyroidism   . Neck pain 01/20/2012  . Neck pain, acute 12/11/2010  . Pleurisy 04/28/2015  . Preventative health care 12/11/2010   Had Tetanus in 2007 per patient   . Second degree burn of arm 09/12/2010  . Shoulder pain, right 12/11/2010  . Sinusitis 01/07/2012  . Thyroid disease   . Vaginal Pap smear, abnormal   . Visit for gynecologic examination 12/11/2010    Past Surgical History:  Procedure Laterality Date  . GYNECOLOGIC CRYOSURGERY    . TRUNK SKIN LESION EXCISIONAL BIOPSY     abd, benign    Family History  Problem Relation Age of Onset  . Hypertension Father   . Diabetes Father   . Thyroid disease Maternal Grandmother   . Hyperlipidemia Maternal Grandmother   . Heart disease Maternal Grandfather   . Alcohol abuse Paternal Grandmother   . Heart attack Paternal Grandfather   . Heart disease Paternal Grandfather     Social History   Social History  . Marital status: Married    Spouse name: N/A  . Number of children: N/A  .  Years of education: N/A   Occupational History  . Not on file.   Social History Main Topics  . Smoking status: Former Smoker    Types: Cigarettes    Quit date: 01/01/2006  . Smokeless tobacco: Never Used  . Alcohol use No     Comment: 2 bottle of wine a week  . Drug use: No  . Sexual activity: Yes    Partners: Male   Other Topics Concern  . Not on file   Social History Narrative  . No narrative on file    Outpatient Medications Prior to Visit  Medication Sig Dispense Refill  . levothyroxine (SYNTHROID, LEVOTHROID) 88 MCG tablet Take 1 tablet (88 mcg total) by mouth daily before breakfast. 90 tablet 3  . HYDROcodone-acetaminophen (NORCO) 10-325 MG tablet Take 1 tablet by mouth every 8 (eight) hours as needed. (Patient not taking: Reported on 08/02/2015) 45 tablet 0   No facility-administered medications prior to visit.     No Known Allergies  Review of Systems  Constitutional: Negative for fever and malaise/fatigue.  HENT: Negative for congestion.   Eyes: Negative for blurred vision.  Respiratory: Negative for shortness of breath.   Cardiovascular: Positive for chest pain and palpitations. Negative for leg swelling.  Gastrointestinal: Positive for nausea. Negative for abdominal pain and blood in stool.  Genitourinary: Negative for dysuria  and frequency.  Musculoskeletal: Negative for falls.  Skin: Negative for rash.  Neurological: Negative for dizziness, loss of consciousness and headaches.  Endo/Heme/Allergies: Negative for environmental allergies.  Psychiatric/Behavioral: Positive for depression. The patient is not nervous/anxious.        Objective:    Physical Exam  Constitutional: She is oriented to person, place, and time. She appears well-developed and well-nourished. No distress.  HENT:  Head: Normocephalic and atraumatic.  Nose: Nose normal.  Eyes: Right eye exhibits no discharge. Left eye exhibits no discharge.  Neck: Normal range of motion. Neck supple.    Cardiovascular: Normal rate and regular rhythm.   No murmur heard. Pulmonary/Chest: Effort normal and breath sounds normal. She exhibits tenderness.  Tender with palp over left axillae and anterior upper chest wall.   Abdominal: Soft. Bowel sounds are normal. There is no tenderness.  Musculoskeletal: She exhibits no edema.  Neurological: She is alert and oriented to person, place, and time.  Skin: Skin is warm and dry.  Psychiatric: She has a normal mood and affect.  Nursing note and vitals reviewed.   BP 126/86   Pulse 67   Temp 97.8 F (36.6 C) (Oral)   Ht '5\' 7"'$  (1.702 m)   Wt 125 lb 6 oz (56.9 kg)   LMP 06/17/2015   SpO2 99%   BMI 19.64 kg/m  Wt Readings from Last 3 Encounters:  08/02/15 125 lb 6 oz (56.9 kg)  07/12/15 127 lb (57.6 kg)  04/28/15 128 lb 4 oz (58.2 kg)     Lab Results  Component Value Date   WBC 4.4 08/02/2015   HGB 12.9 08/02/2015   HCT 38.0 08/02/2015   PLT 248.0 08/02/2015   GLUCOSE 82 08/02/2015   CHOL 203 (A) 11/24/2014   TRIG 279 (A) 11/24/2014   HDL 84 (A) 11/24/2014   LDLDIRECT 122.1 12/24/2012   LDLCALC 103 11/24/2014   ALT 37 (H) 08/02/2015   AST 50 (H) 08/02/2015   NA 134 (L) 08/02/2015   K 4.0 08/02/2015   CL 97 08/02/2015   CREATININE 0.82 08/02/2015   BUN 10 08/02/2015   CO2 26 08/02/2015   TSH 0.86 08/02/2015    Lab Results  Component Value Date   TSH 0.86 08/02/2015   Lab Results  Component Value Date   WBC 4.4 08/02/2015   HGB 12.9 08/02/2015   HCT 38.0 08/02/2015   MCV 88.5 08/02/2015   PLT 248.0 08/02/2015   Lab Results  Component Value Date   NA 134 (L) 08/02/2015   K 4.0 08/02/2015   CO2 26 08/02/2015   GLUCOSE 82 08/02/2015   BUN 10 08/02/2015   CREATININE 0.82 08/02/2015   BILITOT 0.6 08/02/2015   ALKPHOS 100 08/02/2015   AST 50 (H) 08/02/2015   ALT 37 (H) 08/02/2015   PROT 8.3 08/02/2015   ALBUMIN 4.6 08/02/2015   CALCIUM 9.5 08/02/2015   GFR 82.63 08/02/2015   Lab Results  Component Value  Date   CHOL 203 (A) 11/24/2014   Lab Results  Component Value Date   HDL 84 (A) 11/24/2014   Lab Results  Component Value Date   LDLCALC 103 11/24/2014   Lab Results  Component Value Date   TRIG 279 (A) 11/24/2014   Lab Results  Component Value Date   CHOLHDL 3 12/24/2012   No results found for: HGBA1C     Assessment & Plan:   Problem List Items Addressed This Visit    Elevated liver function tests  Mild, will monitor. Consider fatty liver and minimize carbohydrates, alcohol and tylenol      HTN (hypertension), benign    Well controlled, no changes to meds. Encouraged heart healthy diet such as the DASH diet and exercise as tolerated.       Relevant Orders   Comp Met (CMET) (Completed)   TSH (Completed)   Ambulatory referral to Cardiology   Hypothyroidism   Pleurisy   Relevant Orders   Ambulatory referral to Cardiology   Chest pain - Primary    Has spread from site of rib fracture to across front of chest. EKG only shows bradycardia. Atypical chest pain so encouraged topical treatments for now and referred to cardiology for evaluation and reassiramce.      Relevant Orders   EKG 12-Lead (Completed)   Sed Rate (ESR) (Completed)   CBC with Differential (Completed)   Troponin I (Completed)   Ambulatory referral to Cardiology   D-Dimer, Quantitative (Completed)   PTH, intact (no Ca) (Completed)   Rib fracture    No clear etiology. PTH elevated, will proceed with PET scan of chest and referral to endocrinology if patient agrees.        Other Visit Diagnoses    Other fatigue       Nonspecific abnormal electrocardiogram (ECG) (EKG)       Relevant Orders   Ambulatory referral to Cardiology      I have discontinued Ms. Polidori's HYDROcodone-acetaminophen. I am also having her start on ALPRAZolam and traMADol. Additionally, I am having her maintain her levothyroxine.  Meds ordered this encounter  Medications  . ALPRAZolam (XANAX) 0.25 MG tablet    Sig:  Take 1 tablet (0.25 mg total) by mouth 2 (two) times daily as needed for anxiety.    Dispense:  10 tablet    Refill:  0  . traMADol (ULTRAM) 50 MG tablet    Sig: Take 1 tablet (50 mg total) by mouth every 8 (eight) hours as needed.    Dispense:  10 tablet    Refill:  0     Penni Homans, MD

## 2015-08-14 NOTE — Assessment & Plan Note (Signed)
Mild, will monitor. Consider fatty liver and minimize carbohydrates, alcohol and tylenol

## 2015-08-14 NOTE — Assessment & Plan Note (Signed)
Well controlled, no changes to meds. Encouraged heart healthy diet such as the DASH diet and exercise as tolerated.  °

## 2015-08-14 NOTE — Assessment & Plan Note (Signed)
No clear etiology. PTH elevated, will proceed with PET scan of chest and referral to endocrinology if patient agrees.

## 2015-08-19 ENCOUNTER — Ambulatory Visit: Payer: Managed Care, Other (non HMO) | Admitting: Cardiovascular Disease

## 2015-08-19 ENCOUNTER — Encounter: Payer: Self-pay | Admitting: Internal Medicine

## 2015-11-09 ENCOUNTER — Encounter: Payer: Self-pay | Admitting: *Deleted

## 2015-11-16 ENCOUNTER — Ambulatory Visit (INDEPENDENT_AMBULATORY_CARE_PROVIDER_SITE_OTHER): Payer: Managed Care, Other (non HMO) | Admitting: Cardiovascular Disease

## 2015-11-16 ENCOUNTER — Encounter: Payer: Self-pay | Admitting: Cardiovascular Disease

## 2015-11-16 VITALS — BP 140/82 | HR 74 | Ht 67.0 in | Wt 129.6 lb

## 2015-11-16 DIAGNOSIS — R9431 Abnormal electrocardiogram [ECG] [EKG]: Secondary | ICD-10-CM

## 2015-11-16 NOTE — Progress Notes (Signed)
     11/16/2015 Harriet MassonJennifer Contreras   06-30-1975  [id number]  Primary Physician Danise EdgeBLYTH, STACEY, MD Primary Cardiologist: Runell GessJonathan J Kiyoto Slomski MD Roseanne RenoFACP, FACC, FAHA, FSCAI  HPI:  Mrs. 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 She was referred by Dr. Danise EdgeStacey Blyth for evaluation of an abnormal EKG. Victorino DikeJennifer has essentially no cardiac risk factors. She is fairly active and does yoga. She did have a fractured rib several months ago contributing to atypical chest pain. She was seen by her PCP who performed an EKG on 39 that showed anteroseptal Q waves and is referred here for further evaluation.   Current Outpatient Prescriptions  Medication Sig Dispense Refill  . levothyroxine (SYNTHROID, LEVOTHROID) 88 MCG tablet Take 1 tablet (88 mcg total) by mouth daily before breakfast. 90 tablet 3   No current facility-administered medications for this visit.     No Known Allergies  Social History   Social History  . Marital status: Married    Spouse name: N/A  . Number of children: N/A  . Years of education: N/A   Occupational History  . Not on file.   Social History Main Topics  . Smoking status: Former Smoker    Types: Cigarettes    Quit date: 01/01/2006  . Smokeless tobacco: Never Used  . Alcohol use No     Comment: 2 bottle of wine a week  . Drug use: No  . Sexual activity: Yes    Partners: Male   Other Topics Concern  . Not on file   Social History Narrative  . No narrative on file     Review of Systems: General: negative for chills, fever, night sweats or weight changes.  Cardiovascular: negative for chest pain, dyspnea on exertion, edema, orthopnea, palpitations, paroxysmal nocturnal dyspnea or shortness of breath Dermatological: negative for rash Respiratory: negative for cough or wheezing Urologic: negative for hematuria Abdominal: negative for nausea, vomiting, diarrhea, bright  red blood per rectum, melena, or hematemesis Neurologic: negative for visual changes, syncope, or dizziness All other systems reviewed and are otherwise negative except as noted above.    Blood pressure 140/82, pulse 74, height 5\' 7"  (1.702 m), weight 129 lb 9.6 oz (58.8 kg).  General appearance: alert and no distress Neck: no adenopathy, no carotid bruit, no JVD, supple, symmetrical, trachea midline and thyroid not enlarged, symmetric, no tenderness/mass/nodules Lungs: clear to auscultation bilaterally Heart: regular rate and rhythm, S1, S2 normal, no murmur, click, rub or gallop Extremities: extremities normal, atraumatic, no cyanosis or edema  EKG sinus rhythm at 74 with septal Q waves. I personally reviewed this EKG  ASSESSMENT AND PLAN:   HTN (hypertension), benign History of hypertension resolved after her pregnancy  Chest pain History of atypical chest pain was likely related to a fractured rib  Abnormal EKG Victorino DikeJennifer was referred for evaluation of an abnormal EKG. This was performed in Dr. Vena RuaBlythe's office on 8/39/17 which showed anteroseptal Q waves consistent with an old anteroseptal myocardial infarction. I suspect this is a normal variant. I'm going to get a 2-D echocardiogram to look for wall motion abnormalities. If this is normal I will see her back as needed.      Runell GessJonathan J. Isabelle Matt MD FACP,FACC,FAHA, [facility]SC[facility] 11/16/2015 3:19 PM

## 2015-11-16 NOTE — Patient Instructions (Signed)
Medication Instructions: Your physician recommends that you continue on your current medications as directed. Please refer to the Current Medication list given to you today.   Testing/Procedures: Your physician has requested that you have an echocardiogram. Echocardiography is a painless test that uses sound waves to create images of your heart. It provides your doctor with information about the size and shape of your heart and how well your heart's chambers and valves are working. This procedure takes approximately one hour. There are no restrictions for this procedure.   Follow-Up: Your physician recommends that you schedule a follow-up appointment as needed with Dr. Berry.  If you need a refill on your cardiac medications before your next appointment, please call your pharmacy.  

## 2015-11-16 NOTE — Assessment & Plan Note (Signed)
History of atypical chest pain was likely related to a fractured rib

## 2015-11-16 NOTE — Assessment & Plan Note (Signed)
History of hypertension resolved after her pregnancy

## 2015-11-16 NOTE — Assessment & Plan Note (Signed)
Marisa DikeJennifer was referred for evaluation of an abnormal EKG. This was performed in Dr. Vena RuaBlythe's office on 08/02/15 which showed anteroseptal Q waves consistent with an old anteroseptal myocardial infarction. I suspect this is a normal variant. I'm going to get a 2-D echocardiogram to look for wall motion abnormalities. If this is normal I will see her back as needed.

## 2015-12-13 ENCOUNTER — Other Ambulatory Visit: Payer: Self-pay

## 2015-12-13 ENCOUNTER — Ambulatory Visit (HOSPITAL_COMMUNITY): Payer: Managed Care, Other (non HMO) | Attending: Cardiovascular Disease

## 2015-12-13 DIAGNOSIS — R9431 Abnormal electrocardiogram [ECG] [EKG]: Secondary | ICD-10-CM | POA: Insufficient documentation

## 2016-01-23 NOTE — Addendum Note (Signed)
Addended by: Alyson InglesBROOME, Tc Kapusta L on: 01/23/2016 08:26 AM   Modules accepted: Orders

## 2016-07-11 ENCOUNTER — Telehealth: Payer: Self-pay | Admitting: Family Medicine

## 2016-07-11 NOTE — Telephone Encounter (Signed)
Pt called in to make pcp aware that she will be faxing over a form to have completed so that she can go scuba  diving

## 2016-07-12 NOTE — Telephone Encounter (Signed)
Medical Release for Scuba Diving forwarded to provider; will mail back to patient upon completion, envelope created/SLS 07/12

## 2016-07-16 NOTE — Telephone Encounter (Signed)
Patient informed paperwork has been completed and has been placed in outgoing mail as instructed; understood & agreed/SLS 07/16

## 2016-09-24 ENCOUNTER — Telehealth: Payer: Self-pay | Admitting: Family Medicine

## 2016-09-24 NOTE — Telephone Encounter (Signed)
Pt would like to know if you could take her on has a new pt due to location  ?    Best number [phone]

## 2016-09-25 NOTE — Telephone Encounter (Signed)
Called pt to make appt, she was in the middle of something and would call back to make an appt

## 2016-09-25 NOTE — Telephone Encounter (Signed)
Yes I will accept 

## 2016-10-09 ENCOUNTER — Encounter: Payer: Self-pay | Admitting: Internal Medicine

## 2016-10-09 ENCOUNTER — Ambulatory Visit (INDEPENDENT_AMBULATORY_CARE_PROVIDER_SITE_OTHER): Payer: 59 | Admitting: Internal Medicine

## 2016-10-09 VITALS — BP 148/100 | HR 81 | Temp 98.9°F | Resp 16 | Wt 127.0 lb

## 2016-10-09 DIAGNOSIS — Z23 Encounter for immunization: Secondary | ICD-10-CM

## 2016-10-09 DIAGNOSIS — R55 Syncope and collapse: Secondary | ICD-10-CM | POA: Diagnosis not present

## 2016-10-09 DIAGNOSIS — I10 Essential (primary) hypertension: Secondary | ICD-10-CM | POA: Diagnosis not present

## 2016-10-09 DIAGNOSIS — F0781 Postconcussional syndrome: Secondary | ICD-10-CM

## 2016-10-09 DIAGNOSIS — E038 Other specified hypothyroidism: Secondary | ICD-10-CM | POA: Diagnosis not present

## 2016-10-09 NOTE — Patient Instructions (Signed)
  Flu immunization administered today.    Medications reviewed and updated.  No changes recommended at this time.    Please followup once a year

## 2016-10-09 NOTE — Progress Notes (Signed)
Subjective:    Patient ID: Marisa Contreras, female    DOB: [birth year], 41 y.o.   MRN: [id number]  HPI She is here to establish with a new pcp.   She is here for an acute visit.   Fall one month ago:  She fell one month ago when she got up in the middle of the night to go to the bathroom.  She woke up on the floor.  She landed on her right cheek bone.  Two weeks after that she vomited a couple of times, but has not vomited since then.  Recently she was lightheaded at yoga.  She gets intermittent lightheadedness, which is not new.  She fainted one other time while in college.  Her mom and sister have had similar episodes.  Sister's episode was related to pain.  She thinks her mom's was related to stress.  She does get some lighhteadedness frequently and with changes in position. She drinks a lot of water. She usually does not get headaches.  She has been having headaches, flash like headaches that are transient.  She has had sensitivity to light.  At times she feels her speech is less fluent - she can not pull up the word that that she wants.  She thought she may have had a concussion.  Elevated blood pressure, h/o htn:  Her BP is elevated today.  She does not think it has been elevated recently.  She does not check it at home, but can.  She is having increased stress.  She denies chest pain, leg swelling, SOB.    Hypothyroidism:  She is taking her medication daily.  She denies any recent changes in energy or weight that are unexplained.  Her medication is monitored by gyn.    Medications and allergies reviewed with patient and updated if appropriate.  Patient Active Problem List   Diagnosis Date Noted  . Abnormal EKG 11/16/2015  . Rib fracture 08/14/2015  . Chest pain 08/02/2015  . Pleurisy 04/28/2015  . Muscle spasms of neck 04/27/2014  . Hypothyroidism 01/05/2013  . HTN (hypertension), benign 11/13/2012  . Elevated liver function tests 04/16/2011  . History of chicken pox 09/08/2010      Current Outpatient Prescriptions on File Prior to Visit  Medication Sig Dispense Refill  . levothyroxine (SYNTHROID, LEVOTHROID) 88 MCG tablet Take 1 tablet (88 mcg total) by mouth daily before breakfast. 90 tablet 3   No current facility-administered medications on file prior to visit.     Past Medical History:  Diagnosis Date  . Acute bronchitis 04/28/2015  . Chest pain at rest 08/02/2015  . Chicken pox 09/08/2010  . Elevated liver function tests 04/16/2011  . Headache(784.0) 12/11/2010  . History of chicken pox 09/08/2010  . Hypertension   . Hypothyroidism   . Neck pain 01/20/2012  . Neck pain, acute 12/11/2010  . Pleurisy 04/28/2015  . Preventative health care 12/11/2010   Had Tetanus in 2007 per patient   . Second degree burn of arm 09/12/2010  . Shoulder pain, right 12/11/2010  . Sinusitis 01/07/2012  . Thyroid disease   . Vaginal Pap smear, abnormal   . Visit for gynecologic examination 12/11/2010    Past Surgical History:  Procedure Laterality Date  . GYNECOLOGIC CRYOSURGERY    . TRUNK SKIN LESION EXCISIONAL BIOPSY     abd, benign    Social History   Social History  . Marital status: Married    Spouse name: N/A  . Number of children:  N/A  . Years of education: N/A   Social History Main Topics  . Smoking status: Former Smoker    Types: Cigarettes    Quit date: 01/01/2006  . Smokeless tobacco: Never Used  . Alcohol use No     Comment: 2 bottle of wine a week  . Drug use: No  . Sexual activity: Yes    Partners: Male   Other Topics Concern  . Not on file   Social History Narrative  . No narrative on file    Family History  Problem Relation Age of Onset  . Hypertension Father   . Diabetes Father   . Thyroid disease Maternal Grandmother   . Hyperlipidemia Maternal Grandmother   . Heart disease Maternal Grandfather   . Alcohol abuse Paternal Grandmother   . Heart attack Paternal Grandfather   . Heart disease Paternal Grandfather     Review of Systems   Constitutional: Negative for appetite change, chills, fatigue and fever.  Eyes: Positive for photophobia. Negative for visual disturbance.  Respiratory: Negative for cough, shortness of breath and wheezing.        Sometimes feels like she can't get a deep breath in with strenuous exertion  Cardiovascular: Positive for palpitations (occ with working out). Negative for chest pain and leg swelling.  Gastrointestinal: Negative for abdominal pain, blood in stool, constipation, diarrhea and nausea (last episode two weeks ago).  Musculoskeletal: Positive for neck pain.  Neurological: Positive for light-headedness and headaches (transient). Negative for dizziness.  Psychiatric/Behavioral: Negative for confusion, decreased concentration and dysphoric mood. The patient is nervous/anxious.        Objective:   Vitals:   10/09/16 1429  BP: (!) 148/100  Pulse: 81  Resp: 16  Temp: 98.9 F (37.2 C)  SpO2: 99%   Filed Weights   10/09/16 1429  Weight: 127 lb (57.6 kg)   Body mass index is 19.89 kg/m.  Wt Readings from Last 3 Encounters:  10/09/16 127 lb (57.6 kg)  11/16/15 129 lb 9.6 oz (58.8 kg)  08/02/15 125 lb 6 oz (56.9 kg)     Physical Exam  Constitutional: She is oriented to person, place, and time. She appears well-developed and well-nourished. No distress.  HENT:  Head: Normocephalic and atraumatic.  Right Ear: External ear normal.  Left Ear: External ear normal.  Mouth/Throat: Oropharynx is clear and moist.  Tenderness and swelling over right upper cheek/lower orbit w/o bruising  Eyes: Conjunctivae are normal.  Neck: Neck supple. No tracheal deviation present. No thyromegaly present.  Cardiovascular: Normal rate and regular rhythm.   No murmur heard. Pulmonary/Chest: Effort normal and breath sounds normal. No respiratory distress. She has no wheezes. She has no rales.  Abdominal: Soft. She exhibits no distension. There is no tenderness. There is no rebound.   Musculoskeletal: She exhibits no edema.  Lymphadenopathy:    She has no cervical adenopathy.  Neurological: She is alert and oriented to person, place, and time. No cranial nerve deficit.  Skin: Skin is warm and dry. She is not diaphoretic. No erythema.  Psychiatric: She has a normal mood and affect. Her behavior is normal. Thought content normal.          Assessment & Plan:   See Problem List for Assessment and Plan of chronic medical problems.

## 2016-10-09 NOTE — Assessment & Plan Note (Signed)
Some of her symptoms are consistent with a concussion from her fall one month ago Symptoms improving and mild Discussed evaluation at the concussion clinic but she declined Monitor symptoms - if they do not improve over the next few weeks she needs to be re-evaluated - decrease activities if symptoms increase

## 2016-10-09 NOTE — Assessment & Plan Note (Signed)
Managed by gyn

## 2016-10-09 NOTE — Assessment & Plan Note (Signed)
Had htn prior to pregnancy - none since Elevated BP here today Advised her to start monitoring her BP at home Low sodium diet Work on decreasing stress level Return if BP elevated

## 2016-10-09 NOTE — Assessment & Plan Note (Signed)
Likely vasovagal Monitor BP Stay well hydrated Avoid getting up quickly - sit on edge of bed Has seen cardio last year - discussed re-evaluation, but we both feel we can hold off on that If occurs again she needs to have cardiac evaluation

## 2016-10-11 ENCOUNTER — Telehealth: Payer: Self-pay | Admitting: Internal Medicine

## 2016-10-11 MED ORDER — LOSARTAN POTASSIUM 25 MG PO TABS: 25.0000 mg | ORAL_TABLET | Freq: Every day | ORAL | 5 refills | Status: DC

## 2016-10-11 NOTE — Telephone Encounter (Signed)
Pt called stating that she has been checking her BP throughout the day and it has continued to be high (averaging around 150/100). She is also expereicning headaches. She wanted to know if something could be sent in to [facility] on [address].  She would like a call back.

## 2016-10-11 NOTE — Telephone Encounter (Signed)
Sent.  Have her monitor her BP and let us know how it is

## 2016-10-11 NOTE — Telephone Encounter (Signed)
Called pt no answer LMOM w/MD response../lm,b 

## 2016-12-12 ENCOUNTER — Other Ambulatory Visit: Payer: Self-pay | Admitting: Obstetrics & Gynecology

## 2016-12-12 DIAGNOSIS — N6310 Unspecified lump in the right breast, unspecified quadrant: Secondary | ICD-10-CM

## 2016-12-14 ENCOUNTER — Telehealth: Payer: Self-pay | Admitting: Internal Medicine

## 2016-12-14 NOTE — Telephone Encounter (Signed)
Copied from CRM [id number]. Topic: Complaint - Billing/Coding >> Dec 14, 2016  3:49 PM Anice PaganiniMunoz, Aisia Correira I, [place] wrote: Patient name/MRN/Acct [id number] DOS Apr 01, 1975 Details of complaint pt call because she have her yearly Wellness and [facility]   How would the patient like to see it resolved some body call her and fix it On a scale of 1-10, how was your experience What would it take to bring it to a 10 call the pt  Will automatically be routed to [facility]oding pool.

## 2016-12-14 NOTE — Telephone Encounter (Signed)
Can you help and look in to

## 2016-12-18 ENCOUNTER — Other Ambulatory Visit: Payer: 59

## 2016-12-19 ENCOUNTER — Ambulatory Visit
Admission: RE | Admit: 2016-12-19 | Discharge: 2016-12-19 | Disposition: A | Discharge status: Home / Self Care | Payer: 59 | Source: Ambulatory Visit | Attending: Obstetrics & Gynecology | Admitting: Obstetrics & Gynecology

## 2016-12-19 DIAGNOSIS — N6310 Unspecified lump in the right breast, unspecified quadrant: Secondary | ICD-10-CM

## 2017-02-13 NOTE — Progress Notes (Signed)
Subjective:    Patient ID: Marisa Contreras, female    DOB: September 23, 1975, 42 y.o.   MRN: [id number]  HPI She is here for an acute visit for cold symptoms.  Her symptoms started one week ago.    She is experiencing chills, hoarseness, nasal congestion,left jaw and teeth pain, sinus pain,  fever over 100, achy.   She has had headaches, lightheadedness and a very mild cough.  She is concerned about possibly having a sinus infection.  She has taken no medications for her symptoms.  She leaves for Vacation in 1 week.  She will be flying and scuba diving.   Medications and allergies reviewed with patient and updated if appropriate.  Patient Active Problem List   Diagnosis Date Noted  . Concussion syndrome 10/09/2016  . Syncope 10/09/2016  . Abnormal EKG 11/16/2015  . Rib fracture 08/14/2015  . Chest pain 08/02/2015  . Pleurisy 04/28/2015  . Muscle spasms of neck 04/27/2014  . Hypothyroidism 01/05/2013  . HTN (hypertension), benign 11/13/2012  . Elevated liver function tests 04/16/2011  . History of chicken pox 09/08/2010    Current Outpatient Medications on File Prior to Visit  Medication Sig Dispense Refill  . levothyroxine (SYNTHROID, LEVOTHROID) 88 MCG tablet Take 1 tablet (88 mcg total) by mouth daily before breakfast. 90 tablet 3  . losartan (COZAAR) 25 MG tablet Take 1 tablet (25 mg total) by mouth daily. 30 tablet 5   No current facility-administered medications on file prior to visit.     Past Medical History:  Diagnosis Date  . Acute bronchitis 04/28/2015  . Chest pain at rest 08/02/2015  . Chicken pox 09/08/2010  . Elevated liver function tests 04/16/2011  . Headache(784.0) 12/11/2010  . History of chicken pox 09/08/2010  . Hypertension   . Hypothyroidism   . Neck pain 01/20/2012  . Neck pain, acute 12/11/2010  . Pleurisy 04/28/2015  . Preventative health care 12/11/2010   Had Tetanus in 2007 per patient   . Second degree burn of arm 09/12/2010  . Shoulder pain,  right 12/11/2010  . Sinusitis 01/07/2012  . Thyroid disease   . Vaginal Pap smear, abnormal   . Visit for gynecologic examination 12/11/2010    Past Surgical History:  Procedure Laterality Date  . GYNECOLOGIC CRYOSURGERY    . TRUNK SKIN LESION EXCISIONAL BIOPSY     abd, benign    Social History   Socioeconomic History  . Marital status: Married    Spouse name: None  . Number of children: None  . Years of education: None  . Highest education level: None  Social Needs  . Financial resource strain: None  . Food insecurity - worry: None  . Food insecurity - inability: None  . Transportation needs - medical: None  . Transportation needs - non-medical: None  Occupational History  . None  Tobacco Use  . Smoking status: Former Smoker    Types: Cigarettes    Last attempt to quit: 01/01/2006    Years since quitting: 11.1  . Smokeless tobacco: Never Used  Substance and Sexual Activity  . Alcohol use: No    Comment: 2 bottle of wine a week  . Drug use: No  . Sexual activity: Yes    Partners: Male  Other Topics Concern  . None  Social History Narrative  . None    Family History  Problem Relation Age of Onset  . Hypertension Father   . Diabetes Father   . Thyroid disease Maternal Grandmother   .  Hyperlipidemia Maternal Grandmother   . Heart disease Maternal Grandfather   . Alcohol abuse Paternal Grandmother   . Heart attack Paternal Grandfather   . Heart disease Paternal Grandfather     Review of Systems  Constitutional: Positive for chills and fever.  HENT: Positive for congestion, ear pain (left ), postnasal drip and sinus pain. Negative for sore throat.   Respiratory: Positive for cough (from PND). Negative for shortness of breath and wheezing.   Gastrointestinal: Negative for diarrhea and nausea.  Neurological: Positive for light-headedness and headaches.       Objective:   Vitals:   02/14/17 1109  BP: 120/74  Pulse: 69  Resp: 16  Temp: 99.2 F (37.3 C)    SpO2: 99%   Filed Weights   02/14/17 1109  Weight: 127 lb (57.6 kg)   Body mass index is 19.89 kg/m.  Wt Readings from Last 3 Encounters:  02/14/17 127 lb (57.6 kg)  10/09/16 127 lb (57.6 kg)  11/16/15 129 lb 9.6 oz (58.8 kg)     Physical Exam GENERAL APPEARANCE: Appears stated age, well appearing, NAD EYES: conjunctiva clear, no icterus HEENT: bilateral tympanic membranes and ear canals normal, oropharynx with mild erythema, left-sided maxillary sinus tenderness, no thyromegaly, trachea midline, no cervical or supraclavicular lymphadenopathy LUNGS: Clear to auscultation without wheeze or crackles, unlabored breathing, good air entry bilaterally CARDIOVASCULAR: Normal S1,S2 without murmurs, no edema SKIN: warm, dry        Assessment & Plan:   See Problem List for Assessment and Plan of chronic medical problems.

## 2017-02-14 ENCOUNTER — Ambulatory Visit (INDEPENDENT_AMBULATORY_CARE_PROVIDER_SITE_OTHER): Payer: 59 | Admitting: Internal Medicine

## 2017-02-14 ENCOUNTER — Encounter: Payer: Self-pay | Admitting: Internal Medicine

## 2017-02-14 VITALS — BP 120/74 | HR 69 | Temp 99.2°F | Resp 16 | Wt 127.0 lb

## 2017-02-14 DIAGNOSIS — J0100 Acute maxillary sinusitis, unspecified: Secondary | ICD-10-CM | POA: Diagnosis not present

## 2017-02-14 MED ORDER — AMOXICILLIN-POT CLAVULANATE 875-125 MG PO TABS: 1.0000 | ORAL_TABLET | Freq: Two times a day (BID) | ORAL | 0 refills | Status: DC

## 2017-02-14 NOTE — Assessment & Plan Note (Signed)
Likely bacterial in nature Start Augmentin twice daily times 10 days Advised Flonase and or Sudafed for left ear symptoms-exam is normal and her pain is likely from eustachian tube dysfunction Advised to continue prior to her flight and diving Call if no improvement over the next 3 days-we will consider steroids given her upcoming flying and diving

## 2017-02-14 NOTE — Patient Instructions (Signed)
Take the antibiotic as prescribed.  Use flonase daily for the next week.    Call if no improvement   Sinusitis, Adult Sinusitis is soreness and inflammation of your sinuses. Sinuses are hollow spaces in the bones around your face. Your sinuses are located:  Around your eyes.  In the middle of your forehead.  Behind your nose.  In your cheekbones.  Your sinuses and nasal passages are lined with a stringy fluid (mucus). Mucus normally drains out of your sinuses. When your nasal tissues become inflamed or swollen, the mucus can become trapped or blocked so air cannot flow through your sinuses. This allows bacteria, viruses, and funguses to grow, which leads to infection. Sinusitis can develop quickly and last for 7?10 days (acute) or for more than 12 weeks (chronic). Sinusitis often develops after a cold. What are the causes? This condition is caused by anything that creates swelling in the sinuses or stops mucus from draining, including:  Allergies.  Asthma.  Bacterial or viral infection.  Abnormally shaped bones between the nasal passages.  Nasal growths that contain mucus (nasal polyps).  Narrow sinus openings.  Pollutants, such as chemicals or irritants in the air.  A foreign object stuck in the nose.  A fungal infection. This is rare.  What increases the risk? The following factors may make you more likely to develop this condition:  Having allergies or asthma.  Having had a recent cold or respiratory tract infection.  Having structural deformities or blockages in your nose or sinuses.  Having a weak immune system.  Doing a lot of swimming or diving.  Overusing nasal sprays.  Smoking.  What are the signs or symptoms? The main symptoms of this condition are pain and a feeling of pressure around the affected sinuses. Other symptoms include:  Upper toothache.  Earache.  Headache.  Bad breath.  Decreased sense of smell and taste.  A cough that may  get worse at night.  Fatigue.  Fever.  Thick drainage from your nose. The drainage is often green and it may contain pus (purulent).  Stuffy nose or congestion.  Postnasal drip. This is when extra mucus collects in the throat or back of the nose.  Swelling and warmth over the affected sinuses.  Sore throat.  Sensitivity to light.  How is this diagnosed? This condition is diagnosed based on symptoms, a medical history, and a physical exam. To find out if your condition is acute or chronic, your health care provider may:  Look in your nose for signs of nasal polyps.  Tap over the affected sinus to check for signs of infection.  View the inside of your sinuses using an imaging device that has a light attached (endoscope).  If your health care provider suspects that you have chronic sinusitis, you may also:  Be tested for allergies.  Have a sample of mucus taken from your nose (nasal culture) and checked for bacteria.  Have a mucus sample examined to see if your sinusitis is related to an allergy.  If your sinusitis does not respond to treatment and it lasts longer than 8 weeks, you may have an MRI or CT scan to check your sinuses. These scans also help to determine how severe your infection is. In rare cases, a bone biopsy may be done to rule out more serious types of fungal sinus disease. How is this treated? Treatment for sinusitis depends on the cause and whether your condition is chronic or acute. If a virus is causing  your sinusitis, your symptoms will go away on their own within 10 days. You may be given medicines to relieve your symptoms, including:  Topical nasal decongestants. They shrink swollen nasal passages and let mucus drain from your sinuses.  Antihistamines. These drugs block inflammation that is triggered by allergies. This can help to ease swelling in your nose and sinuses.  Topical nasal corticosteroids. These are nasal sprays that ease inflammation and  swelling in your nose and sinuses.  Nasal saline washes. These rinses can help to get rid of thick mucus in your nose.  If your condition is caused by bacteria, you will be given an antibiotic medicine. If your condition is caused by a fungus, you will be given an antifungal medicine. Surgery may be needed to correct underlying conditions, such as narrow nasal passages. Surgery may also be needed to remove polyps. Follow these instructions at home: Medicines  Take, use, or apply over-the-counter and prescription medicines only as told by your health care provider. These may include nasal sprays.  If you were prescribed an antibiotic medicine, take it as told by your health care provider. Do not stop taking the antibiotic even if you start to feel better. Hydrate and Humidify  Drink enough water to keep your urine clear or pale yellow. Staying hydrated will help to thin your mucus.  Use a cool mist humidifier to keep the humidity level in your home above 50%.  Inhale steam for 10-15 minutes, 3-4 times a day or as told by your health care provider. You can do this in the bathroom while a hot shower is running.  Limit your exposure to cool or dry air. Rest  Rest as much as possible.  Sleep with your head raised (elevated).  Make sure to get enough sleep each night. General instructions  Apply a warm, moist washcloth to your face 3-4 times a day or as told by your health care provider. This will help with discomfort.  Wash your hands often with soap and water to reduce your exposure to viruses and other germs. If soap and water are not available, use hand sanitizer.  Do not smoke. Avoid being around people who are smoking (secondhand smoke).  Keep all follow-up visits as told by your health care provider. This is important. Contact a health care provider if:  You have a fever.  Your symptoms get worse.  Your symptoms do not improve within 10 days. Get help right away  if:  You have a severe headache.  You have persistent vomiting.  You have pain or swelling around your face or eyes.  You have vision problems.  You develop confusion.  Your neck is stiff.  You have trouble breathing. This information is not intended to replace advice given to you by your health care provider. Make sure you discuss any questions you have with your health care provider. Document Released: 12/18/2004 Document Revised: 08/14/2015 Document Reviewed: 10/13/2014 Elsevier Interactive Patient Education  [employer]2018 [employer]

## 2017-03-15 ENCOUNTER — Telehealth: Payer: Self-pay | Admitting: Internal Medicine

## 2017-03-15 NOTE — Telephone Encounter (Signed)
Copied from CRM [id number]. Topic: Quick Communication - See Telephone Encounter >> Mar 15, 2017  4:59 PM Landry MellowFoltz, Melissa J wrote: CRM for notification. See Telephone encounter for:   03/15/17. Pt's child was diagnised with flu a. Pt states that she is getting symptoms also.  She is asking for tamiflu.  She refused to make an appt for Sat clinic. If approved, [facility] [address].

## 2017-03-18 NOTE — Telephone Encounter (Signed)
Spoke with pt, no dose not currently have any symptoms.

## 2017-03-18 NOTE — Telephone Encounter (Signed)
Call - what symptoms does she have -- she called Friday which is three days ago - does she still want Tamiflu?

## 2017-03-25 ENCOUNTER — Other Ambulatory Visit: Payer: Self-pay | Admitting: Emergency Medicine

## 2017-03-25 MED ORDER — LOSARTAN POTASSIUM 25 MG PO TABS: 25.0000 mg | ORAL_TABLET | Freq: Every day | ORAL | 1 refills | Status: DC

## 2017-10-07 ENCOUNTER — Other Ambulatory Visit: Payer: Self-pay | Admitting: Internal Medicine

## 2017-11-11 ENCOUNTER — Other Ambulatory Visit: Payer: Self-pay | Admitting: Internal Medicine

## 2017-11-26 ENCOUNTER — Other Ambulatory Visit: Payer: Self-pay | Admitting: Internal Medicine

## 2017-11-26 MED ORDER — LOSARTAN POTASSIUM 25 MG PO TABS: 25.0000 mg | ORAL_TABLET | Freq: Every day | ORAL | 0 refills | Status: DC

## 2017-11-26 NOTE — Telephone Encounter (Signed)
Copied from CRM [id number]. Topic: Quick Communication - Rx Refill/Question >> Nov 26, 2017 11:10 AM Jaquita Rectoravis, Karen A wrote: Medication: losartan (COZAAR) 25 MG tablet   Has the patient contacted their pharmacy? Yes.   (Agent: If no, request that the patient contact the pharmacy for the refill.) (Agent: If yes, when and what did the pharmacy advise?)  Preferred Pharmacy (with phone number or street name): [id number] [place] [place] - [address] [phone] (Phone) [phone] (Fax)    Agent: Please be advised that RX refills may take up to 3 business days. We ask that you follow-up with your pharmacy.

## 2017-11-26 NOTE — Telephone Encounter (Signed)
Courtesy refill Appointment  Scheduled 01/10/2018

## 2017-11-26 NOTE — Telephone Encounter (Signed)
Called patient and scheduled physical. Last physical 10/09/2016.

## 2018-01-10 ENCOUNTER — Encounter: Payer: 59 | Admitting: Internal Medicine

## 2018-02-05 ENCOUNTER — Telehealth: Payer: Self-pay | Admitting: Internal Medicine

## 2018-02-05 NOTE — Telephone Encounter (Signed)
Ok with me 

## 2018-02-05 NOTE — Telephone Encounter (Signed)
Copied from CRM [id number]. Topic: General - Other >> Feb 05, 2018  3:51 PM Marisa Contreras wrote: Reason for CRM: Patient called to request to become a patient again with Dr. Abner Greenspan at [facility] because of the location.  Please advise.  Patient's contact number is [phone]   Dr.Burns is that okay with you?

## 2018-02-06 NOTE — Telephone Encounter (Signed)
Routing to [facility] office.

## 2018-02-06 NOTE — Telephone Encounter (Signed)
OK with me.

## 2018-02-11 NOTE — Telephone Encounter (Signed)
Please schedule NP appt w/ Dr. Abner Greenspan- please inform Pt it may take several months.

## 2018-02-12 NOTE — Telephone Encounter (Signed)
LVM for pt to call office and schedule new pt appt with Dr Abner Greenspan (approved by provider). Ok to schedule.

## 2018-02-23 ENCOUNTER — Other Ambulatory Visit: Payer: Self-pay | Admitting: Internal Medicine

## 2018-03-03 NOTE — Telephone Encounter (Signed)
Due to the provider patient panel and administrative commitments, Dr. Abner Greenspan can no longer accept new patients.

## 2018-06-30 ENCOUNTER — Telehealth: Payer: Self-pay

## 2018-06-30 NOTE — Telephone Encounter (Signed)
Copied from [place] [id number]. Topic: Appointment Scheduling - Transfer of Care >> Jun 27, 2018  3:22 PM Jeri Cos wrote: Pt is requesting to transfer FROM: [facility] Pt is requesting to transfer TO: Charlett Blake Reason for requested transfer: Personality conflicts   Send CRM to patient's current PCP (transferring FROM).  Pt stated that she initiated this transfer back in January but Dr. Charlett Blake was not accepting new patients at that time. Pt stated that this was already approved by Dr. Charlett Blake. Please contact pt. >> Jun 27, 2018  3:26 PM Para Skeans A wrote: Dr.Burns okay with this?

## 2018-06-30 NOTE — Telephone Encounter (Signed)
Sending to [facility] to make [facility] appointment.

## 2018-06-30 NOTE — Telephone Encounter (Signed)
Ok with me 

## 2018-07-02 NOTE — Telephone Encounter (Signed)
Please schedule patient appt.

## 2018-07-07 NOTE — Telephone Encounter (Signed)
LVM to schedule TOC appointment with Dr. Charlett Blake. Ok per provider.

## 2018-07-25 ENCOUNTER — Other Ambulatory Visit: Payer: Self-pay

## 2018-07-25 ENCOUNTER — Encounter: Payer: Self-pay | Admitting: Family Medicine

## 2018-07-25 ENCOUNTER — Ambulatory Visit (INDEPENDENT_AMBULATORY_CARE_PROVIDER_SITE_OTHER): Payer: 59 | Admitting: Family Medicine

## 2018-07-25 VITALS — BP 98/82 | HR 64 | Temp 98.2°F | Resp 18 | Wt 127.0 lb

## 2018-07-25 DIAGNOSIS — R945 Abnormal results of liver function studies: Secondary | ICD-10-CM

## 2018-07-25 DIAGNOSIS — I10 Essential (primary) hypertension: Secondary | ICD-10-CM

## 2018-07-25 DIAGNOSIS — R0600 Dyspnea, unspecified: Secondary | ICD-10-CM

## 2018-07-25 DIAGNOSIS — R7989 Other specified abnormal findings of blood chemistry: Secondary | ICD-10-CM

## 2018-07-25 DIAGNOSIS — H5711 Ocular pain, right eye: Secondary | ICD-10-CM | POA: Diagnosis not present

## 2018-07-25 DIAGNOSIS — E039 Hypothyroidism, unspecified: Secondary | ICD-10-CM | POA: Diagnosis not present

## 2018-07-25 DIAGNOSIS — R55 Syncope and collapse: Secondary | ICD-10-CM

## 2018-07-25 DIAGNOSIS — R109 Unspecified abdominal pain: Secondary | ICD-10-CM

## 2018-07-25 DIAGNOSIS — R079 Chest pain, unspecified: Secondary | ICD-10-CM

## 2018-07-25 LAB — COMPREHENSIVE METABOLIC PANEL
ALT: 130 U/L — ABNORMAL HIGH (ref 0–35)
AST: 161 U/L — ABNORMAL HIGH (ref 0–37)
Albumin: 4.8 g/dL (ref 3.5–5.2)
Alkaline Phosphatase: 90 U/L (ref 39–117)
BUN: 10 mg/dL (ref 6–23)
CO2: 27 mEq/L (ref 19–32)
Calcium: 9.5 mg/dL (ref 8.4–10.5)
Chloride: 95 mEq/L — ABNORMAL LOW (ref 96–112)
Creatinine, Ser: 0.84 mg/dL (ref 0.40–1.20)
GFR: 74.48 mL/min (ref >60.00)
Glucose, Bld: 64 mg/dL — ABNORMAL LOW (ref 70–99)
Potassium: 4.6 mEq/L (ref 3.5–5.1)
Sodium: 133 mEq/L — ABNORMAL LOW (ref 135–145)
Total Bilirubin: 0.7 mg/dL (ref 0.2–1.2)
Total Protein: 7.5 g/dL (ref 6.0–8.3)

## 2018-07-25 MED ORDER — LEVOTHYROXINE SODIUM 88 MCG PO TABS: 88.0000 ug | ORAL_TABLET | Freq: Every day | ORAL | 3 refills | Status: DC

## 2018-07-25 MED ORDER — LOSARTAN POTASSIUM 25 MG PO TABS: 25.0000 mg | ORAL_TABLET | Freq: Every day | ORAL | 3 refills | Status: DC

## 2018-07-25 NOTE — Assessment & Plan Note (Signed)
Well controlled, no changes to meds. Encouraged heart healthy diet such as the DASH diet and exercise as tolerated.  °

## 2018-07-25 NOTE — Patient Instructions (Signed)

## 2018-07-25 NOTE — Assessment & Plan Note (Signed)
S/p a trauma 2 years

## 2018-07-25 NOTE — Assessment & Plan Note (Signed)
She has recently been told by her GYN Dr Lynnette Caffey that her liver markers are up further. She has cut out all Tylenol and carbs. They have checked Acute hep panel and it is negative. Will proceed with abdominal ultrasound due to this and some occasional abdominal discomfort. recpeat cmp today. Request records from Dr Lynnette Caffey

## 2018-07-25 NOTE — Assessment & Plan Note (Signed)
On Levothyroxine, continue to monitor 

## 2018-07-27 NOTE — Assessment & Plan Note (Signed)
She has a history of syncope infrequent for years, as does her mother. She had a syncopal episode recently and she fell and struck her face on her laundry hamper. She is doing better but notes mildly tender over eye since then which was a couple months ago. No visual changes. She will report worsening symptoms.

## 2018-07-27 NOTE — Assessment & Plan Note (Signed)
Has not had any obvious chest pain but does note some pain in left arm that radiates to left jaw at times. Notes some mild dyspnea on exertion as well will proceed with echo for now and if symptoms worsen consider referral to cardiology for further considertion
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
## 2018-07-30 NOTE — Addendum Note (Signed)
Addended by: Magdalene Molly A on: 07/30/2018 10:16 AM   Modules accepted: Orders

## 2018-08-04 ENCOUNTER — Other Ambulatory Visit (HOSPITAL_BASED_OUTPATIENT_CLINIC_OR_DEPARTMENT_OTHER): Payer: 59

## 2018-08-13 ENCOUNTER — Ambulatory Visit (HOSPITAL_COMMUNITY)
Admission: RE | Admit: 2018-08-13 | Discharge: 2018-08-13 | Disposition: A | Discharge status: Home / Self Care | Payer: 59 | Source: Ambulatory Visit | Attending: Family Medicine | Admitting: Family Medicine

## 2018-08-13 ENCOUNTER — Other Ambulatory Visit: Payer: 59

## 2018-08-13 ENCOUNTER — Other Ambulatory Visit: Payer: Self-pay

## 2018-08-13 ENCOUNTER — Other Ambulatory Visit (HOSPITAL_BASED_OUTPATIENT_CLINIC_OR_DEPARTMENT_OTHER): Payer: 59

## 2018-08-13 DIAGNOSIS — R0600 Dyspnea, unspecified: Secondary | ICD-10-CM | POA: Insufficient documentation

## 2018-08-13 DIAGNOSIS — Z87891 Personal history of nicotine dependence: Secondary | ICD-10-CM | POA: Diagnosis not present

## 2018-08-13 DIAGNOSIS — I10 Essential (primary) hypertension: Secondary | ICD-10-CM | POA: Diagnosis not present

## 2018-08-13 DIAGNOSIS — E079 Disorder of thyroid, unspecified: Secondary | ICD-10-CM | POA: Diagnosis not present

## 2018-08-13 NOTE — Progress Notes (Signed)
  Echocardiogram 2D Echocardiogram has been performed.  Marisa Contreras G [name] 08/13/2018, 10:28 AM

## 2018-08-20 ENCOUNTER — Ambulatory Visit (HOSPITAL_BASED_OUTPATIENT_CLINIC_OR_DEPARTMENT_OTHER)
Admission: RE | Admit: 2018-08-20 | Discharge: 2018-08-20 | Disposition: A | Discharge status: Home / Self Care | Payer: 59 | Source: Ambulatory Visit | Attending: Family Medicine | Admitting: Family Medicine

## 2018-08-20 ENCOUNTER — Other Ambulatory Visit: Payer: Self-pay

## 2018-08-20 ENCOUNTER — Other Ambulatory Visit: Payer: Self-pay | Admitting: *Deleted

## 2018-08-20 ENCOUNTER — Other Ambulatory Visit (INDEPENDENT_AMBULATORY_CARE_PROVIDER_SITE_OTHER): Payer: 59

## 2018-08-20 ENCOUNTER — Other Ambulatory Visit (HOSPITAL_BASED_OUTPATIENT_CLINIC_OR_DEPARTMENT_OTHER): Payer: 59

## 2018-08-20 ENCOUNTER — Other Ambulatory Visit: Payer: 59

## 2018-08-20 DIAGNOSIS — R109 Unspecified abdominal pain: Secondary | ICD-10-CM

## 2018-08-20 DIAGNOSIS — R55 Syncope and collapse: Secondary | ICD-10-CM

## 2018-08-20 DIAGNOSIS — I10 Essential (primary) hypertension: Secondary | ICD-10-CM

## 2018-08-20 DIAGNOSIS — R945 Abnormal results of liver function studies: Secondary | ICD-10-CM | POA: Diagnosis not present

## 2018-08-20 DIAGNOSIS — R079 Chest pain, unspecified: Secondary | ICD-10-CM | POA: Diagnosis not present

## 2018-08-20 DIAGNOSIS — R7989 Other specified abnormal findings of blood chemistry: Secondary | ICD-10-CM

## 2018-08-20 DIAGNOSIS — Z20828 Contact with and (suspected) exposure to other viral communicable diseases: Secondary | ICD-10-CM

## 2018-08-20 DIAGNOSIS — Z20822 Contact with and (suspected) exposure to covid-19: Secondary | ICD-10-CM

## 2018-08-20 LAB — COMPREHENSIVE METABOLIC PANEL
ALT: 115 U/L — ABNORMAL HIGH (ref 0–35)
AST: 108 U/L — ABNORMAL HIGH (ref 0–37)
Albumin: 5.1 g/dL (ref 3.5–5.2)
Alkaline Phosphatase: 91 U/L (ref 39–117)
BUN: 11 mg/dL (ref 6–23)
CO2: 26 mEq/L (ref 19–32)
Calcium: 9.7 mg/dL (ref 8.4–10.5)
Chloride: 92 mEq/L — ABNORMAL LOW (ref 96–112)
Creatinine, Ser: 0.9 mg/dL (ref 0.40–1.20)
GFR: 68.76 mL/min (ref >60.00)
Glucose, Bld: 76 mg/dL (ref 70–99)
Potassium: 4 mEq/L (ref 3.5–5.1)
Sodium: 130 mEq/L — ABNORMAL LOW (ref 135–145)
Total Bilirubin: 0.6 mg/dL (ref 0.2–1.2)
Total Protein: 8.1 g/dL (ref 6.0–8.3)

## 2018-08-22 ENCOUNTER — Encounter: Payer: Self-pay | Admitting: Family Medicine

## 2018-08-22 ENCOUNTER — Other Ambulatory Visit: Payer: Self-pay

## 2018-08-22 ENCOUNTER — Other Ambulatory Visit: Payer: Self-pay | Admitting: *Deleted

## 2018-08-22 DIAGNOSIS — E871 Hypo-osmolality and hyponatremia: Secondary | ICD-10-CM

## 2018-08-22 DIAGNOSIS — R55 Syncope and collapse: Secondary | ICD-10-CM

## 2018-08-22 DIAGNOSIS — R7989 Other specified abnormal findings of blood chemistry: Secondary | ICD-10-CM

## 2018-08-22 DIAGNOSIS — I10 Essential (primary) hypertension: Secondary | ICD-10-CM

## 2018-08-22 DIAGNOSIS — E039 Hypothyroidism, unspecified: Secondary | ICD-10-CM

## 2018-08-22 LAB — EPSTEIN-BARR VIRUS NUCLEAR ANTIGEN ANTIBODY, IGG: EBV NA IgG: 241 U/mL — ABNORMAL HIGH

## 2018-08-22 LAB — SAR COV2 SEROLOGY (COVID19)AB(IGG),IA: SARS CoV2 AB IGG: NEGATIVE

## 2018-08-22 LAB — CMV IGM: CMV IgM: <30 AU/mL

## 2018-08-22 NOTE — Addendum Note (Signed)
Addended by: Kem Boroughs D on: 08/22/2018 02:20 PM   Modules accepted: Orders

## 2018-08-26 NOTE — Telephone Encounter (Signed)
Lab orders placed.  

## 2018-08-28 ENCOUNTER — Encounter: Payer: Self-pay | Admitting: Family Medicine

## 2018-09-15 ENCOUNTER — Telehealth: Payer: Self-pay | Admitting: Family Medicine
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
## 2018-09-17 ENCOUNTER — Encounter: Payer: Self-pay | Admitting: Family Medicine

## 2018-09-17 ENCOUNTER — Ambulatory Visit (HOSPITAL_BASED_OUTPATIENT_CLINIC_OR_DEPARTMENT_OTHER)
Admission: RE | Admit: 2018-09-17 | Discharge: 2018-09-17 | Disposition: A | Discharge status: Home / Self Care | Payer: 59 | Source: Ambulatory Visit | Attending: Family Medicine | Admitting: Family Medicine

## 2018-09-17 ENCOUNTER — Other Ambulatory Visit (INDEPENDENT_AMBULATORY_CARE_PROVIDER_SITE_OTHER): Payer: 59

## 2018-09-17 ENCOUNTER — Ambulatory Visit (INDEPENDENT_AMBULATORY_CARE_PROVIDER_SITE_OTHER): Payer: 59 | Admitting: Family Medicine

## 2018-09-17 ENCOUNTER — Other Ambulatory Visit: Payer: Self-pay

## 2018-09-17 VITALS — BP 122/72 | HR 65 | Temp 97.4°F | Resp 16 | Ht 67.0 in | Wt 127.0 lb

## 2018-09-17 DIAGNOSIS — M25572 Pain in left ankle and joints of left foot: Secondary | ICD-10-CM

## 2018-09-17 DIAGNOSIS — E871 Hypo-osmolality and hyponatremia: Secondary | ICD-10-CM | POA: Diagnosis not present

## 2018-09-17 DIAGNOSIS — I10 Essential (primary) hypertension: Secondary | ICD-10-CM

## 2018-09-17 DIAGNOSIS — R55 Syncope and collapse: Secondary | ICD-10-CM | POA: Diagnosis not present

## 2018-09-17 DIAGNOSIS — R945 Abnormal results of liver function studies: Secondary | ICD-10-CM

## 2018-09-17 DIAGNOSIS — E039 Hypothyroidism, unspecified: Secondary | ICD-10-CM

## 2018-09-17 DIAGNOSIS — Z23 Encounter for immunization: Secondary | ICD-10-CM

## 2018-09-17 DIAGNOSIS — R7989 Other specified abnormal findings of blood chemistry: Secondary | ICD-10-CM

## 2018-09-17 LAB — T4, FREE: Free T4: 1.08 ng/dL (ref 0.60–1.60)

## 2018-09-17 LAB — COMPREHENSIVE METABOLIC PANEL
ALT: 101 U/L — ABNORMAL HIGH (ref 0–35)
AST: 133 U/L — ABNORMAL HIGH (ref 0–37)
Albumin: 4.4 g/dL (ref 3.5–5.2)
Alkaline Phosphatase: 74 U/L (ref 39–117)
BUN: 9 mg/dL (ref 6–23)
CO2: 28 mEq/L (ref 19–32)
Calcium: 9.5 mg/dL (ref 8.4–10.5)
Chloride: 99 mEq/L (ref 96–112)
Creatinine, Ser: 0.77 mg/dL (ref 0.40–1.20)
GFR: 82.29 mL/min (ref >60.00)
Glucose, Bld: 77 mg/dL (ref 70–99)
Potassium: 4.1 mEq/L (ref 3.5–5.1)
Sodium: 136 mEq/L (ref 135–145)
Total Bilirubin: 0.7 mg/dL (ref 0.2–1.2)
Total Protein: 7.3 g/dL (ref 6.0–8.3)

## 2018-09-17 LAB — GAMMA GT: GGT: 151 U/L — ABNORMAL HIGH (ref 7–51)

## 2018-09-17 LAB — TSH: TSH: 1.15 u[IU]/mL (ref 0.35–4.50)

## 2018-09-17 NOTE — Patient Instructions (Signed)
Good to see you today- I will be in touch with your x-ray report asap Continue to ice and elevate your ankle when you can.  I expect it to gradually improve, but let me know if this is not the case I think exercise is ok, but stop when you begin to have ankle pain   You can have your blood drawn today as well  Take care!

## 2018-09-18 ENCOUNTER — Other Ambulatory Visit: Payer: Self-pay | Admitting: *Deleted

## 2018-09-18 ENCOUNTER — Other Ambulatory Visit: Payer: 59

## 2018-09-18 DIAGNOSIS — R7989 Other specified abnormal findings of blood chemistry: Secondary | ICD-10-CM

## 2018-09-19 LAB — HEPATITIS PANEL, ACUTE
Hep A IgM: NONREACTIVE
Hep B C IgM: NONREACTIVE
Hepatitis B Surface Ag: NONREACTIVE
Hepatitis C Ab: NONREACTIVE
SIGNAL TO CUT-OFF: 0.02 (ref <1.00)

## 2018-09-24 ENCOUNTER — Ambulatory Visit: Payer: 59

## 2018-09-24 ENCOUNTER — Other Ambulatory Visit: Payer: 59

## 2018-09-24 ENCOUNTER — Encounter: Payer: Self-pay | Admitting: Family Medicine

## 2018-09-26 ENCOUNTER — Encounter: Payer: Self-pay | Admitting: Family Medicine

## 2018-09-26 DIAGNOSIS — R7989 Other specified abnormal findings of blood chemistry: Secondary | ICD-10-CM

## 2018-10-16 ENCOUNTER — Other Ambulatory Visit: Payer: 59

## 2018-10-22 ENCOUNTER — Encounter: Payer: Self-pay | Admitting: Family Medicine

## 2018-10-29 ENCOUNTER — Other Ambulatory Visit: Payer: 59

## 2018-10-31 ENCOUNTER — Other Ambulatory Visit: Payer: 59

## 2018-11-03 ENCOUNTER — Ambulatory Visit: Payer: Self-pay | Admitting: Surgery

## 2018-11-03 NOTE — H&P (Signed)
Marisa ClevelandJennifer S Sedivy Documented: 11/03/2018 3:21 PM Location: [facility] Patient #: [id number] DOB: [birth year] Married / Language: [place] / Race: White Female  History of Present Illness Ardeth Sportsman(Nareg Breighner C. Luddie Boghosian MD; 11/03/2018 5:40 PM) The patient is a [age] year old female who presents with hemorrhoids. Note for "Hemorrhoids": ` ` ` Patient sent for surgical consultation at the request of Mitchel HonourMegan Morris, DO  Chief Complaint: Persistent hemorrhoids ` ` The patient is a healthy active woman that had hemorrhoids with her last pregnancy 5 years ago. She moves her bowels once or twice a day. However she notes the hemorrhoids have gradually gotten worse. Persistent problem. Now on his out all the time. Can be irritating and frustrating. Hard to keep the area clean. She wish to consider something more aggressive done to help fix it. Surgical consultation offered. She does not smoke. She is not diabetic. She is otherwise active. No prior anorectal interventions. No history of any abscess or fistula or thrombosed hemorrhoid.  No personal nor family history of GI/colon cancer, inflammatory bowel disease, irritable bowel syndrome, allergy such as Celiac Sprue, dietary/dairy problems, colitis, ulcers nor gastritis. No recent sick contacts/gastroenteritis. No travel outside the country. No changes in diet. No dysphagia to solids or liquids. No significant heartburn or reflux. No hematochezia, hematemesis, coffee ground emesis. No evidence of prior gastric/peptic ulceration.  (Review of systems as stated in this history (HPI) or in the review of systems. Otherwise all other 12 point ROS are negative) ` ` `   Past Surgical History Renee Ramus(Armen Ferguson, CMA; 11/03/2018 3:22 PM) No pertinent past surgical history  Diagnostic Studies History Renee Ramus(Armen Ferguson, CMA; 11/03/2018 3:22 PM) Colonoscopy never Mammogram 1-3 years ago Pap Smear 1-5 years ago  Allergies Renee Ramus(Armen Ferguson, CMA;  11/03/2018 3:22 PM) No Known Drug Allergies [11/03/2018]:  Medication History (Armen Ferguson, CMA; 11/03/2018 3:23 PM) Losartan Potassium (25MG  Tablet, Oral) Active. Levothyroxine Sodium (88MCG Tablet, Oral) Active. Medications Reconciled  Social History Renee Ramus(Armen Ferguson, CMA; 11/03/2018 3:22 PM) Alcohol use Moderate alcohol use. Caffeine use Tea. No drug use Tobacco use Never smoker.  Family History Renee Ramus(Armen Ferguson, CMA; 11/03/2018 3:22 PM) Kidney Disease Father.  Pregnancy / Birth History Renee Ramus(Armen Ferguson, CMA; 11/03/2018 3:22 PM) Age at menarche 14 years. Gravida 1 Maternal age 536-40 Para 1 Regular periods  Other Problems Renee Ramus(Armen Ferguson, CMA; 11/03/2018 3:22 PM) High blood pressure Thyroid Disease     Review of Systems (Armen Ferguson CMA; 11/03/2018 3:22 PM) General Not Present- Appetite Loss, Chills, Fatigue, Fever, Night Sweats, Weight Gain and Weight Loss. Skin Not Present- Change in Wart/Mole, Dryness, Hives, Jaundice, New Lesions, Non-Healing Wounds, Rash and Ulcer. HEENT Present- Wears glasses/contact lenses. Not Present- Earache, Hearing Loss, Hoarseness, Nose Bleed, Oral Ulcers, Ringing in the Ears, Seasonal Allergies, Sinus Pain, Sore Throat, Visual Disturbances and Yellow Eyes. Respiratory Not Present- Bloody sputum, Chronic Cough, Difficulty Breathing, Snoring and Wheezing. Breast Not Present- Breast Mass, Breast Pain, Nipple Discharge and Skin Changes. Cardiovascular Not Present- Chest Pain, Difficulty Breathing Lying Down, Leg Cramps, Palpitations, Rapid Heart Rate, Shortness of Breath and Swelling of Extremities. Gastrointestinal Present- Hemorrhoids. Not Present- Abdominal Pain, Bloating, Bloody Stool, Change in Bowel Habits, Chronic diarrhea, Constipation, Difficulty Swallowing, Excessive gas, Gets full quickly at meals, Indigestion, Nausea, Rectal Pain and Vomiting. Female Genitourinary Not Present- Frequency, Nocturia, Painful Urination, Pelvic  Pain and Urgency. Musculoskeletal Not Present- Back Pain, Joint Pain, Joint Stiffness, Muscle Pain, Muscle Weakness and Swelling of Extremities. Neurological Not Present- Decreased Memory, Fainting, Headaches, Numbness, Seizures, Tingling, Tremor,  Trouble walking and Weakness. Psychiatric Not Present- Anxiety, Bipolar, Change in Sleep Pattern, Depression, Fearful and Frequent crying. Endocrine Not Present- Cold Intolerance, Excessive Hunger, Hair Changes, Heat Intolerance, Hot flashes and New Diabetes. Hematology Not Present- Blood Thinners, Easy Bruising, Excessive bleeding, Gland problems, HIV and Persistent Infections.  Vitals (Armen Ferguson CMA; 11/03/2018 3:22 PM) 11/03/2018 3:22 PM Weight: 133 lb Temp.: 97.77F  Pulse: 88 (Regular)  P.OX: 99% (Room air) BP: 130/92 (Sitting, Left Arm, Standard)        Physical Exam Ardeth Sportsman MD; 11/03/2018 3:47 PM)  General Mental Status-Alert. General Appearance-Not in acute distress, Not Sickly. Orientation-Oriented X3. Hydration-Well hydrated. Voice-Normal.  Integumentary Global Assessment Upon inspection and palpation of skin surfaces of the - Axillae: non-tender, no inflammation or ulceration, no drainage. and Distribution of scalp and body hair is normal. General Characteristics Temperature - normal warmth is noted.  Head and Neck Head-normocephalic, atraumatic with no lesions or palpable masses. Face Global Assessment - atraumatic, no absence of expression. Neck Global Assessment - no abnormal movements, no bruit auscultated on the right, no bruit auscultated on the left, no decreased range of motion, non-tender. Trachea-midline. Thyroid Gland Characteristics - non-tender.  Eye Eyeball - Left-Extraocular movements intact, No Nystagmus - Left. Eyeball - Right-Extraocular movements intact, No Nystagmus - Right. Cornea - Left-No Hazy - Left. Cornea - Right-No Hazy - Right. Sclera/Conjunctiva -  Left-No scleral icterus, No Discharge - Left. Sclera/Conjunctiva - Right-No scleral icterus, No Discharge - Right. Pupil - Left-Direct reaction to light normal. Pupil - Right-Direct reaction to light normal.  ENMT Ears Pinna - Left - no drainage observed, no generalized tenderness observed. Pinna - Right - no drainage observed, no generalized tenderness observed. Nose and Sinuses External Inspection of the Nose - no destructive lesion observed. Inspection of the nares - Left - quiet respiration. Inspection of the nares - Right - quiet respiration. Mouth and Throat Lips - Upper Lip - no fissures observed, no pallor noted. Lower Lip - no fissures observed, no pallor noted. Nasopharynx - no discharge present. Oral Cavity/Oropharynx - Tongue - no dryness observed. Oral Mucosa - no cyanosis observed. Hypopharynx - no evidence of airway distress observed.  Chest and Lung Exam Inspection Movements - Normal and Symmetrical. Accessory muscles - No use of accessory muscles in breathing. Palpation Palpation of the chest reveals - Non-tender. Auscultation Breath sounds - Normal and Clear.  Cardiovascular Auscultation Rhythm - Regular. Murmurs & Other Heart Sounds - Auscultation of the heart reveals - No Murmurs and No Systolic Clicks.  Abdomen Inspection Inspection of the abdomen reveals - No Visible peristalsis and No Abnormal pulsations. Umbilicus - No Bleeding, No Urine drainage. Palpation/Percussion Palpation and Percussion of the abdomen reveal - Soft, Non Tender, No Rebound tenderness, No Rigidity (guarding) and No Cutaneous hyperesthesia. Note: Abdomen soft. Not severely distended. No distasis recti. No umbilical or other anterior abdominal wall hernias  Female Genitourinary Sexual Maturity Tanner 5 - Adult hair pattern. Note: No vaginal bleeding nor discharge  Rectal Note: Right anterior partially prolapsed hemorrhoid with external tag.  Please refer to anoscopy.  Grade 1 internal hemorrhoids. Normal sphincter tone. No fissure. No fistula. No abscess. No major pruritus. No pilonidal disease. No obvious rectal masses felt.  Peripheral Vascular Upper Extremity Inspection - Left - No Cyanotic nailbeds - Left, Not Ischemic. Inspection - Right - No Cyanotic nailbeds - Right, Not Ischemic.  Neurologic Neurologic evaluation reveals -normal attention span and ability to concentrate, able to name objects and repeat phrases.  Appropriate fund of knowledge , normal sensation and normal coordination. Mental Status Affect - not angry, not paranoid. Cranial Nerves-Normal Bilaterally. Gait-Normal.  Neuropsychiatric Mental status exam performed with findings of-able to articulate well with normal speech/language, rate, volume and coherence, thought content normal with ability to perform basic computations and apply abstract reasoning and no evidence of hallucinations, delusions, obsessions or homicidal/suicidal ideation.  Musculoskeletal Global Assessment Spine, Ribs and Pelvis - no instability, subluxation or laxity. Right Upper Extremity - no instability, subluxation or laxity.  Lymphatic Head & Neck  General Head & Neck Lymphatics: Bilateral - Description - No Localized lymphadenopathy. Axillary  General Axillary Region: Bilateral - Description - No Localized lymphadenopathy. Femoral & Inguinal  Generalized Femoral & Inguinal Lymphatics: Left - Description - No Localized lymphadenopathy. Right - Description - No Localized lymphadenopathy.   Results Ardeth Sportsman MD; 11/03/2018 3:58 PM) Procedures  Name Value Date Hemorrhoids Procedure Anal exam: External Hemorrhoid Internal exam: Internal Hemorroids ( non-bleeding) prolapse Other: Right anterior partially prolapsed hemorrhoid with external tag............Marland KitchenGrade 1 internal hemorrhoids. Normal sphincter tone. No fissure. No fistula. No abscess. No major pruritus. No  pilonidal disease. No obvious rectal masses felt. Examination down decubitus position. Female CMA in room for H&P  Performed: 11/03/2018 3:48 PM    Assessment & Plan Ardeth Sportsman MD; 11/03/2018 3:58 PM)  EXTERNAL HEMORRHOIDS WITH COMPLICATION (K64.4) Impression: Irritated right anterior hemorrhoid persistent for 5 years. I think she wishes to be more aggressive and get it treated with surgical removal. Would plan outpatient surgery. Hemorrhoidal ligation/pexy. Hemorrhoidectomy. She is interested in proceeding.  Current Plans The anatomy & physiology of the anorectal region was discussed. The pathophysiology of hemorrhoids and differential diagnosis was discussed. Natural history risks without surgery was discussed. I stressed the importance of a bowel regimen to have daily soft bowel movements to minimize progression of disease. Interventions such as sclerotherapy & banding were discussed.  The patient's symptoms are not adequately controlled by medicines and other non-operative treatments. I feel the risks & problems of no surgery outweigh the operative risks; therefore, I recommended surgery to treat the hemorrhoids by ligation, pexy, and possible resection.  Risks such as bleeding, infection, urinary difficulties, need for further treatment, heart attack, death, and other risks were discussed. I noted a good likelihood this will help address the problem. Goals of post-operative recovery were discussed as well. Possibility that this will not correct all symptoms was explained. Post-operative pain, bleeding, constipation, and other problems after surgery were discussed. We will work to minimize complications. Educational handouts further explaining the pathology, treatment options, and bowel regimen were given as well. Questions were answered. The patient expresses understanding & wishes to proceed with surgery.  You are being scheduled for surgery- Our schedulers will call  you.  You should hear from our office's scheduling department within 5 working days about the location, date, and time of surgery. We try to make accommodations for patient's preferences in scheduling surgery, but sometimes the OR schedule or the surgeon's schedule prevents [place] from making those accommodations.  If you have not heard from our office [phone] in 5 working days, call the office and ask for your surgeon's nurse.  If you have other questions about your diagnosis, plan, or surgery, call the office and ask for your surgeon's nurse.   PROLAPSED INTERNAL HEMORRHOIDS, GRADE 4 (K64.3)  Current Plans ANOSCOPY, DIAGNOSTIC (09811) Pt Education - [facility] Hemorrhoids (Sheana Bir): discussed with patient and provided information.  PROLAPSED INTERNAL HEMORRHOIDS, GRADE 1 Impression: The  anatomy & physiology of the anorectal region was discussed. The pathophysiology of hemorrhoids and differential diagnosis was discussed. Natural history progression was discussed. I stressed the importance of a bowel regimen to have daily soft bowel movements to minimize progression of disease. Goal of one BM / day ideal. Use of wet wipes, warm baths, avoiding straining, etc were emphasized.  Educational handouts further explaining the pathology, treatment options, and bowel regimen were given as well. The patient expressed understanding.  Current Plans Pt Education - [facility] Good Bowel Health (Roswell Ndiaye)  ENCOUNTER FOR PREOPERATIVE EXAMINATION FOR GENERAL SURGICAL PROCEDURE (Z01.818)  Current Plans Pt Education - [facility] Rectal Prep for Anorectal outpatient/office surgery: discussed with patient and provided information. Pt Education - [facility] Rectal Surgery HCI (Lorah Kalina): discussed with patient and provided information.  Adin Hector, MD, FACS, MASCRS Gastrointestinal and Minimally Invasive Surgery  [facility] [address], [place], [place] [address] [phone] Main / Paging [phone] Fax

## 2018-11-19 ENCOUNTER — Other Ambulatory Visit: Payer: 59

## 2018-11-26 ENCOUNTER — Other Ambulatory Visit: Payer: 59

## 2019-01-26 ENCOUNTER — Telehealth: Payer: Self-pay | Admitting: Family Medicine

## 2019-01-26 NOTE — Telephone Encounter (Signed)
Hey Good Morning, Could you pls help me by changing this appointment type to Cpe for patient 06/18/2019 @0900 

## 2019-01-27 NOTE — Telephone Encounter (Signed)
Done, Tiffany next time when you have a request like this send it in teams and not the patients chart

## 2019-01-28 ENCOUNTER — Other Ambulatory Visit: Payer: Managed Care, Other (non HMO)

## 2019-01-29 ENCOUNTER — Encounter: Payer: 59 | Admitting: Family Medicine

## 2019-02-04 ENCOUNTER — Other Ambulatory Visit: Payer: Managed Care, Other (non HMO)

## 2019-02-17 ENCOUNTER — Ambulatory Visit: Payer: Managed Care, Other (non HMO) | Attending: Internal Medicine

## 2019-02-17 DIAGNOSIS — Z20822 Contact with and (suspected) exposure to covid-19: Secondary | ICD-10-CM

## 2019-02-18 LAB — NOVEL CORONAVIRUS, NAA: SARS-CoV-2, NAA: NOT DETECTED

## 2019-03-26 ENCOUNTER — Ambulatory Visit: Payer: Managed Care, Other (non HMO)

## 2019-06-18 ENCOUNTER — Encounter: Payer: Managed Care, Other (non HMO) | Admitting: Family Medicine

## 2019-08-26 ENCOUNTER — Telehealth (INDEPENDENT_AMBULATORY_CARE_PROVIDER_SITE_OTHER): Payer: 59 | Admitting: Medical

## 2019-08-26 VITALS — BP 125/79 | HR 84

## 2019-08-26 DIAGNOSIS — J029 Acute pharyngitis, unspecified: Secondary | ICD-10-CM

## 2019-08-26 MED ORDER — AMOXICILLIN 875 MG PO TABS: 875.0000 mg | ORAL_TABLET | Freq: Two times a day (BID) | ORAL | 0 refills | Status: DC

## 2019-08-26 NOTE — Patient Instructions (Signed)
Possible strep pharyngitis based on recent described history and described appearance of throat.  Response to low-dose amoxicillin for only 3 days.  Possible ineffective dose and inadequate duration.  Decided to prescribe amoxicillin 875 mg 20 tablets 1 tablet p.o. twice daily for 10 days.  Counseled patient that if she does have strep then she should respond to treatment.  If signs symptoms persist, worsen or change let us know in that event would need to reevaluate.  Follow-up in 7 to 10 days or as needed.  Asked patient to give [place] an update in 7 days by my chart.

## 2019-08-26 NOTE — Progress Notes (Signed)
° °  Subjective:    Patient ID: Marisa Contreras, female    DOB: [birth year], 44 y.o.   MRN: [id number]  HPI  Virtual Visit via Telephone Note  I connected with Marisa Contreras on 08/26/19 at  3:40 PM EDT by telephone and verified that I am speaking with the correct person using two identifiers.  Location: Patient: home Provider: office   I discussed the limitations, risks, security and privacy concerns of performing an evaluation and management service by telephone and the availability of in person appointments. I also discussed with the patient that there may be a patient responsible charge related to this service. The patient expressed understanding and agreed to proceed.  BP 125/78 pulse 84.  History of Present Illness:  Pt in with st yesterday and today. Pt states had strep 2 weeks ago. On review did not know for sure but assumed was strep/not tested She took amoxicillin for 2-3 days only. Low dose since her son is 5 mg. Lymph nodes mild swollen. Mild pain on swallowing. No sob or wheezing. No fever, no chills or sweats. During days on antibiotic felt better. But one week after treatment throat started hurting again.  Pt has been vaccinated against covid. Got J and J vaccine.      Observations/Objective: General- no acute distress. Normal speech. Not labored. Mouth- pt report red pharynx. Mild tonsil hypertropy. Neck- states sumbandibular nodes mild swollen.    Assessment and Plan: Possible strep pharyngitis based on recent described history and described appearance of throat.  Response to low-dose amoxicillin for only 3 days.  Possible ineffective dose and inadequate duration.  Decided to prescribe amoxicillin 875 mg 20 tablets 1 tablet p.o. twice daily for 10 days.  Counseled patient that if she does have strep then she should respond to treatment.  If signs symptoms persist, worsen or change let us know in that event would need to reevaluate.  Follow-up in 7 to 10 days  or as needed.  Asked patient to give [place] an update in 7 days by my chart.  Follow Up Instructions:    I discussed the assessment and treatment plan with the patient. The patient was provided an opportunity to ask questions and all were answered. The patient agreed with the plan and demonstrated an understanding of the instructions.   The patient was advised to call back or seek an in-person evaluation if the symptoms worsen or if the condition fails to improve as anticipated.  I provided  of non-face-to-face time during this encounter.   Esperanza Richters, PA-C   Review of Systems  Constitutional: Negative for chills, fatigue and fever.  HENT: Positive for sore throat. Negative for congestion.   Respiratory: Negative for cough, shortness of breath and wheezing.   Cardiovascular: Negative for chest pain and palpitations.  Gastrointestinal: Negative for abdominal pain.  Musculoskeletal: Negative for back pain and myalgias.  Skin: Negative for rash.  Neurological: Negative for dizziness, speech difficulty, weakness and headaches.  Hematological: Positive for adenopathy. Does not bruise/bleed easily.       Objective:   Physical Exam        Assessment & Plan:

## 2019-10-16 ENCOUNTER — Other Ambulatory Visit: Payer: Self-pay | Admitting: Family Medicine

## 2019-10-16 NOTE — Telephone Encounter (Signed)
Medication: losartan (COZAAR) 25 MG tablet    Has the patient contacted their pharmacy? No. (If no, request that the patient contact the pharmacy for the refill.) (If yes, when and what did the pharmacy advise?)  Preferred Pharmacy (with phone number or street name): [facility] [phone] [place] Otto, [place] - [address]  [address] [place], [place] [address]  Phone:  [phone] Fax:  [phone]  DEA #:  [id number]  Agent: Please be advised that RX refills may take up to 3 business days. We ask that you follow-up with your pharmacy.

## 2019-10-19 NOTE — Telephone Encounter (Signed)
Spoke with pharmacy and it was sent it in

## 2020-06-07 ENCOUNTER — Telehealth (INDEPENDENT_AMBULATORY_CARE_PROVIDER_SITE_OTHER): Payer: 59 | Admitting: Family

## 2020-06-07 ENCOUNTER — Other Ambulatory Visit: Payer: Self-pay

## 2020-06-07 DIAGNOSIS — J019 Acute sinusitis, unspecified: Secondary | ICD-10-CM | POA: Diagnosis not present

## 2020-06-07 MED ORDER — AMOXICILLIN-POT CLAVULANATE 875-125 MG PO TABS: 1.0000 | ORAL_TABLET | Freq: Two times a day (BID) | ORAL | 0 refills | Status: AC
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
## 2020-12-09 IMAGING — US ULTRASOUND ABDOMEN COMPLETE
1 series · 13 of 25 positions shown · non-contrast
Comparison: None.

CLINICAL DATA: Abdominal pain and elevated liver enzymes

EXAM:
ABDOMEN ULTRASOUND COMPLETE

[Series 1: ultrasound abdomen complete · 13 of 125 slices shown]
[im 1/125]
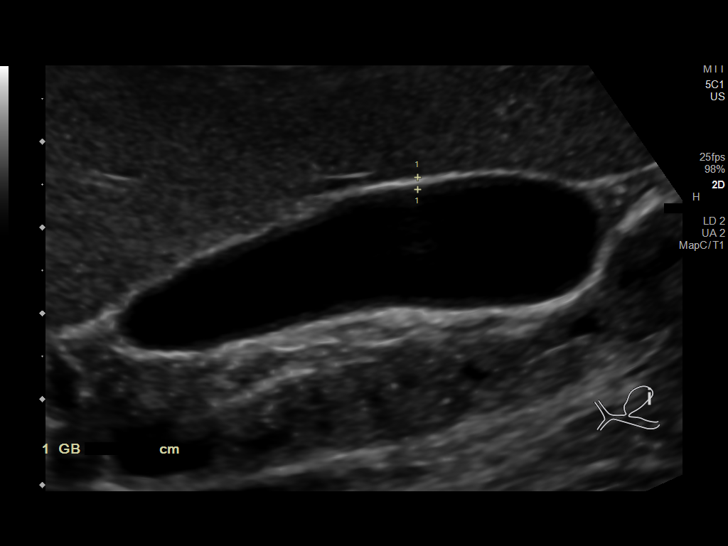
[im 11/125]
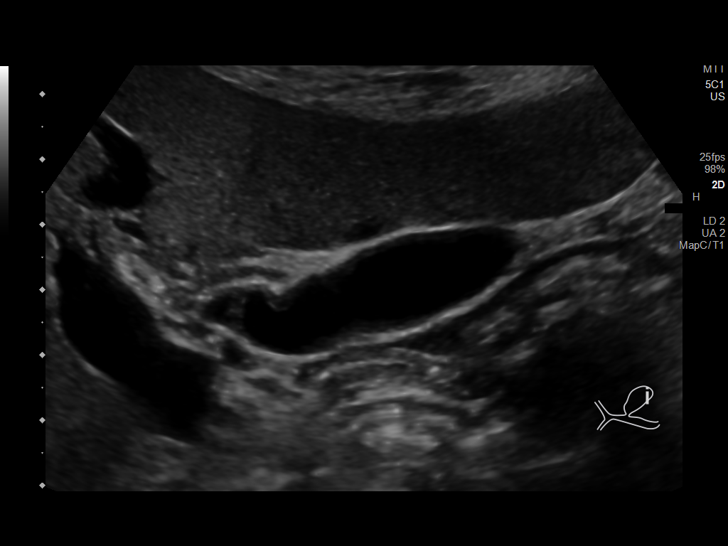
[im 21/125]
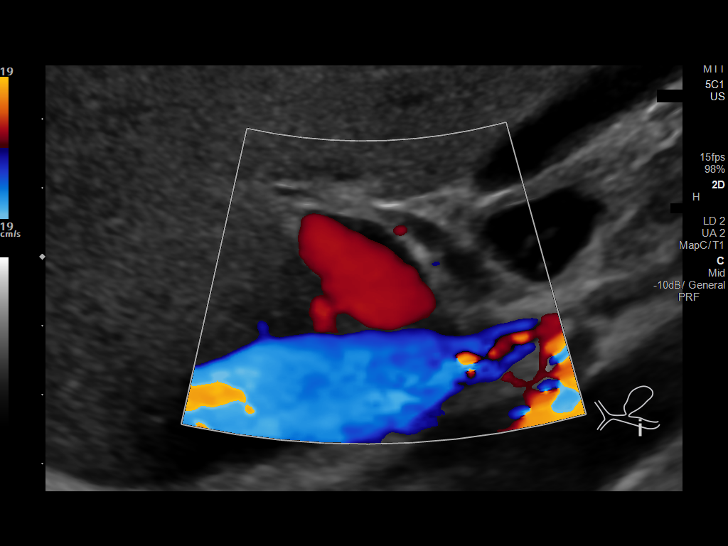
[im 32/125]
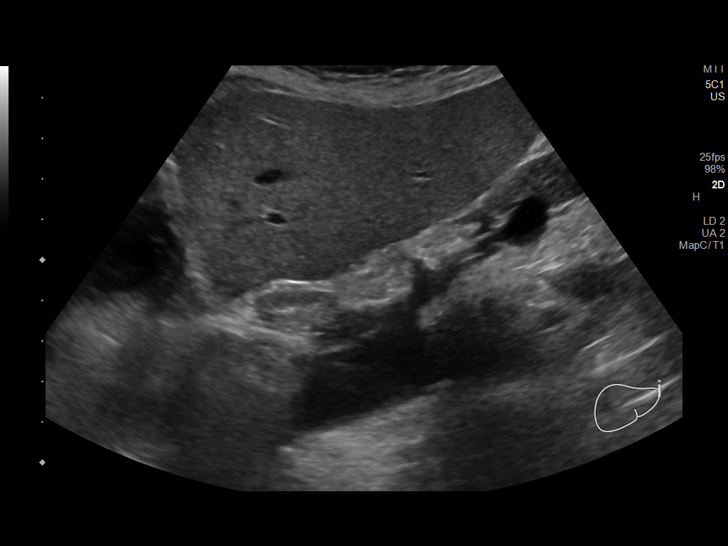
[im 42/125]
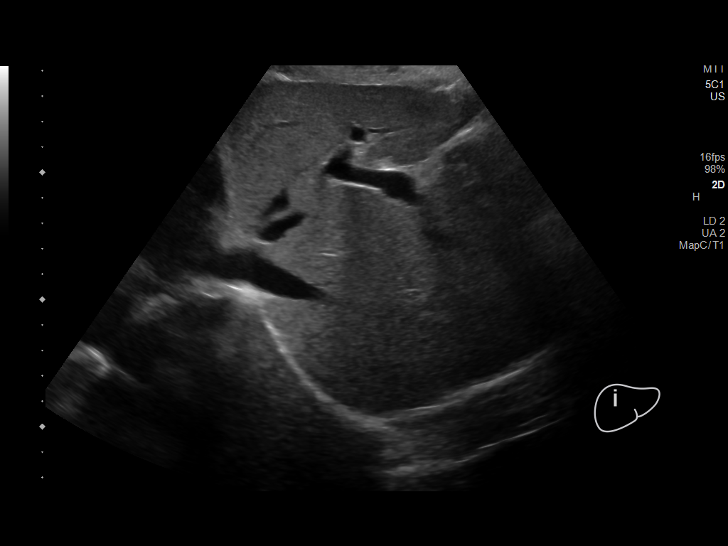
[im 52/125]
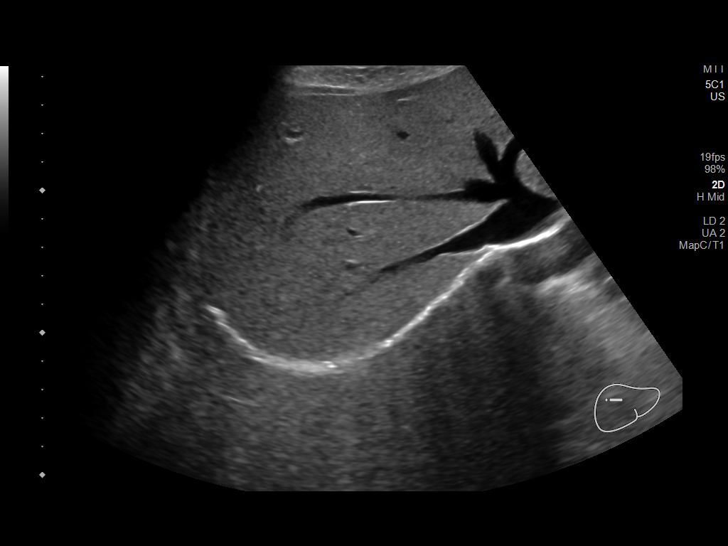
[im 63/125]
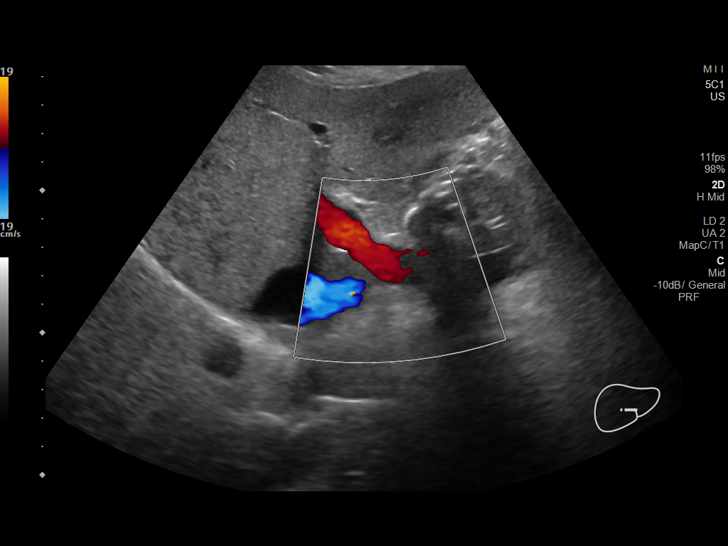
[im 73/125]
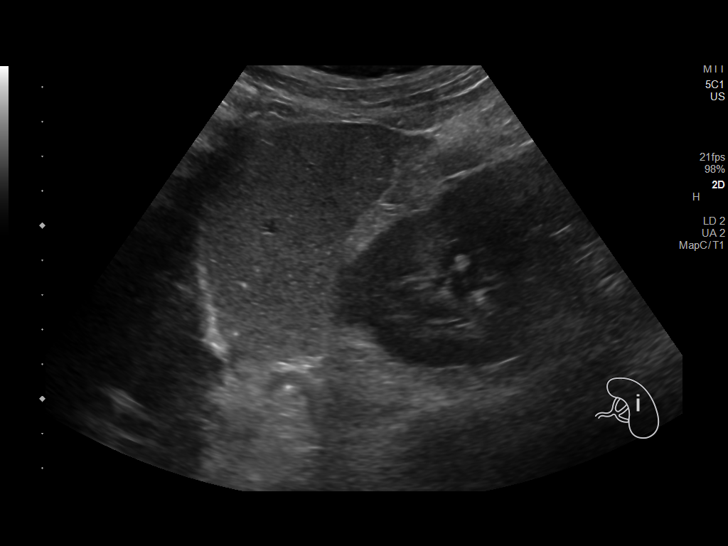
[im 83/125]
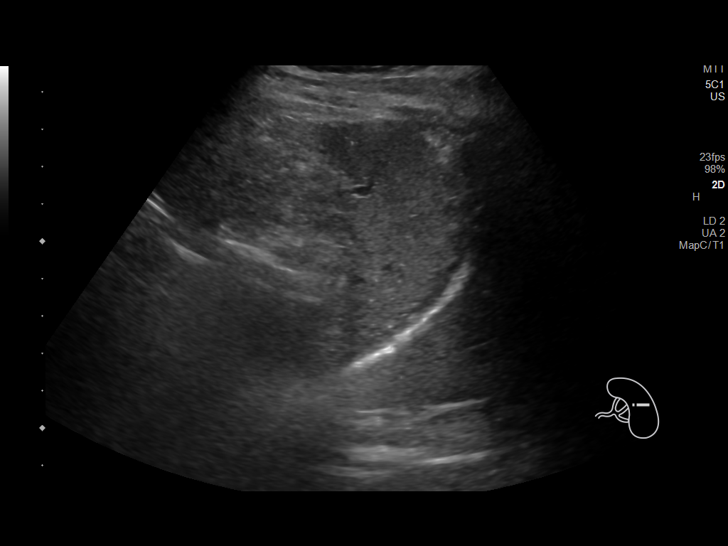
[im 94/125]
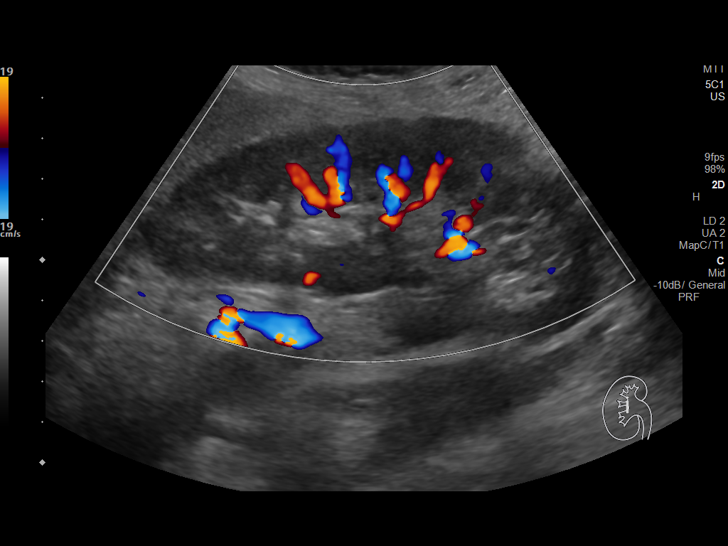
[im 104/125]
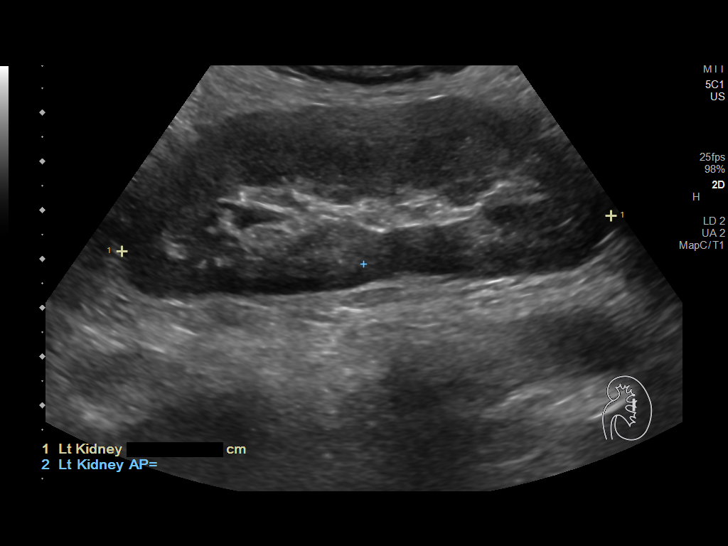
[im 114/125]
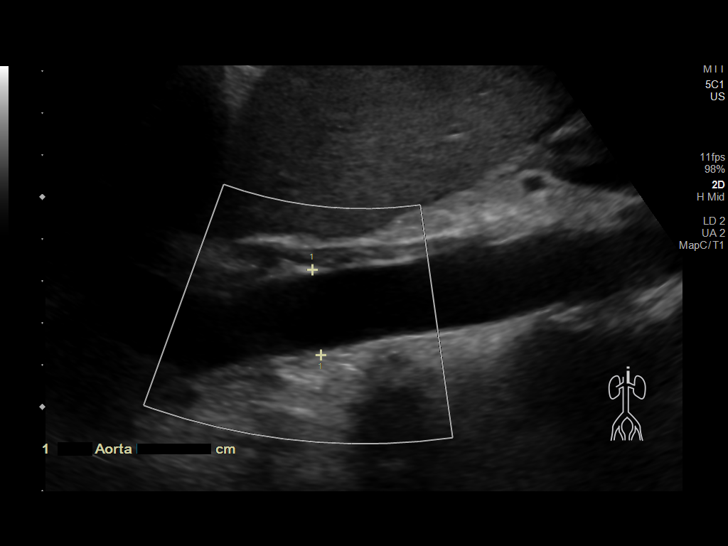
[im 125/125]
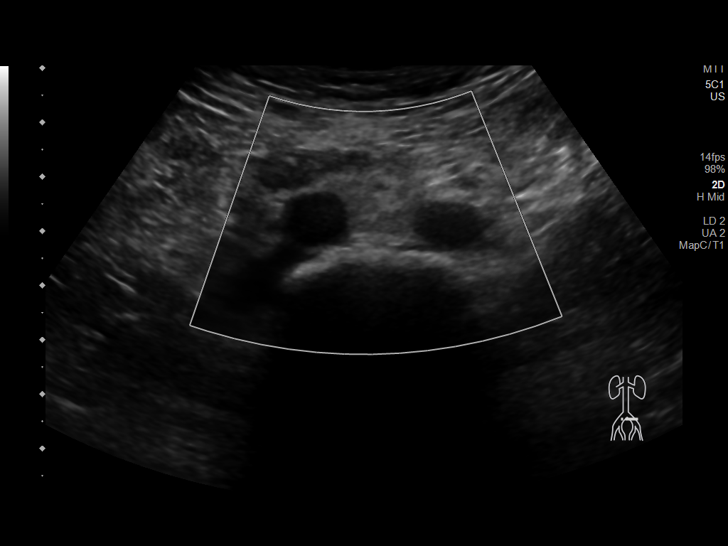

[13 of 25 positions shown; findings below may reference images not displayed]

FINDINGS: Gallbladder: No gallstones or wall thickening visualized. There is
no pericholecystic fluid. No sonographic Murphy sign noted by
sonographer.

Common bile duct: Diameter: 4 mm. No intrahepatic, common hepatic,
or common bile duct dilatation.

Liver: There is an echogenic focus in the midportion of the right
lobe of the liver measuring 0.8 x 0.7 x 0.7 cm. There is an apparent
cyst more laterally in the right lobe measuring 0.8 x 0.9 x 1.7 cm.
No other focal liver lesions evident. Within normal limits in
parenchymal echogenicity. Portal vein is patent on color Doppler
imaging with normal direction of blood flow towards the liver.

IVC: No abnormality visualized.

Pancreas: Visualized portion unremarkable.

Spleen: No splenic lesions beyond apparent small calcified
granulomas. Splenic size normal.

Right Kidney: Length: 11.1 cm. Echogenicity within normal limits. No
mass or hydronephrosis visualized.

Left Kidney: Length: 10.1 cm. Echogenicity within normal limits. No
mass or hydronephrosis visualized.

Abdominal aorta: No aneurysm visualized.

Other findings: No demonstrable ascites.
IMPRESSION: 1. Probable small hemangioma in the right lobe of the liver. As
there are no prior studies to compare, a follow-up ultrasound of the
liver in 1 year to compared for stability advised. Simple cyst also
noted in right lobe of liver. Liver otherwise appears unremarkable.

2.  Probable splenic granulomas.

3.  Study otherwise unremarkable.

## 2021-01-06 IMAGING — DX DG ANKLE COMPLETE 3+V*L*
3 series · 3 of 3 positions shown · non-contrast
Comparison: None.

CLINICAL DATA: Lower leg and ankle pain since [place]
2 weeks ago.

EXAM:
LEFT ANKLE COMPLETE - 3+ VIEW

[ankle ap]
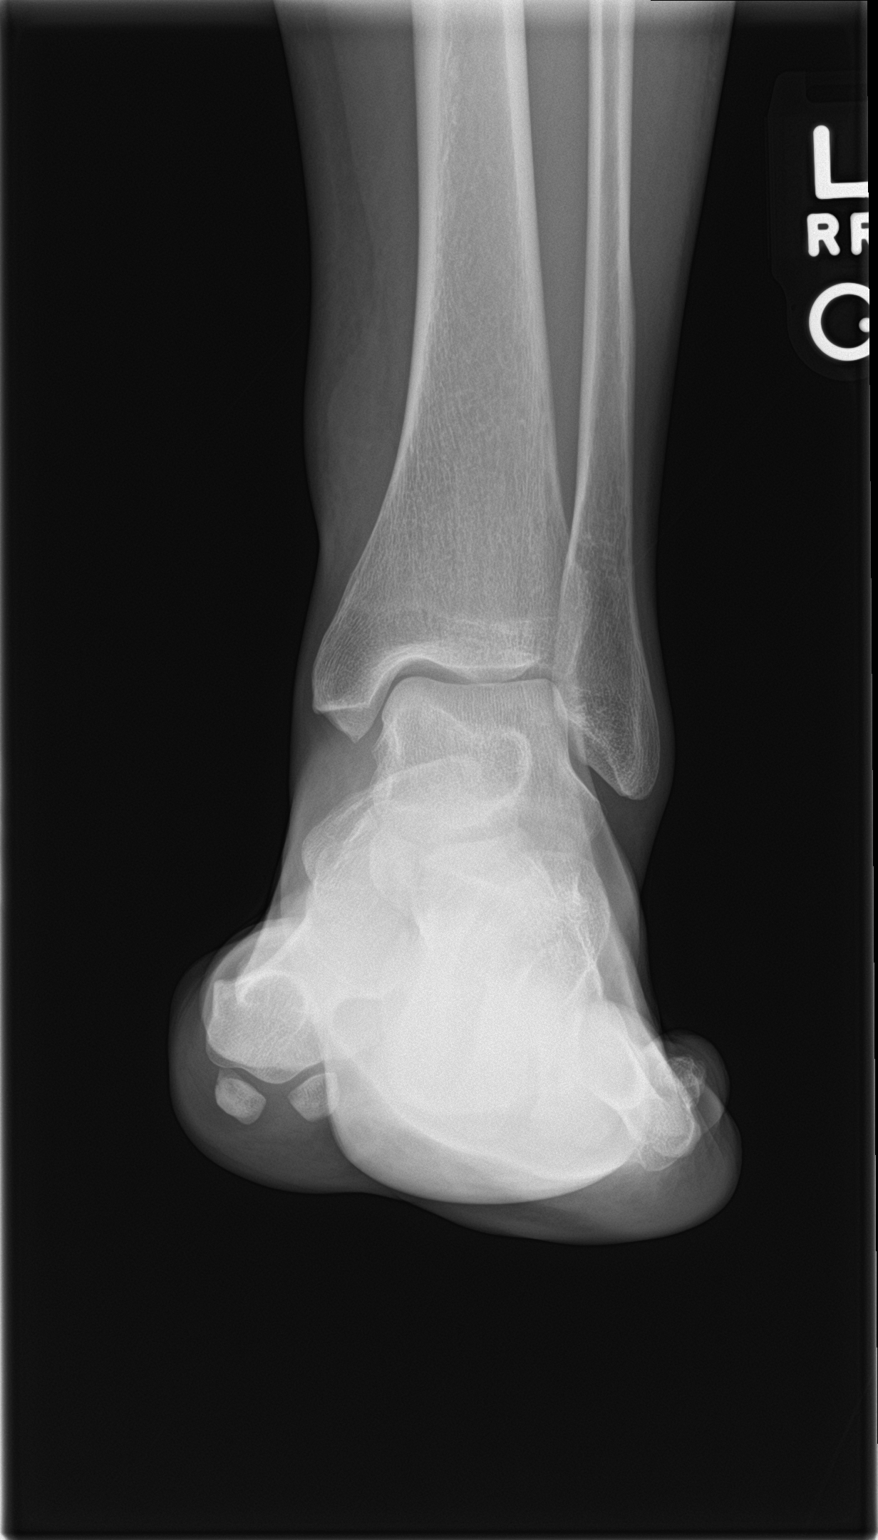

[ankle obl]
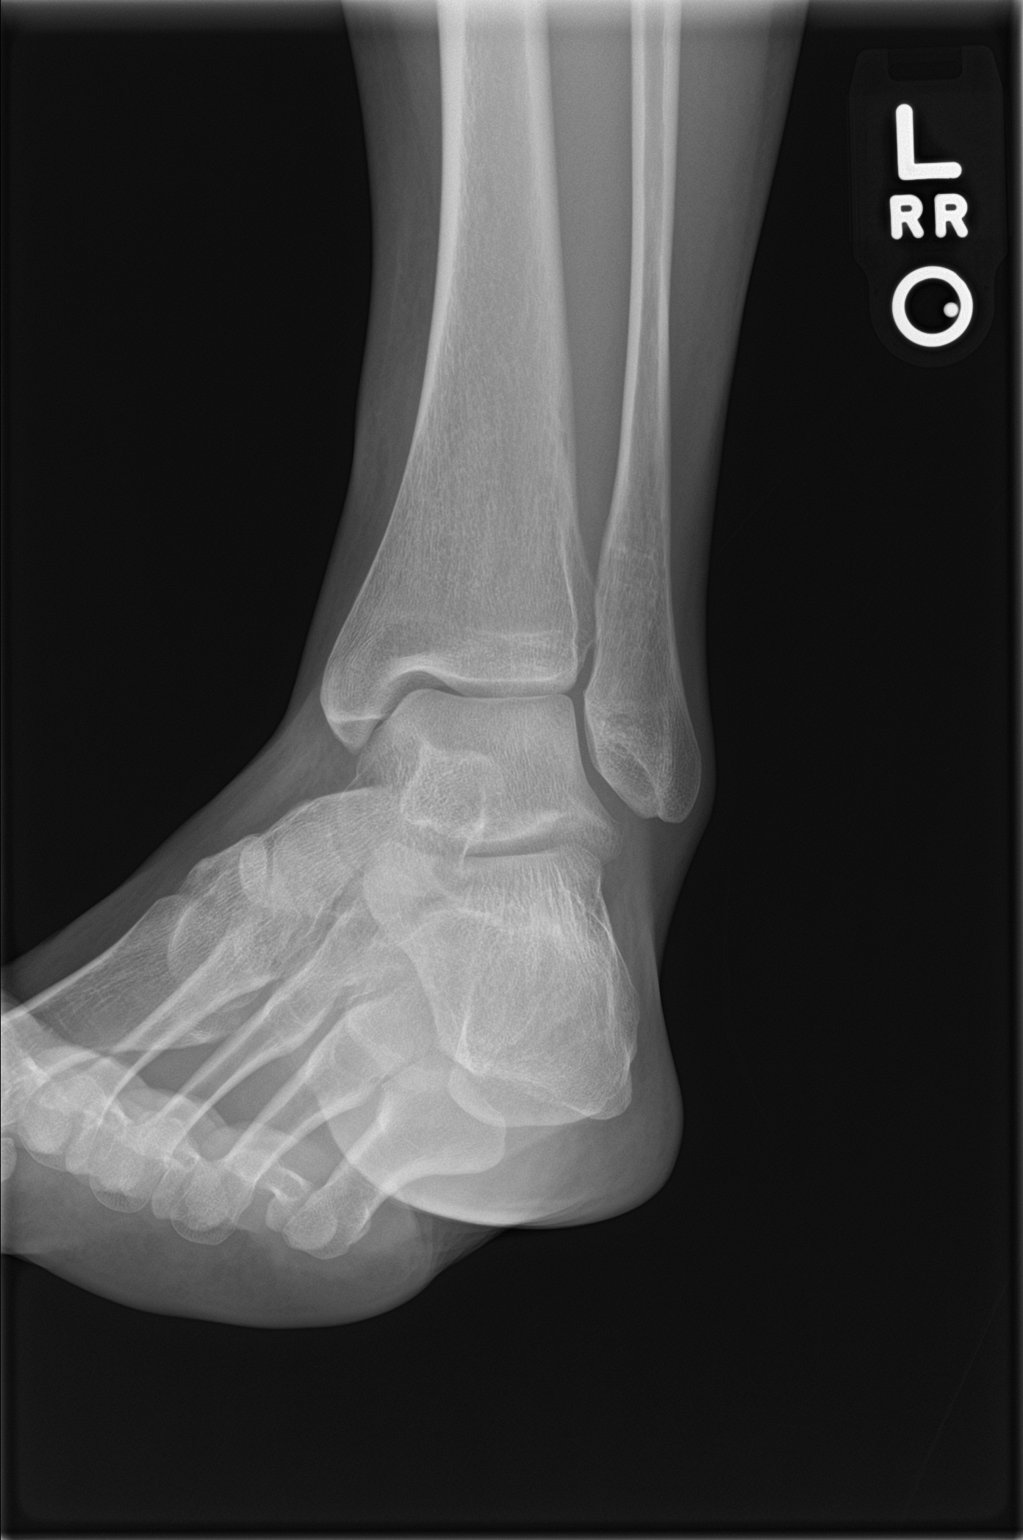

[ankle lat]
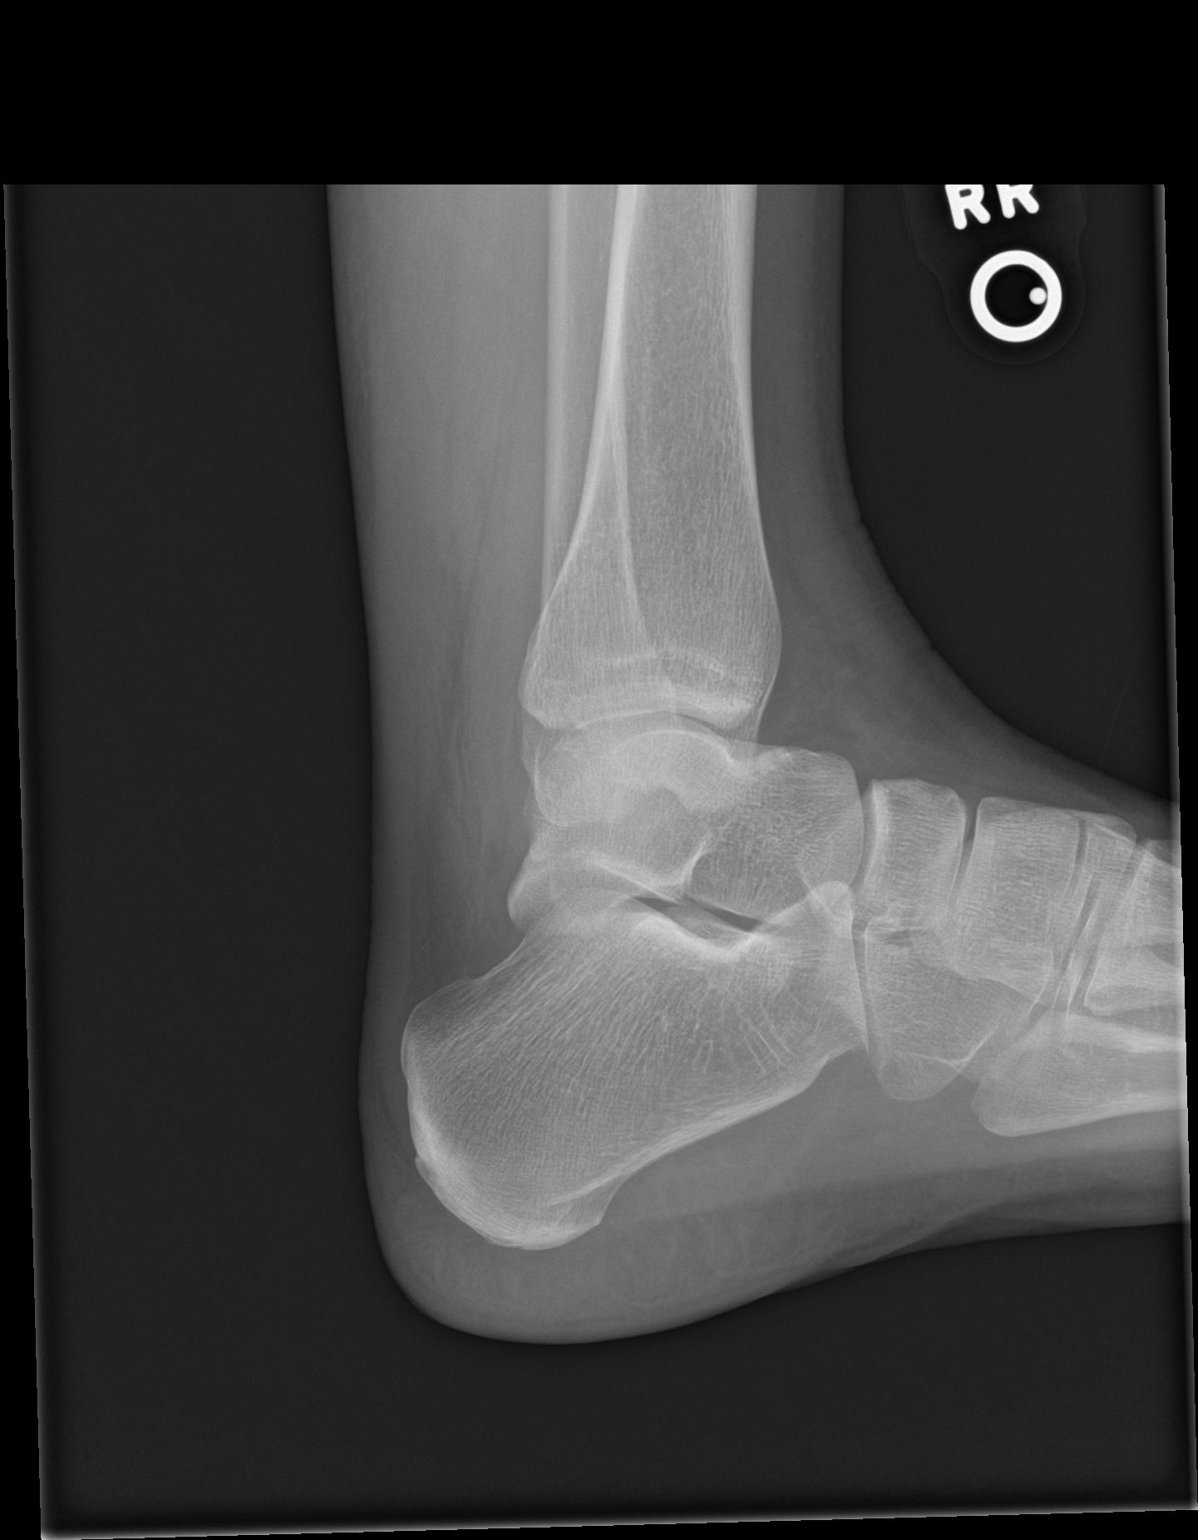

[3 of 3 positions shown; findings below may reference images not displayed]

FINDINGS: No acute fracture or dislocation. The ankle mortise is symmetric.
The talar dome is intact. No tibiotalar joint effusion. Joint spaces
are preserved. Bone mineralization is normal. Focal soft tissue
swelling along the medial distal lower leg.
IMPRESSION: Focal soft tissue swelling along the medial distal lower leg. No
acute osseous abnormality.

## 2022-09-28 ENCOUNTER — Other Ambulatory Visit: Payer: Self-pay | Admitting: Obstetrics & Gynecology

## 2022-09-28 DIAGNOSIS — R188 Other ascites: Secondary | ICD-10-CM

## 2022-10-05 ENCOUNTER — Encounter: Payer: Self-pay | Admitting: Interventional Radiology

## 2022-10-05 ENCOUNTER — Ambulatory Visit
Admission: RE | Admit: 2022-10-05 | Discharge: 2022-10-05 | Disposition: A | Discharge status: Home / Self Care | Payer: 59 | Source: Ambulatory Visit | Attending: Obstetrics & Gynecology | Admitting: Obstetrics & Gynecology

## 2022-10-05 ENCOUNTER — Other Ambulatory Visit: Payer: Self-pay | Admitting: Interventional Radiology

## 2022-10-05 DIAGNOSIS — R188 Other ascites: Secondary | ICD-10-CM

## 2022-10-05 HISTORY — PX: IR RADIOLOGIST EVAL & MGMT: IMG5224
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
## 2022-10-08 ENCOUNTER — Encounter (HOSPITAL_COMMUNITY): Payer: Self-pay | Admitting: Interventional Radiology

## 2022-10-09 ENCOUNTER — Ambulatory Visit (HOSPITAL_COMMUNITY): Payer: 59

## 2022-10-09 ENCOUNTER — Other Ambulatory Visit: Payer: Self-pay | Admitting: Interventional Radiology

## 2022-10-09 DIAGNOSIS — R188 Other ascites: Secondary | ICD-10-CM

## 2022-10-10 ENCOUNTER — Ambulatory Visit (HOSPITAL_COMMUNITY)
Admission: RE | Admit: 2022-10-10 | Discharge: 2022-10-10 | Disposition: A | Discharge status: Home / Self Care | Payer: 59 | Source: Ambulatory Visit | Attending: Interventional Radiology | Admitting: Interventional Radiology

## 2022-10-10 DIAGNOSIS — R188 Other ascites: Secondary | ICD-10-CM | POA: Insufficient documentation

## 2022-10-10 DIAGNOSIS — K7460 Unspecified cirrhosis of liver: Secondary | ICD-10-CM | POA: Diagnosis present

## 2022-10-10 HISTORY — PX: IR PARACENTESIS: IMG2679

## 2022-10-10 LAB — PROTEIN, PLEURAL OR PERITONEAL FLUID: Total protein, fluid: <3 g/dL

## 2022-10-10 LAB — ALBUMIN, PLEURAL OR PERITONEAL FLUID: Albumin, Fluid: <1.5 g/dL

## 2022-10-10 LAB — BODY FLUID CELL COUNT WITH DIFFERENTIAL
Eos, Fluid: 0 %
Lymphs, Fluid: 7 %
Monocyte-Macrophage-Serous Fluid: 93 % — ABNORMAL HIGH (ref 50–90)
Neutrophil Count, Fluid: 0 % (ref 0–25)
Total Nucleated Cell Count, Fluid: 100 uL (ref 0–1000)

## 2022-10-10 MED ORDER — LIDOCAINE HCL 1 % IJ SOLN
20.0000 mL | Freq: Once | INTRAMUSCULAR | Status: AC
  Administered 2022-10-10: 20 mL

## 2022-10-10 MED ORDER — LIDOCAINE HCL 1 % IJ SOLN
INTRAMUSCULAR | Status: AC
  Filled 2022-10-10: qty 20

## 2022-10-10 NOTE — Procedures (Signed)
PROCEDURE SUMMARY:  Successful US guided paracentesis from left lower quadrant.  Yielded 1.6 L of bright yellow fluid.  No immediate complications.  Pt tolerated well.   Specimen sent for labs.  EBL < 2 mL  Mickie Kay, NP 10/10/2022 2:38 PM

## 2022-10-11 LAB — CYTOLOGY - NON PAP

## 2022-10-16 ENCOUNTER — Ambulatory Visit
Admission: RE | Admit: 2022-10-16 | Discharge: 2022-10-16 | Disposition: A | Discharge status: Home / Self Care | Payer: 59 | Source: Ambulatory Visit | Attending: Interventional Radiology | Admitting: Interventional Radiology

## 2022-10-16 DIAGNOSIS — R188 Other ascites: Secondary | ICD-10-CM

## 2022-10-24 ENCOUNTER — Ambulatory Visit
Admission: RE | Admit: 2022-10-24 | Discharge: 2022-10-24 | Disposition: A | Discharge status: Home / Self Care | Payer: 59 | Source: Ambulatory Visit | Attending: Interventional Radiology

## 2022-10-24 ENCOUNTER — Encounter (HOSPITAL_COMMUNITY): Payer: Self-pay | Admitting: Interventional Radiology

## 2022-10-24 ENCOUNTER — Telehealth (HOSPITAL_COMMUNITY): Payer: Self-pay

## 2022-10-24 ENCOUNTER — Encounter: Payer: Self-pay | Admitting: Interventional Radiology

## 2022-10-24 ENCOUNTER — Other Ambulatory Visit (HOSPITAL_COMMUNITY): Payer: Self-pay | Admitting: Interventional Radiology

## 2022-10-24 DIAGNOSIS — R188 Other ascites: Secondary | ICD-10-CM

## 2022-10-24 DIAGNOSIS — K769 Liver disease, unspecified: Secondary | ICD-10-CM

## 2022-10-24 HISTORY — PX: IR RADIOLOGIST EVAL & MGMT: IMG5224

## 2022-10-24 NOTE — Telephone Encounter (Signed)
Called to schedule transjugular biopsy w/Dr. Loreta Ave. No answer, left vm. AB
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
## 2022-11-07 ENCOUNTER — Other Ambulatory Visit: Payer: Self-pay | Admitting: Radiology

## 2022-11-07 DIAGNOSIS — K766 Portal hypertension: Secondary | ICD-10-CM

## 2022-11-08 ENCOUNTER — Ambulatory Visit (HOSPITAL_COMMUNITY)
Admission: RE | Admit: 2022-11-08 | Discharge: 2022-11-08 | Disposition: A | Discharge status: Home / Self Care | Payer: 59 | Source: Ambulatory Visit | Attending: Interventional Radiology | Admitting: Interventional Radiology

## 2022-11-08 ENCOUNTER — Emergency Department (HOSPITAL_COMMUNITY): Payer: 59

## 2022-11-08 ENCOUNTER — Inpatient Hospital Stay (HOSPITAL_COMMUNITY)
Admission: EM | Admit: 2022-11-08 | Discharge: 2022-11-14 | DRG: 432 | Disposition: A | Discharge status: Home / Self Care | Payer: 59 | Attending: Internal Medicine | Admitting: Internal Medicine

## 2022-11-08 ENCOUNTER — Other Ambulatory Visit: Payer: Self-pay

## 2022-11-08 ENCOUNTER — Encounter (HOSPITAL_COMMUNITY): Payer: Self-pay

## 2022-11-08 ENCOUNTER — Inpatient Hospital Stay (HOSPITAL_COMMUNITY): Payer: 59

## 2022-11-08 ENCOUNTER — Encounter (HOSPITAL_COMMUNITY): Payer: Self-pay | Admitting: Emergency Medicine

## 2022-11-08 DIAGNOSIS — Z1152 Encounter for screening for COVID-19: Secondary | ICD-10-CM

## 2022-11-08 DIAGNOSIS — K640 First degree hemorrhoids: Secondary | ICD-10-CM | POA: Diagnosis present

## 2022-11-08 DIAGNOSIS — K2100 Gastro-esophageal reflux disease with esophagitis, without bleeding: Secondary | ICD-10-CM | POA: Diagnosis not present

## 2022-11-08 DIAGNOSIS — K529 Noninfective gastroenteritis and colitis, unspecified: Secondary | ICD-10-CM | POA: Diagnosis present

## 2022-11-08 DIAGNOSIS — K5521 Angiodysplasia of colon with hemorrhage: Secondary | ICD-10-CM | POA: Diagnosis present

## 2022-11-08 DIAGNOSIS — E538 Deficiency of other specified B group vitamins: Secondary | ICD-10-CM | POA: Diagnosis present

## 2022-11-08 DIAGNOSIS — I8500 Esophageal varices without bleeding: Secondary | ICD-10-CM | POA: Diagnosis not present

## 2022-11-08 DIAGNOSIS — F1010 Alcohol abuse, uncomplicated: Secondary | ICD-10-CM | POA: Diagnosis present

## 2022-11-08 DIAGNOSIS — D539 Nutritional anemia, unspecified: Secondary | ICD-10-CM | POA: Diagnosis present

## 2022-11-08 DIAGNOSIS — Z87891 Personal history of nicotine dependence: Secondary | ICD-10-CM

## 2022-11-08 DIAGNOSIS — I10 Essential (primary) hypertension: Secondary | ICD-10-CM | POA: Diagnosis present

## 2022-11-08 DIAGNOSIS — K7460 Unspecified cirrhosis of liver: Secondary | ICD-10-CM | POA: Diagnosis not present

## 2022-11-08 DIAGNOSIS — Z79899 Other long term (current) drug therapy: Secondary | ICD-10-CM

## 2022-11-08 DIAGNOSIS — I8510 Secondary esophageal varices without bleeding: Secondary | ICD-10-CM | POA: Diagnosis present

## 2022-11-08 DIAGNOSIS — K766 Portal hypertension: Secondary | ICD-10-CM

## 2022-11-08 DIAGNOSIS — K3189 Other diseases of stomach and duodenum: Secondary | ICD-10-CM

## 2022-11-08 DIAGNOSIS — R195 Other fecal abnormalities: Secondary | ICD-10-CM | POA: Insufficient documentation

## 2022-11-08 DIAGNOSIS — E43 Unspecified severe protein-calorie malnutrition: Secondary | ICD-10-CM | POA: Diagnosis present

## 2022-11-08 DIAGNOSIS — R32 Unspecified urinary incontinence: Secondary | ICD-10-CM | POA: Diagnosis present

## 2022-11-08 DIAGNOSIS — D649 Anemia, unspecified: Principal | ICD-10-CM | POA: Diagnosis present

## 2022-11-08 DIAGNOSIS — E8809 Other disorders of plasma-protein metabolism, not elsewhere classified: Secondary | ICD-10-CM | POA: Diagnosis present

## 2022-11-08 DIAGNOSIS — E038 Other specified hypothyroidism: Secondary | ICD-10-CM | POA: Diagnosis not present

## 2022-11-08 DIAGNOSIS — K922 Gastrointestinal hemorrhage, unspecified: Secondary | ICD-10-CM

## 2022-11-08 DIAGNOSIS — R188 Other ascites: Secondary | ICD-10-CM | POA: Diagnosis not present

## 2022-11-08 DIAGNOSIS — K7031 Alcoholic cirrhosis of liver with ascites: Secondary | ICD-10-CM | POA: Diagnosis not present

## 2022-11-08 DIAGNOSIS — Z833 Family history of diabetes mellitus: Secondary | ICD-10-CM

## 2022-11-08 DIAGNOSIS — D500 Iron deficiency anemia secondary to blood loss (chronic): Secondary | ICD-10-CM | POA: Diagnosis present

## 2022-11-08 DIAGNOSIS — K5520 Angiodysplasia of colon without hemorrhage: Secondary | ICD-10-CM | POA: Diagnosis not present

## 2022-11-08 DIAGNOSIS — K769 Liver disease, unspecified: Secondary | ICD-10-CM | POA: Insufficient documentation

## 2022-11-08 DIAGNOSIS — F419 Anxiety disorder, unspecified: Secondary | ICD-10-CM | POA: Diagnosis present

## 2022-11-08 DIAGNOSIS — E871 Hypo-osmolality and hyponatremia: Secondary | ICD-10-CM | POA: Diagnosis present

## 2022-11-08 DIAGNOSIS — K7682 Hepatic encephalopathy: Secondary | ICD-10-CM | POA: Diagnosis not present

## 2022-11-08 DIAGNOSIS — I959 Hypotension, unspecified: Secondary | ICD-10-CM | POA: Diagnosis present

## 2022-11-08 DIAGNOSIS — D6959 Other secondary thrombocytopenia: Secondary | ICD-10-CM | POA: Diagnosis present

## 2022-11-08 DIAGNOSIS — E876 Hypokalemia: Secondary | ICD-10-CM | POA: Diagnosis present

## 2022-11-08 DIAGNOSIS — E039 Hypothyroidism, unspecified: Secondary | ICD-10-CM | POA: Diagnosis present

## 2022-11-08 DIAGNOSIS — Z539 Procedure and treatment not carried out, unspecified reason: Secondary | ICD-10-CM | POA: Insufficient documentation

## 2022-11-08 DIAGNOSIS — Z8249 Family history of ischemic heart disease and other diseases of the circulatory system: Secondary | ICD-10-CM

## 2022-11-08 DIAGNOSIS — Z83438 Family history of other disorder of lipoprotein metabolism and other lipidemia: Secondary | ICD-10-CM

## 2022-11-08 DIAGNOSIS — Z811 Family history of alcohol abuse and dependence: Secondary | ICD-10-CM

## 2022-11-08 DIAGNOSIS — Z79890 Hormone replacement therapy (postmenopausal): Secondary | ICD-10-CM

## 2022-11-08 DIAGNOSIS — R17 Unspecified jaundice: Secondary | ICD-10-CM

## 2022-11-08 DIAGNOSIS — D731 Hypersplenism: Secondary | ICD-10-CM | POA: Diagnosis present

## 2022-11-08 DIAGNOSIS — E8770 Fluid overload, unspecified: Secondary | ICD-10-CM | POA: Diagnosis present

## 2022-11-08 DIAGNOSIS — N179 Acute kidney failure, unspecified: Secondary | ICD-10-CM | POA: Diagnosis present

## 2022-11-08 DIAGNOSIS — Z91199 Patient's noncompliance with other medical treatment and regimen due to unspecified reason: Secondary | ICD-10-CM

## 2022-11-08 DIAGNOSIS — Z6820 Body mass index (BMI) 20.0-20.9, adult: Secondary | ICD-10-CM

## 2022-11-08 LAB — LACTATE DEHYDROGENASE: LDH: 187 U/L (ref 98–192)

## 2022-11-08 LAB — FOLATE: Folate: 3.2 ng/mL — ABNORMAL LOW (ref >5.9)

## 2022-11-08 LAB — HEPATITIS PANEL, ACUTE
HCV Ab: NONREACTIVE
Hep A IgM: NONREACTIVE
Hep B C IgM: NONREACTIVE
Hepatitis B Surface Ag: NONREACTIVE

## 2022-11-08 LAB — COMPREHENSIVE METABOLIC PANEL
ALT: 32 U/L (ref 0–44)
ALT: 26 U/L (ref 0–44)
AST: 122 U/L — ABNORMAL HIGH (ref 15–41)
AST: 96 U/L — ABNORMAL HIGH (ref 15–41)
Albumin: 2.6 g/dL — ABNORMAL LOW (ref 3.5–5.0)
Albumin: 2.1 g/dL — ABNORMAL LOW (ref 3.5–5.0)
Alkaline Phosphatase: 102 U/L (ref 38–126)
Alkaline Phosphatase: 84 U/L (ref 38–126)
Anion gap: 15 (ref 5–15)
Anion gap: 11 (ref 5–15)
BUN: <5 mg/dL — ABNORMAL LOW (ref 6–20)
BUN: 7 mg/dL (ref 6–20)
CO2: 23 mmol/L (ref 22–32)
CO2: 22 mmol/L (ref 22–32)
Calcium: 7.7 mg/dL — ABNORMAL LOW (ref 8.9–10.3)
Calcium: 7.5 mg/dL — ABNORMAL LOW (ref 8.9–10.3)
Chloride: 88 mmol/L — ABNORMAL LOW (ref 98–111)
Chloride: 93 mmol/L — ABNORMAL LOW (ref 98–111)
Creatinine, Ser: 1.06 mg/dL — ABNORMAL HIGH (ref 0.44–1.00)
Creatinine, Ser: 1.06 mg/dL — ABNORMAL HIGH (ref 0.44–1.00)
GFR, Estimated: >60 mL/min (ref >60)
GFR, Estimated: >60 mL/min (ref >60)
Glucose, Bld: 99 mg/dL (ref 70–99)
Glucose, Bld: 95 mg/dL (ref 70–99)
Potassium: 3.2 mmol/L — ABNORMAL LOW (ref 3.5–5.1)
Potassium: 3.7 mmol/L (ref 3.5–5.1)
Sodium: 126 mmol/L — ABNORMAL LOW (ref 135–145)
Sodium: 126 mmol/L — ABNORMAL LOW (ref 135–145)
Total Bilirubin: 5.7 mg/dL — ABNORMAL HIGH (ref <1.2)
Total Bilirubin: 6.7 mg/dL — ABNORMAL HIGH (ref <1.2)
Total Protein: 7.6 g/dL (ref 6.5–8.1)
Total Protein: 6.4 g/dL — ABNORMAL LOW (ref 6.5–8.1)

## 2022-11-08 LAB — PROCALCITONIN: Procalcitonin: 0.19 ng/mL

## 2022-11-08 LAB — RETICULOCYTES
Immature Retic Fract: 23.9 % — ABNORMAL HIGH (ref 2.3–15.9)
RBC.: 1.32 MIL/uL — ABNORMAL LOW (ref 3.87–5.11)
Retic Count, Absolute: 55 10*3/uL (ref 19.0–186.0)
Retic Ct Pct: 4.2 % — ABNORMAL HIGH (ref 0.4–3.1)

## 2022-11-08 LAB — IRON AND TIBC
Iron: 119 ug/dL (ref 28–170)
Saturation Ratios: 100 % — ABNORMAL HIGH (ref 10.4–31.8)
TIBC: 119 ug/dL — ABNORMAL LOW (ref 250–450)
UIBC: 0 ug/dL

## 2022-11-08 LAB — PROTIME-INR
INR: 1.5 — ABNORMAL HIGH (ref 0.8–1.2)
Prothrombin Time: 18.6 s — ABNORMAL HIGH (ref 11.4–15.2)

## 2022-11-08 LAB — CBC WITH DIFFERENTIAL/PLATELET
Abs Immature Granulocytes: 0.04 10*3/uL (ref 0.00–0.07)
Basophils Absolute: 0 10*3/uL (ref 0.0–0.1)
Basophils Relative: 1 %
Eosinophils Absolute: 0 10*3/uL (ref 0.0–0.5)
Eosinophils Relative: 0 %
HCT: 19.7 % — ABNORMAL LOW (ref 36.0–46.0)
Hemoglobin: 6.6 g/dL — CL (ref 12.0–15.0)
Immature Granulocytes: 1 %
Lymphocytes Relative: 18 %
Lymphs Abs: 1.1 10*3/uL (ref 0.7–4.0)
MCH: 33.2 pg (ref 26.0–34.0)
MCHC: 33.5 g/dL (ref 30.0–36.0)
MCV: 99 fL (ref 80.0–100.0)
Monocytes Absolute: 0.6 10*3/uL (ref 0.1–1.0)
Monocytes Relative: 10 %
Neutro Abs: 4.5 10*3/uL (ref 1.7–7.7)
Neutrophils Relative %: 70 %
Platelets: 115 10*3/uL — ABNORMAL LOW (ref 150–400)
RBC: 1.99 MIL/uL — ABNORMAL LOW (ref 3.87–5.11)
RDW: 20.4 % — ABNORMAL HIGH (ref 11.5–15.5)
WBC: 6.3 10*3/uL (ref 4.0–10.5)
nRBC: 0 % (ref 0.0–0.2)

## 2022-11-08 LAB — HIV ANTIBODY (ROUTINE TESTING W REFLEX): HIV Screen 4th Generation wRfx: NONREACTIVE

## 2022-11-08 LAB — ETHANOL: Alcohol, Ethyl (B): <10 mg/dL (ref <10)

## 2022-11-08 LAB — CBC
HCT: 17.2 % — ABNORMAL LOW (ref 36.0–46.0)
Hemoglobin: 5.8 g/dL — CL (ref 12.0–15.0)
MCH: 36.5 pg — ABNORMAL HIGH (ref 26.0–34.0)
MCHC: 33.7 g/dL (ref 30.0–36.0)
MCV: 108.2 fL — ABNORMAL HIGH (ref 80.0–100.0)
Platelets: 140 10*3/uL — ABNORMAL LOW (ref 150–400)
RBC: 1.59 MIL/uL — ABNORMAL LOW (ref 3.87–5.11)
RDW: 15.3 % (ref 11.5–15.5)
WBC: 7.1 10*3/uL (ref 4.0–10.5)
nRBC: 0 % (ref 0.0–0.2)

## 2022-11-08 LAB — POC OCCULT BLOOD, ED: Fecal Occult Bld: POSITIVE — AB

## 2022-11-08 LAB — SEDIMENTATION RATE: Sed Rate: 65 mm/h — ABNORMAL HIGH (ref 0–22)

## 2022-11-08 LAB — MAGNESIUM: Magnesium: 0.9 mg/dL — CL (ref 1.7–2.4)

## 2022-11-08 LAB — CK: Total CK: 48 U/L (ref 38–234)

## 2022-11-08 LAB — FERRITIN: Ferritin: 448 ng/mL — ABNORMAL HIGH (ref 11–307)

## 2022-11-08 LAB — T4, FREE: Free T4: 1.18 ng/dL — ABNORMAL HIGH (ref 0.61–1.12)

## 2022-11-08 LAB — AMMONIA: Ammonia: 38 umol/L — ABNORMAL HIGH (ref 9–35)

## 2022-11-08 LAB — PHOSPHORUS: Phosphorus: 3.9 mg/dL (ref 2.5–4.6)

## 2022-11-08 LAB — OSMOLALITY: Osmolality: 267 mosm/kg — ABNORMAL LOW (ref 275–295)

## 2022-11-08 LAB — PREGNANCY, URINE: Preg Test, Ur: NEGATIVE

## 2022-11-08 LAB — TSH: TSH: 12.981 u[IU]/mL — ABNORMAL HIGH (ref 0.350–4.500)

## 2022-11-08 LAB — LIPASE, BLOOD: Lipase: 53 U/L — ABNORMAL HIGH (ref 11–51)

## 2022-11-08 LAB — ACETAMINOPHEN LEVEL: Acetaminophen (Tylenol), Serum: <10 ug/mL — ABNORMAL LOW (ref 10–30)

## 2022-11-08 LAB — VITAMIN B12: Vitamin B-12: 653 pg/mL (ref 180–914)

## 2022-11-08 LAB — PREPARE RBC (CROSSMATCH)

## 2022-11-08 LAB — C-REACTIVE PROTEIN: CRP: 4.3 mg/dL — ABNORMAL HIGH (ref <1.0)

## 2022-11-08 MED ORDER — ONDANSETRON HCL 4 MG/2ML IJ SOLN: 4.0000 mg | Freq: Four times a day (QID) | INTRAMUSCULAR | Status: DC | PRN

## 2022-11-08 MED ORDER — FOLIC ACID 1 MG PO TABS: 1.0000 mg | ORAL_TABLET | Freq: Every day | ORAL | Status: DC

## 2022-11-08 MED ORDER — CALCIUM GLUCONATE-NACL 1-0.675 GM/50ML-% IV SOLN
1.0000 g | Freq: Once | INTRAVENOUS | Status: AC
  Administered 2022-11-08: 1000 mg via INTRAVENOUS
  Filled 2022-11-08: qty 50

## 2022-11-08 MED ORDER — SODIUM CHLORIDE 0.9 % IV SOLN: INTRAVENOUS | Status: AC

## 2022-11-08 MED ORDER — IOHEXOL 350 MG/ML SOLN
75.0000 mL | Freq: Once | INTRAVENOUS | Status: AC | PRN
  Administered 2022-11-08: 75 mL via INTRAVENOUS

## 2022-11-08 MED ORDER — LORAZEPAM 2 MG/ML IJ SOLN: 1.0000 mg | INTRAMUSCULAR | Status: AC | PRN

## 2022-11-08 MED ORDER — PIPERACILLIN-TAZOBACTAM 3.375 G IVPB
3.3750 g | Freq: Three times a day (TID) | INTRAVENOUS | Status: DC
  Administered 2022-11-09 – 2022-11-10 (×4): 3.375 g via INTRAVENOUS
  Filled 2022-11-08 (×4): qty 50

## 2022-11-08 MED ORDER — FENTANYL CITRATE PF 50 MCG/ML IJ SOSY: 12.5000 ug | PREFILLED_SYRINGE | INTRAMUSCULAR | Status: DC | PRN

## 2022-11-08 MED ORDER — ONDANSETRON HCL 4 MG PO TABS: 4.0000 mg | ORAL_TABLET | Freq: Four times a day (QID) | ORAL | Status: DC | PRN

## 2022-11-08 MED ORDER — POTASSIUM CHLORIDE CRYS ER 20 MEQ PO TBCR
40.0000 meq | EXTENDED_RELEASE_TABLET | Freq: Once | ORAL | Status: AC
  Administered 2022-11-08: 40 meq via ORAL
  Filled 2022-11-08: qty 2

## 2022-11-08 MED ORDER — THIAMINE HCL 100 MG/ML IJ SOLN
100.0000 mg | Freq: Every day | INTRAMUSCULAR | Status: DC
  Filled 2022-11-08: qty 2

## 2022-11-08 MED ORDER — THIAMINE HCL 100 MG/ML IJ SOLN: 100.0000 mg | Freq: Every day | INTRAMUSCULAR | Status: DC

## 2022-11-08 MED ORDER — ADULT MULTIVITAMIN W/MINERALS CH
1.0000 | ORAL_TABLET | Freq: Every day | ORAL | Status: DC
  Administered 2022-11-08 – 2022-11-13 (×6): 1 via ORAL
  Filled 2022-11-08 (×7): qty 1

## 2022-11-08 MED ORDER — SODIUM CHLORIDE 0.9% IV SOLUTION: Freq: Once | INTRAVENOUS | Status: AC

## 2022-11-08 MED ORDER — PIPERACILLIN-TAZOBACTAM 3.375 G IVPB 30 MIN
3.3750 g | Freq: Once | INTRAVENOUS | Status: AC
  Administered 2022-11-08: 3.375 g via INTRAVENOUS
  Filled 2022-11-08: qty 50

## 2022-11-08 MED ORDER — ALBUMIN HUMAN 25 % IV SOLN
12.5000 g | Freq: Once | INTRAVENOUS | Status: AC
  Administered 2022-11-09: 12.5 g via INTRAVENOUS
  Filled 2022-11-08: qty 250
  Filled 2022-11-08: qty 50

## 2022-11-08 MED ORDER — SODIUM CHLORIDE 0.9% FLUSH: 10.0000 mL | Freq: Two times a day (BID) | INTRAVENOUS | Status: DC

## 2022-11-08 MED ORDER — FOLIC ACID 1 MG PO TABS
1.0000 mg | ORAL_TABLET | Freq: Every day | ORAL | Status: DC
  Administered 2022-11-08 – 2022-11-13 (×6): 1 mg via ORAL
  Filled 2022-11-08 (×7): qty 1

## 2022-11-08 MED ORDER — LORAZEPAM 1 MG PO TABS
1.0000 mg | ORAL_TABLET | ORAL | Status: AC | PRN
  Administered 2022-11-09 – 2022-11-11 (×9): 1 mg via ORAL
  Filled 2022-11-08 (×3): qty 1
  Filled 2022-11-08: qty 2
  Filled 2022-11-08 (×6): qty 1

## 2022-11-08 MED ORDER — LEVOTHYROXINE SODIUM 100 MCG PO TABS
100.0000 ug | ORAL_TABLET | Freq: Every day | ORAL | Status: DC
  Administered 2022-11-09 – 2022-11-14 (×6): 100 ug via ORAL
  Filled 2022-11-08 (×6): qty 1

## 2022-11-08 MED ORDER — THIAMINE MONONITRATE 100 MG PO TABS
100.0000 mg | ORAL_TABLET | Freq: Every day | ORAL | Status: DC
Start: 2022-11-08 — End: 2022-11-14
  Administered 2022-11-08 – 2022-11-13 (×6): 100 mg via ORAL
  Filled 2022-11-08 (×7): qty 1

## 2022-11-08 NOTE — Subjective & Objective (Addendum)
Pt with weight loss since MAy 2024 with acites reports epigastric pain, reflux, early satiety, decreased appetite  Liver imaging showed liver disease, as well as borderline splenomegaly. Portal system is patent.plan was for  transjugular liver biopsy by Dr. Loreta Ave in IR  Found to have Hg of 5.8 was sent to [facility] ER to get 2 units of blood. Patient was also found to be jaundiced. She has been having ascites for almost a month and had 1 paracentesis in the past. Never been seen by GI plan to have workup done by OB/GYN He she does drink alcohol on a daily basis no history of alcohol withdrawal no melena or bloody stools no fever

## 2022-11-08 NOTE — ED Provider Notes (Signed)
3:34 PM Care seen for Dr. Theresia Lo.  Time of transfer of care, patient is receiving 2 units of blood and waiting on results of CT abdomen pelvis.  Plan of care will be to try to convince patient to be admitted for further management of GI bleed needing blood transfusion in the setting of worsening liver function.  6:39 PM CT scan returned showing evidence of colitis in the ascending descending and sigmoid colon.  Patient is having abdominal pain.  Patient completed the blood transfusion and we had a discussion.  Due to the patient's GI bleeding, abdominal pain, liver dysfunction with new elevated bilirubin of 5.7, and intra-abdominal infection, do not feel she is safe for discharge home.  Feel she needs admission for IV antibiotics, watching for needing more blood transfusion, and likely GI evaluation tomorrow to discuss further management.       Nafeesa Dils, Canary Brim, MD 11/08/22 2352

## 2022-11-08 NOTE — H&P (Addendum)
Marisa Contreras VHQ:[id number] DOB: [birth year] DOA: 11/08/2022     PCP: Bradd Canary, MD   Outpatient Specialists: Nelva Bush Morris  Patient arrived to ER on 11/08/22 at 1136 Referred by Attending Tegeler, Canary Brim, *   Patient coming from:    home Lives   With family    Chief Complaint:   Chief Complaint  Patient presents with   Jaundice   Anemia    HPI: Marisa Contreras is a 47 y.o. female with medical history significant of hypothyroidism    Presented with abnormal results  Pt with weight loss since MAy 2024 with acites reports epigastric pain, reflux, early satiety, decreased appetite  Liver imaging showed liver disease, as well as borderline splenomegaly. Portal system is patent.plan was for  transjugular liver biopsy by Dr. Loreta Ave in IR  Found to have Hg of 5.8 was sent to [facility] ER to get 2 units of blood. Patient was also found to be jaundiced. She has been having ascites for almost a month and had 1 paracentesis in the past. Never been seen by GI plan to have workup done by OB/GYN He she does drink alcohol on a daily basis no history of alcohol withdrawal no melena or bloody stools no fever    Paracentesis on October 4th she is not sure who did it  Drinks 1-2 glasses of wine a day has been for years No hx of alcohol withdrawal  She has been told in 2013 taht she has abnormal liver functions and in 2020 But has not had any follow up   Reports mild cough and blood in urine     significant ETOH intake   Does not smoke   Lab Results  Component Value Date   SARSCOV2NAA Not Detected 02/17/2019      Regarding pertinent Chronic problems:      HTN no longer on Losartan   - last echo  Recent Results (from the past 41324 hour(s))  ECHOCARDIOGRAM COMPLETE   Collection Time: 08/13/18 10:28 AM   Narrative     ECHOCARDIOGRAM REPORT     Sonographer Comments: Respiratory motion. IMPRESSIONS    1. The left ventricle has normal systolic function with  an ejection fraction of 60-65%. The cavity size was normal. Left ventricular diastolic parameters were normal.  2. The right ventricle has normal systolic function. The cavity was normal. There is no increase in right ventricular wall thickness.  3. Mild thickening of the mitral valve leaflet.  4. The aortic valve is tricuspid.  5. The aorta is normal unless otherwise noted.         Hypothyroidism:   Lab Results  Component Value Date   TSH 1.15 09/17/2018   on synthroid     Liver disease MELD 3.0: 26 at 11/08/2022  7:58 AM MELD-Na: 26 at 11/08/2022  7:58 AM Calculated from: Serum Creatinine: 1.06 mg/dL at 40/01/270  5:36 AM Serum Sodium: 126 mmol/L at 11/08/2022  7:58 AM Total Bilirubin: 5.7 mg/dL at 64/04/345  4:25 AM Serum Albumin: 2.6 g/dL at 95/06/3873  6:43 AM INR(ratio): 1.5 at 11/08/2022  7:58 AM Age at listing (hypothetical): 46 years Sex: Female at 11/08/2022  7:58 AM  Hepatic Function Panel     Component Value Date/Time   PROT 7.6 11/08/2022 0758   ALBUMIN 2.6 (L) 11/08/2022 0758   AST 122 (H) 11/08/2022 0758   ALT 32 11/08/2022 0758   ALKPHOS 102 11/08/2022 0758   BILITOT 5.7 (H) 11/08/2022 0758   BILIDIR 0.0  12/24/2012 0847   1.5    Iron/TIBC/Ferritin/ %Sat    Component Value Date/Time   IRON 119 11/08/2022 1207   TIBC 119 (L) 11/08/2022 1207   FERRITIN 448 (H) 11/08/2022 1207   IRONPCTSAT 100 (H) 11/08/2022 1207      While in ER: Clinical Course as of 11/08/22 1936  Thu Nov 08, 2022  1148 On 9/25 bili 1.8, hgb 8.2 [VK]  1342 Hemoccult positive, CT is pending. [VK]  1539 Patient signed out to Dr. Rush Landmark pending CT read with plan for likely admission. [VK]    Clinical Course User Index [VK] Rexford Maus, DO     Patient started on 2 units RBCs CT scan showed evidence of colitis in ascending descending sigmoid colon Total bili 5.7   Lab Orders         Vitamin B12         Folate         Iron and TIBC         Ferritin         Reticulocytes          Hepatitis panel, acute         Lipase, blood         Ethanol         Haptoglobin         Lactate dehydrogenase         CBC with Differential         POC occult blood, ED       CXR - ordered  CTabd/pelvis - Wall thickening of ascending, descending and sigmoid colon is noted concerning for infectious or inflammatory colitis. Mildly heterogeneous appearance of hepatic parenchyma is noted most likely related to cirrhosis, but underlying metastatic disease cannot be excluded. MRI may be considered for further evaluation.   Findings consistent with hepatic cirrhosis with mild splenomegaly and mild ascites.    Following Medications were ordered in ER: Medications  0.9 %  sodium chloride infusion (Manually program via Guardrails IV Fluids) (0 mLs Intravenous Stopped 11/08/22 1448)  potassium chloride SA (KLOR-CON M) CR tablet 40 mEq (40 mEq Oral Given 11/08/22 1225)  calcium gluconate 1 g/ 50 mL sodium chloride IVPB (0 mg Intravenous Stopped 11/08/22 1447)  iohexol (OMNIPAQUE) 350 MG/ML injection 75 mL (75 mLs Intravenous Contrast Given 11/08/22 1350)  piperacillin-tazobactam (ZOSYN) IVPB 3.375 g (3.375 g Intravenous New Bag/Given 11/08/22 1852)    _______________________________________________________ ER Provider Called:     [facility]  Dr. Chales Abrahams  They Recommend admit to medicine   Will see in AM       ED Triage Vitals  Encounter Vitals Group     BP 11/08/22 1141 100/71     Systolic BP Percentile --      Diastolic BP Percentile --      Pulse Rate 11/08/22 1141 93     Resp 11/08/22 1141 13     Temp 11/08/22 1141 98.2 F (36.8 C)     Temp Source 11/08/22 1141 Oral     SpO2 11/08/22 1141 100 %     Weight 11/08/22 1146 127 lb 13.9 oz (58 kg)     Height 11/08/22 1146 5\' 7"  (1.702 m)     Head Circumference --      Peak Flow --      Pain Score 11/08/22 1144 0     Pain Loc --      Pain Education --      Exclude from Growth Chart --  ONGE(95)@      _________________________________________ Significant initial  Findings: Abnormal Labs Reviewed  FOLATE - Abnormal; Notable for the following components:      Result Value   Folate 3.2 (*)    All other components within normal limits  IRON AND TIBC - Abnormal; Notable for the following components:   TIBC 119 (*)    Saturation Ratios 100 (*)    All other components within normal limits  FERRITIN - Abnormal; Notable for the following components:   Ferritin 448 (*)    All other components within normal limits  RETICULOCYTES - Abnormal; Notable for the following components:   Retic Ct Pct 4.2 (*)    RBC. 1.32 (*)    Immature Retic Fract 23.9 (*)    All other components within normal limits  LIPASE, BLOOD - Abnormal; Notable for the following components:   Lipase 53 (*)    All other components within normal limits  POC OCCULT BLOOD, ED - Abnormal; Notable for the following components:   Fecal Occult Bld POSITIVE (*)    All other components within normal limits        ECG: Ordered Personally reviewed and interpreted by me showing: HR : 90 Rhythm: NSR,    nonspecific changes,  QTC 532    Recent Labs    11/08/22 1207  FERRITIN 448*  LDH 187    Lab Results  Component Value Date   SARSCOV2NAA Not Detected 02/17/2019    ____________________   The recent clinical data is shown below. Vitals:   11/08/22 1630 11/08/22 1726 11/08/22 1830 11/08/22 1845  BP: 111/74 114/78 113/75 114/76  Pulse: 90 85 93 96  Resp: 17 (!) 21 17 18   Temp:  (!) 97.5 F (36.4 C)    TempSrc:  Axillary    SpO2: 100% 100% 98% 98%  Weight:      Height:        WBC     Component Value Date/Time   WBC 7.1 11/08/2022 0758   LYMPHSABS 1.8 08/02/2015 1254   MONOABS 0.4 08/02/2015 1254   EOSABS 0.0 08/02/2015 1254   BASOSABS 0.0 08/02/2015 1254             UA ordered   ABX started Antibiotics Given (last 72 hours)     Date/Time Action Medication Dose Rate   11/08/22 1852 New Bag/Given    piperacillin-tazobactam (ZOSYN) IVPB 3.375 g 3.375 g 100 mL/hr       __________________________________________________________ Recent Labs  Lab 11/08/22 0758  NA 126*  K 3.2*  CO2 23  GLUCOSE 99  BUN <5*  CREATININE 1.06*  CALCIUM 7.7*    Cr   Up from baseline see below Lab Results  Component Value Date   CREATININE 1.06 (H) 11/08/2022   CREATININE 0.77 09/17/2018   CREATININE 0.90 08/20/2018    Recent Labs  Lab 11/08/22 0758  AST 122*  ALT 32  ALKPHOS 102  BILITOT 5.7*  PROT 7.6  ALBUMIN 2.6*   Lab Results  Component Value Date   CALCIUM 7.7 (L) 11/08/2022   PHOS 2.8 12/24/2012    Plt: Lab Results  Component Value Date   PLT 140 (L) 11/08/2022    Recent Labs  Lab 11/08/22 0758  WBC 7.1  HGB 5.8*  HCT 17.2*  MCV 108.2*  PLT 140*    HG/HCT  Down      Component Value Date/Time   HGB 5.8 (LL) 11/08/2022 0758   HCT 17.2 (L) 11/08/2022 2841  MCV 108.2 (H) 11/08/2022 0758     Recent Labs  Lab 11/08/22 1207  LIPASE 53*   No results for input(s): "AMMONIA" in the last 168 hours.   _______________________________________ Hospitalist was called for admission for   Anemia,  Gastrointestinal hemorrhage, unspecified gastrointestinal hemorrhage type, Jaundice, Colitis    The following Work up has been ordered so far:  Orders Placed This Encounter  Procedures   Critical Care   CT ABDOMEN PELVIS W CONTRAST   Vitamin B12   Folate   Iron and TIBC   Ferritin   Reticulocytes   Hepatitis panel, acute   Lipase, blood   Ethanol   Haptoglobin   Lactate dehydrogenase   CBC with Differential   Informed Consent Details: Physician/Practitioner Attestation; Transcribe to consent form and obtain patient signature   Consult to hospitalist   POC occult blood, ED   Prepare RBC (crossmatch)   Insert peripheral IV     OTHER Significant initial  Findings:  labs showing:     DM  labs:  HbA1C: No results for input(s): "HGBA1C" in the last 8760  hours.     CBG (last 3)  No results for input(s): "GLUCAP" in the last 72 hours.        Cultures: No results found for: "SDES", "SPECREQUEST", "CULT", "REPTSTATUS"   Radiological Exams on Admission: CT ABDOMEN PELVIS W CONTRAST  Result Date: 11/08/2022 CLINICAL DATA:  Acute generalized abdominal pain, jaundice. EXAM: CT ABDOMEN AND PELVIS WITH CONTRAST TECHNIQUE: Multidetector CT imaging of the abdomen and pelvis was performed using the standard protocol following bolus administration of intravenous contrast. RADIATION DOSE REDUCTION: This exam was performed according to the departmental dose-optimization program which includes automated exposure control, adjustment of the mA and/or kV according to patient size and/or use of iterative reconstruction technique. CONTRAST:  75mL OMNIPAQUE IOHEXOL 350 MG/ML SOLN COMPARISON:  None Available. FINDINGS: Lower chest: Minimal bibasilar subsegmental atelectasis. Hepatobiliary: Minimal cholelithiasis. No biliary dilatation. Minimal nodular hepatic contours are noted suggesting hepatic cirrhosis. Slightly heterogeneous hepatic parenchyma is noted, and underlying metastatic disease cannot be excluded. Pancreas: Unremarkable. No pancreatic ductal dilatation or surrounding inflammatory changes. Spleen: Mild splenomegaly is noted. Adrenals/Urinary Tract: Adrenal glands are unremarkable. Kidneys are normal, without renal calculi, focal lesion, or hydronephrosis. Bladder is unremarkable. Stomach/Bowel: Stomach is unremarkable. No abnormal bowel dilatation is noted. Wall thickening of cecum is noted as well as descending and sigmoid colon concerning for infectious or inflammatory colitis. Vascular/Lymphatic: No significant vascular findings are present. No enlarged abdominal or pelvic lymph nodes. Reproductive: Uterus and bilateral adnexa are unremarkable. Other: Mild ascites is noted. Musculoskeletal: No acute or significant osseous findings. IMPRESSION: Wall thickening  of ascending, descending and sigmoid colon is noted concerning for infectious or inflammatory colitis. Mildly heterogeneous appearance of hepatic parenchyma is noted most likely related to cirrhosis, but underlying metastatic disease cannot be excluded. MRI may be considered for further evaluation. Findings consistent with hepatic cirrhosis with mild splenomegaly and mild ascites. Minimal cholelithiasis. Electronically Signed   By: Lupita Raider M.D.   On: 11/08/2022 15:58   _______________________________________________________________________________________________________ Latest  Blood pressure 114/76, pulse 96, temperature (!) 97.5 F (36.4 C), temperature source Axillary, resp. rate 18, height 5\' 7"  (1.702 m), weight 58 kg, SpO2 98%.   Vitals  labs and radiology finding personally reviewed  Review of Systems:    Pertinent positives include:   jaundice  Constitutional:  No weight loss, night sweats, Fevers, chills, fatigue, weight loss  HEENT:  No headaches, Difficulty  swallowing,Tooth/dental problems,Sore throat,  No sneezing, itching, ear ache, nasal congestion, post nasal drip,  Cardio-vascular:  No chest pain, Orthopnea, PND, anasarca, dizziness, palpitations.no Bilateral lower extremity swelling  GI:  No heartburn, indigestion, abdominal pain, nausea, vomiting, diarrhea, change in bowel habits, loss of appetite, melena, blood in stool, hematemesis Resp:  no shortness of breath at rest. No dyspnea on exertion, No excess mucus, no productive cough, No non-productive cough, No coughing up of blood.No change in color of mucus.No wheezing. Skin:  no rash or lesions. No GU:  no dysuria, change in color of urine, no urgency or frequency. No straining to urinate.  No flank pain.  Musculoskeletal:  No joint pain or no joint swelling. No decreased range of motion. No back pain.  Psych:  No change in mood or affect. No depression or anxiety. No memory loss.  Neuro: no localizing  neurological complaints, no tingling, no weakness, no double vision, no gait abnormality, no slurred speech, no confusion  All systems reviewed and apart from HOPI all are negative _______________________________________________________________________________________________ Past Medical History:   Past Medical History:  Diagnosis Date   Acute bronchitis 04/28/2015   Chest pain at rest 08/02/2015   Chicken pox 09/08/2010   Elevated liver function tests 04/16/2011   Headache(784.0) 12/11/2010   History of chicken pox 09/08/2010   Hypertension    Hypothyroidism    Neck pain 01/20/2012   Neck pain, acute 12/11/2010   Pleurisy 04/28/2015   Preventative health care 12/11/2010   Had Tetanus in 2007 per patient    Second degree burn of arm 09/12/2010   Shoulder pain, right 12/11/2010   Sinusitis 01/07/2012   Thyroid disease    Vaginal Pap smear, abnormal    Visit for gynecologic examination 12/11/2010    Past Surgical History:  Procedure Laterality Date   GYNECOLOGIC CRYOSURGERY     IR PARACENTESIS  10/10/2022   IR RADIOLOGIST EVAL & MGMT  10/05/2022   IR RADIOLOGIST EVAL & MGMT  10/24/2022   TRUNK SKIN LESION EXCISIONAL BIOPSY     abd, benign    Social History:   Ambulatory   independently     reports that she quit smoking about 16 years ago. Her smoking use included cigarettes. She has never used smokeless tobacco. She reports that she does not drink alcohol and does not use drugs.   Family History:  Family History  Problem Relation Age of Onset   Hypertension Father    Diabetes Father    Thyroid disease Maternal Grandmother    Hyperlipidemia Maternal Grandmother    Heart disease Maternal Grandfather    Alcohol abuse Paternal Grandmother    Heart attack Paternal Grandfather    Heart disease Paternal Grandfather    ______________________________________________________________________________________________ Allergies: No Known Allergies   Prior to Admission medications    Medication Sig Start Date End Date Taking? Authorizing Provider  levothyroxine (SYNTHROID) 100 MCG tablet Take 100 mcg by mouth daily before breakfast.   Yes [provider]    ___________________________________________________________________________________________________ Physical Exam:    11/08/2022    6:45 PM 11/08/2022    6:30 PM 11/08/2022    5:26 PM  Vitals with BMI  Systolic 114 113 811  Diastolic 76 75 78  Pulse 96 93 85     1. General:  in No  Acute distress    Chronically ill -appearing 2. Psychological: Alert and  Oriented 3. Head/ENT:    Dry Mucous Membranes  Head Non traumatic, neck supple                          Poor Dentition 4. SKIN: decreased Skin turgor,  Skin clean Dry and intact no rash    5. Heart: Regular rate and rhythm no  Murmur, no Rub or gallop 6. Lungs:  no wheezes or crackles   7. Abdomen: Soft,  non-tender, distended  bowel sounds present 8. Lower extremities: no clubbing, cyanosis, 2+edema 9. Neurologically Grossly intact, moving all 4 extremities equally  no   asterixis 10. MSK: Normal range of motion    Chart has been reviewed  ______________________________________________________________________________________________  Assessment/Plan 46 y.o. female with medical history significant of hypothyroidism  Admitted for   Anemia,  Gastrointestinal hemorrhage,  Jaundice, Colitis    Present on Admission:  Colitis  HTN (hypertension), benign  Hypothyroidism  Cirrhosis of liver (HCC)  Symptomatic anemia  Folate deficiency  Hyponatremia  Hypokalemia  Alcoholic cirrhosis of liver with ascites (HCC)  Hypomagnesemia     Colitis Continue zosyn appreciate GI consult  HTN (hypertension), benign Allow permissive HTN  Hypothyroidism - Check TSH continue home medications Synthroid at  100 mcg po q day Need to clarify if pt has been compliant may need to go up on the dose   Cirrhosis of liver  (HCC) Will need work up for cause Appreciate GI consult  Hepatitis serologies non reactive The patient does drink alcohol although denies heavy drinking Has history of elevated LFTs in the past seems due to dating back to 2013 Check ANA sed rate CRP Lipid panel MELD score 26 which is very worrisome     Symptomatic anemia Transfuse 2 units and follow CBC Low folate appears to be anemia of chronic disease but patient is also Hemoccult positive suspecting may have colitis contributing Appreciate GI consult   Folate deficiency Will replace  Hyponatremia In the setting of liver disease Obtain urine electrolytes check TSH check chest x-ray  Hypokalemia - will replace electrolytes and repeat  check Mg, phos and Ca level and replace as needed Monitor on telemetry   Lab Results  Component Value Date   K 3.2 (L) 11/08/2022     Lab Results  Component Value Date   CREATININE 1.06 (H) 11/08/2022   No results found for: "MG" Lab Results  Component Value Date   CALCIUM 7.7 (L) 11/08/2022   PHOS 2.8 12/24/2012     Alcoholic cirrhosis of liver with ascites (HCC) Appreciate GI consult IR consult for possible paracentesis Given mild AKI may benefit from Albumin administration Once euvolemic would benefit from Lasix spironolactone titration and monitoring of renal function    Hypomagnesemia Will replace - will replace electrolytes and repeat  check Mg, phos and Ca level and replace as needed Monitor on telemetry Also will replace calcium   Lab Results  Component Value Date   K 3.7 11/08/2022     Lab Results  Component Value Date   CREATININE 1.06 (H) 11/08/2022   Lab Results  Component Value Date   MG 0.9 (LL) 11/08/2022   Lab Results  Component Value Date   CALCIUM 7.5 (L) 11/08/2022   PHOS 3.9 11/08/2022     Other plan as per orders.  DVT prophylaxis:  SCD    Code Status:    Code Status: Prior FULL CODE   as per patient   I had personally discussed  CODE STATUS with patient   ACP   none  Family Communication:   Family not at  Bedside    Diet npo post midnight   Disposition Plan:      To home once workup is complete and patient is stable   Following barriers for discharge:                                                      Electrolytes corrected                               Anemia corrected h/H stable                             PO antibiotics                             Will need to be able to tolerate PO                                                        Will need consultants to evaluate patient prior to discharge       Consult Orders  (From admission, onward)           Start     Ordered   11/09/22 2000  Nutritional services consult  Once       Provider:  (Not yet assigned)  Question:  Reason for Consult?  Answer:  assess nutrtional status   11/08/22 2001   11/08/22 1837  Consult to hospitalist  Paged by autumn  Once       Provider:  (Not yet assigned)  Question Answer Comment  Place call to: [facility]   Reason for Consult Admit      11/08/22 1839                               Would benefit from PT/OT eval prior to DC  Ordered                                      Transition of care consulted                   Nutrition    consulted                                       Consults called: [facility]  IR consult   Admission status:  ED Disposition     ED Disposition  Admit   Condition  --   Comment  Hospital Area: [facility] [[id number]]  Level of Care: Progressive [102]  Admit to Progressive based on following criteria: GI, ENDOCRINE disease patients with GI bleeding, acute liver failure or pancreatitis, stable with diabetic ketoacidosis or thyrotoxicosis (hypothyroid) state.  May admit patient to [facility] or [facility] if equivalent level  of care is available:: No  Covid Evaluation: Asymptomatic - no recent exposure (last 10 days) testing not required  Diagnosis:  Colitis [[id number]]  Admitting Physician: Therisa Doyne [[id number]]  Attending Physician: Therisa Doyne [[id number]]  Certification:: I certify there are rare and unusual circumstances requiring inpatient admission  Expected Medical Readiness: 11/11/2022           inpatient     I Expect 2 midnight stay secondary to severity of patient's current illness need for inpatient interventions justified by the following:    Severe lab/radiological/exam abnormalities including:   Cirrhosis and extensive comorbidities including:  substance abuse    liver disease   That are currently affecting medical management.   I expect  patient to be hospitalized for 2 midnights requiring inpatient medical care.  Patient is at high risk for adverse outcome (such as loss of life or disability) if not treated.  Indication for inpatient stay as follows:   Hemodynamic instability despite maximal medical therapy,    Need for operative/procedural  intervention    Need for IV antibiotics, IV fluids,   IV pain medications,      Level of care      progressive      tele indefinitely please discontinue once patient no longer qualifies COVID-19 Labs   Ndeye Tenorio 11/08/2022, 9:49 PM    [facility]     after 2 AM please page floor coverage PA If 7AM-7PM, please contact the day team taking care of the patient using [email or web address]

## 2022-11-08 NOTE — ED Triage Notes (Addendum)
Pt arrives via EMS from IR. Pt was about to have paracentesis, when blood work showed low hemoglobin of 5.8. Pt sent here for blood transfusion of 2 units. Pt awake, alert, appropriate. Jaundiced appearance.

## 2022-11-08 NOTE — Assessment & Plan Note (Addendum)
Transfuse 2 units and follow CBC Low folate appears to be anemia of chronic disease but patient is also Hemoccult positive suspecting may have colitis contributing Appreciate GI consult

## 2022-11-08 NOTE — Progress Notes (Signed)
Pt. To IR for scheduled procedure. Pt. Hgb critical at 5.8 - Dr. Loreta Ave notified in person. Verbal order for iStat with Hgb per Loreta Ave was 5.8. Dr. Loreta Ave notified of repeat Hgb in person. Verbal order for type and screen lab received and completed - see results.   Conversation with this RN, pt, and Dr. Loreta Ave at bedside regarding options to address critical Hgb. Pt. Elected to receive 2 units of pRBC in ED and be discharged today. Pt. Declined hospital admission at this time. Pt. Educated on risks, benefits, options by MD.   Pt. Transferred to ED [facility] by this RN with ED Charge notified by phone and in person. Pt. Husband at bedside and updated by this RN. Pt. Transported by stretcher with all belongings and IV x1. Call bell in reach, stretcher low and locked.

## 2022-11-08 NOTE — Assessment & Plan Note (Signed)
Continue zosyn appreciate GI consult

## 2022-11-08 NOTE — ED Provider Notes (Signed)
[place] EMERGENCY DEPARTMENT AT [facility] Provider Note   CSN: [id number] Arrival date & time: 11/08/22  1136     History  Chief Complaint  Patient presents with   Jaundice   Anemia    Marisa Contreras is a 47 y.o. female.  And is a 47 year old female with a past medical history of hypothyroidism and new onset ascites of unknown etiology presenting to the emergency department with anemia.  Patient states that she was scheduled for an outpatient paracentesis and liver biopsy today and when she had labs done this morning she was found to be newly anemic with a hemoglobin of 5.7 new jaundice and was recommended to come to the emergency department.  She states that she has had the ascites for about a month and has had 1 other paracentesis.  She states that she has not yet seen a GI doctor and is having workup done by her OB/GYN.  She states that she did have an outpatient vascular ultrasound of her liver that did not show any abnormalities.  She states that she does drink alcohol daily but denies any history of alcohol withdrawal or any drug use.  She denies any recent black or bloody stools, nausea or vomiting.  She states that she has diffuse abdominal discomfort.  Denies any fever.  The history is provided by the patient.       Home Medications Prior to Admission medications   Medication Sig Start Date End Date Taking? Authorizing Provider  levothyroxine (SYNTHROID) 100 MCG tablet Take 100 mcg by mouth daily before breakfast.   Yes [provider]      Allergies    Patient has no known allergies.    Review of Systems   Review of Systems  Physical Exam Updated Vital Signs BP 112/78   Pulse 88   Temp 97.8 F (36.6 C) (Oral)   Resp 20   Ht 5\' 7"  (1.702 m)   Wt 58 kg   LMP  (LMP Unknown)   SpO2 100%   BMI 20.03 kg/m  Physical Exam Vitals and nursing note reviewed.  Constitutional:      General: She is not in acute distress.    Appearance:  Normal appearance.  HENT:     Head: Normocephalic and atraumatic.     Nose: Nose normal.     Mouth/Throat:     Mouth: Mucous membranes are moist.     Pharynx: Oropharynx is clear.  Eyes:     General: Scleral icterus present.     Extraocular Movements: Extraocular movements intact.     Pupils: Pupils are equal, round, and reactive to light.  Cardiovascular:     Rate and Rhythm: Normal rate and regular rhythm.     Heart sounds: Normal heart sounds.  Pulmonary:     Effort: Pulmonary effort is normal.     Breath sounds: Normal breath sounds.  Abdominal:     General: There is distension (Palpable fluid wave).     Palpations: Abdomen is soft.     Tenderness: There is abdominal tenderness (Mild diffuse). There is no guarding or rebound.  Musculoskeletal:        General: Normal range of motion.     Cervical back: Normal range of motion.  Skin:    General: Skin is warm and dry.  Neurological:     General: No focal deficit present.     Mental Status: She is alert and oriented to person, place, and time.  Psychiatric:  Mood and Affect: Mood normal.        Behavior: Behavior normal.     ED Results / Procedures / Treatments   Labs (all labs ordered are listed, but only abnormal results are displayed) Labs Reviewed  FOLATE - Abnormal; Notable for the following components:      Result Value   Folate 3.2 (*)    All other components within normal limits  IRON AND TIBC - Abnormal; Notable for the following components:   TIBC 119 (*)    Saturation Ratios 100 (*)    All other components within normal limits  FERRITIN - Abnormal; Notable for the following components:   Ferritin 448 (*)    All other components within normal limits  RETICULOCYTES - Abnormal; Notable for the following components:   Retic Ct Pct 4.2 (*)    RBC. 1.32 (*)    Immature Retic Fract 23.9 (*)    All other components within normal limits  LIPASE, BLOOD - Abnormal; Notable for the following components:    Lipase 53 (*)    All other components within normal limits  POC OCCULT BLOOD, ED - Abnormal; Notable for the following components:   Fecal Occult Bld POSITIVE (*)    All other components within normal limits  VITAMIN B12  HEPATITIS PANEL, ACUTE  ETHANOL  LACTATE DEHYDROGENASE  HAPTOGLOBIN  PREPARE RBC (CROSSMATCH)    EKG None  Radiology No results found.  Procedures .Critical Care  Performed by: Rexford Maus, DO Authorized by: Rexford Maus, DO   Critical care provider statement:    Critical care time (minutes):  40   Critical care time was exclusive of:  Separately billable procedures and treating other patients   Critical care was necessary to treat or prevent imminent or life-threatening deterioration of the following conditions:  Circulatory failure and hepatic failure   Critical care was time spent personally by me on the following activities:  Development of treatment plan with patient or surrogate, discussions with consultants, examination of patient, evaluation of patient's response to treatment, obtaining history from patient or surrogate, ordering and performing treatments and interventions, ordering and review of laboratory studies, ordering and review of radiographic studies, pulse oximetry, re-evaluation of patient's condition and review of old charts   I assumed direction of critical care for this patient from another provider in my specialty: no       Medications Ordered in ED Medications  0.9 %  sodium chloride infusion (Manually program via Guardrails IV Fluids) (0 mLs Intravenous Stopped 11/08/22 1448)  potassium chloride SA (KLOR-CON M) CR tablet 40 mEq (40 mEq Oral Given 11/08/22 1225)  calcium gluconate 1 g/ 50 mL sodium chloride IVPB (0 mg Intravenous Stopped 11/08/22 1447)  iohexol (OMNIPAQUE) 350 MG/ML injection 75 mL (75 mLs Intravenous Contrast Given 11/08/22 1350)    ED Course/ Medical Decision Making/ A&P Clinical Course as of 11/08/22  1540  Thu Nov 08, 2022  1148 On 9/25 bili 1.8, hgb 8.2 [VK]  1342 Hemoccult positive, CT is pending. [VK]  1539 Patient signed out to Dr. Rush Landmark pending CT read with plan for likely admission. [VK]    Clinical Course User Index [VK] Rexford Maus, DO                                 Medical Decision Making This patient presents to the ED with chief complaint(s) of abnormal labs with pertinent past  medical history of hypothyroidism, new onset ascites of unknown etiology which further complicates the presenting complaint. The complaint involves an extensive differential diagnosis and also carries with it a high risk of complications and morbidity.    The differential diagnosis includes anemia, coagulopathy, GI bleed, liver failure, cirrhosis, obstructing biliary disease  Additional history obtained: Additional history obtained from family Records reviewed Care Everywhere/External Records  ED Course and Reassessment: On patient's arrival she is hemodynamically stable in no acute distress.  Did have labs drawn this morning at [facility] that showed a new hemoglobin of 5.7 from her baseline of 8 about 1 month ago.  She is agreeable for blood transfusion and 2 units of PRBCs will be ordered.  With new onset anemia, will require Hemoccult and will send anemia labs.  Labs also showed new onset elevated bilirubin of 5.7 from her baseline of 1.6 about a month ago.  Will have CT to evaluate for hepatobiliary pathology as a cause of her elevated bilirubin  Independent labs interpretation:  The following labs were independently interpreted: positive hemoccult, negative hepatitis panel  Independent visualization of imaging: - Pending     Amount and/or Complexity of Data Reviewed Labs: ordered. Radiology: ordered.  Risk Prescription drug management.          Final Clinical Impression(s) / ED Diagnoses Final diagnoses:  Anemia, unspecified type  Gastrointestinal hemorrhage,  unspecified gastrointestinal hemorrhage type  Jaundice    Rx / DC Orders ED Discharge Orders     None         Rexford Maus, DO 11/08/22 1540

## 2022-11-08 NOTE — ED Notes (Signed)
Order for repeat blood count 4 hours post transfusion placed per protocol.

## 2022-11-08 NOTE — Assessment & Plan Note (Addendum)
Will need work up for cause Appreciate GI consult  Hepatitis serologies non reactive The patient does drink alcohol although denies heavy drinking Has history of elevated LFTs in the past seems due to dating back to 2013 Check ANA sed rate CRP Lipid panel MELD score 26 which is very worrisome

## 2022-11-08 NOTE — Progress Notes (Signed)
ED Pharmacy Antibiotic Sign Off An antibiotic consult was received from an ED provider for zosyn per pharmacy dosing for intra-abdominal infection. A chart review was completed to assess appropriateness.   The following one time order(s) were placed:  Zosyn 3.375g IV x 1  Further antibiotic and/or antibiotic pharmacy consults should be ordered by the admitting provider if indicated.   Thank you for allowing pharmacy to be a part of this patient's care.   Daylene Posey, [facility]  Clinical Pharmacist 11/08/22 6:51 PM

## 2022-11-08 NOTE — Assessment & Plan Note (Signed)
-   will replace electrolytes and repeat  check Mg, phos and Ca level and replace as needed Monitor on telemetry   Lab Results  Component Value Date   K 3.2 (L) 11/08/2022     Lab Results  Component Value Date   CREATININE 1.06 (H) 11/08/2022   No results found for: "MG" Lab Results  Component Value Date   CALCIUM 7.7 (L) 11/08/2022   PHOS 2.8 12/24/2012
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
## 2022-11-08 NOTE — Progress Notes (Signed)
Date and time results received: 11/08/22 0845   Test: Hemoglobin Critical Value: 5.8  Name of Provider Notified: Beckey Downing, PA and Merrilee Seashore, charge RN in radiology notified

## 2022-11-08 NOTE — Progress Notes (Signed)
Pharmacy Antibiotic Note  Marisa Contreras is a 47 y.o. female admitted on 11/08/2022 with  chief complaint of jaundice, anemia .  Pharmacy has been consulted for Zosyn dosing for potential intra-abdominal infection. Was planned for outpatient paracentesis and liver biopsy at which she was found with Hgb 5.7 and sent to ED.   Plan: Zosyn 3.375g IV q8h (4 hour infusion). Monitor renal function for dose adjustments F/u culture data for deescalation and duration of therapy  Height: 5\' 7"  (170.2 cm) Weight: 58 kg (127 lb 13.9 oz) IBW/kg (Calculated) : 61.6  Temp (24hrs), Avg:98.1 F (36.7 C), Min:97.5 F (36.4 C), Max:98.7 F (37.1 C)  Recent Labs  Lab 11/08/22 0758  WBC 7.1  CREATININE 1.06*    Estimated Creatinine Clearance: 61.4 mL/min (A) (by C-G formula based on SCr of 1.06 mg/dL (H)).    No Known Allergies  Antimicrobials this admission: Zosyn 11/7 >>  Microbiology results: Cdif 11/7: GI/stool panel 11/7:  Thank you for allowing pharmacy to be a part of this patient's care.  Rutherford Nail, PharmD PGY2 Critical Care Pharmacy Resident 11/08/2022 8:09 PM

## 2022-11-08 NOTE — Assessment & Plan Note (Signed)
Allow permissive HTN  

## 2022-11-08 NOTE — Assessment & Plan Note (Signed)
In the setting of liver disease Obtain urine electrolytes check TSH check chest x-ray

## 2022-11-08 NOTE — Assessment & Plan Note (Signed)
Appreciate GI consult IR consult for possible paracentesis Given mild AKI may benefit from Albumin administration Once euvolemic would benefit from Lasix spironolactone titration and monitoring of renal function

## 2022-11-08 NOTE — Assessment & Plan Note (Addendum)
-   Check TSH continue home medications Synthroid at  100 mcg po q day Need to clarify if pt has been compliant may need to go up on the dose

## 2022-11-08 NOTE — Assessment & Plan Note (Signed)
Will replace ?

## 2022-11-09 ENCOUNTER — Inpatient Hospital Stay (HOSPITAL_COMMUNITY): Payer: 59 | Admitting: Anesthesiology

## 2022-11-09 ENCOUNTER — Inpatient Hospital Stay (HOSPITAL_COMMUNITY): Payer: 59

## 2022-11-09 ENCOUNTER — Encounter (HOSPITAL_COMMUNITY)
Admission: EM | Disposition: A | Discharge status: Home / Self Care | Payer: Self-pay | Source: Home / Self Care | Attending: Internal Medicine

## 2022-11-09 DIAGNOSIS — K7031 Alcoholic cirrhosis of liver with ascites: Secondary | ICD-10-CM | POA: Diagnosis not present

## 2022-11-09 DIAGNOSIS — K529 Noninfective gastroenteritis and colitis, unspecified: Secondary | ICD-10-CM | POA: Diagnosis not present

## 2022-11-09 DIAGNOSIS — D500 Iron deficiency anemia secondary to blood loss (chronic): Secondary | ICD-10-CM

## 2022-11-09 DIAGNOSIS — R195 Other fecal abnormalities: Secondary | ICD-10-CM

## 2022-11-09 DIAGNOSIS — E876 Hypokalemia: Secondary | ICD-10-CM | POA: Diagnosis not present

## 2022-11-09 DIAGNOSIS — I8500 Esophageal varices without bleeding: Secondary | ICD-10-CM

## 2022-11-09 DIAGNOSIS — D649 Anemia, unspecified: Secondary | ICD-10-CM | POA: Diagnosis not present

## 2022-11-09 HISTORY — PX: IR PARACENTESIS: IMG2679

## 2022-11-09 HISTORY — PX: BIOPSY: SHX5522

## 2022-11-09 HISTORY — PX: ESOPHAGOGASTRODUODENOSCOPY: SHX5428

## 2022-11-09 LAB — RAPID URINE DRUG SCREEN, HOSP PERFORMED
Amphetamines: NOT DETECTED
Barbiturates: NOT DETECTED
Benzodiazepines: NOT DETECTED
Cocaine: NOT DETECTED
Opiates: NOT DETECTED
Tetrahydrocannabinol: NOT DETECTED

## 2022-11-09 LAB — URINALYSIS, COMPLETE (UACMP) WITH MICROSCOPIC
Bilirubin Urine: NEGATIVE
Glucose, UA: NEGATIVE mg/dL
Hgb urine dipstick: NEGATIVE
Ketones, ur: 5 mg/dL — AB
Leukocytes,Ua: NEGATIVE
Nitrite: NEGATIVE
Protein, ur: NEGATIVE mg/dL
Specific Gravity, Urine: 1.042 — ABNORMAL HIGH (ref 1.005–1.030)
pH: 5 (ref 5.0–8.0)

## 2022-11-09 LAB — BLOOD GAS, VENOUS
Acid-Base Excess: 3.6 mmol/L — ABNORMAL HIGH (ref 0.0–2.0)
Bicarbonate: 26.5 mmol/L (ref 20.0–28.0)
Drawn by: 2585
O2 Saturation: 85.6 %
Patient temperature: 37
pCO2, Ven: 34 mm[Hg] — ABNORMAL LOW (ref 44–60)
pH, Ven: 7.5 — ABNORMAL HIGH (ref 7.25–7.43)
pO2, Ven: 51 mm[Hg] — ABNORMAL HIGH (ref 32–45)

## 2022-11-09 LAB — COMPREHENSIVE METABOLIC PANEL
ALT: 24 U/L (ref 0–44)
ALT: 28 U/L (ref 0–44)
AST: 92 U/L — ABNORMAL HIGH (ref 15–41)
AST: 96 U/L — ABNORMAL HIGH (ref 15–41)
Albumin: 2.4 g/dL — ABNORMAL LOW (ref 3.5–5.0)
Albumin: 2.6 g/dL — ABNORMAL LOW (ref 3.5–5.0)
Alkaline Phosphatase: 80 U/L (ref 38–126)
Alkaline Phosphatase: 91 U/L (ref 38–126)
Anion gap: 13 (ref 5–15)
Anion gap: 13 (ref 5–15)
BUN: 6 mg/dL (ref 6–20)
BUN: 8 mg/dL (ref 6–20)
CO2: 23 mmol/L (ref 22–32)
CO2: 24 mmol/L (ref 22–32)
Calcium: 7.7 mg/dL — ABNORMAL LOW (ref 8.9–10.3)
Calcium: 8.3 mg/dL — ABNORMAL LOW (ref 8.9–10.3)
Chloride: 91 mmol/L — ABNORMAL LOW (ref 98–111)
Chloride: 90 mmol/L — ABNORMAL LOW (ref 98–111)
Creatinine, Ser: 1.06 mg/dL — ABNORMAL HIGH (ref 0.44–1.00)
Creatinine, Ser: 1.22 mg/dL — ABNORMAL HIGH (ref 0.44–1.00)
GFR, Estimated: >60 mL/min (ref >60)
GFR, Estimated: 56 mL/min — ABNORMAL LOW (ref >60)
Glucose, Bld: 97 mg/dL (ref 70–99)
Glucose, Bld: 96 mg/dL (ref 70–99)
Potassium: 3.6 mmol/L (ref 3.5–5.1)
Potassium: 3.7 mmol/L (ref 3.5–5.1)
Sodium: 127 mmol/L — ABNORMAL LOW (ref 135–145)
Sodium: 127 mmol/L — ABNORMAL LOW (ref 135–145)
Total Bilirubin: 6.6 mg/dL — ABNORMAL HIGH (ref <1.2)
Total Bilirubin: 9.2 mg/dL — ABNORMAL HIGH (ref <1.2)
Total Protein: 6.6 g/dL (ref 6.5–8.1)
Total Protein: 7.4 g/dL (ref 6.5–8.1)

## 2022-11-09 LAB — PROTIME-INR
INR: 1.5 — ABNORMAL HIGH (ref 0.8–1.2)
Prothrombin Time: 18.8 s — ABNORMAL HIGH (ref 11.4–15.2)

## 2022-11-09 LAB — POCT I-STAT, CHEM 8
BUN: 3 mg/dL — ABNORMAL LOW (ref 6–20)
Calcium, Ion: 0.97 mmol/L — ABNORMAL LOW (ref 1.15–1.40)
Chloride: 87 mmol/L — ABNORMAL LOW (ref 98–111)
Creatinine, Ser: 1.1 mg/dL — ABNORMAL HIGH (ref 0.44–1.00)
Glucose, Bld: 97 mg/dL (ref 70–99)
HCT: 17 % — ABNORMAL LOW (ref 36.0–46.0)
Hemoglobin: 5.8 g/dL — CL (ref 12.0–15.0)
Potassium: 3.5 mmol/L (ref 3.5–5.1)
Sodium: 129 mmol/L — ABNORMAL LOW (ref 135–145)
TCO2: 24 mmol/L (ref 22–32)

## 2022-11-09 LAB — CREATININE, URINE, RANDOM: Creatinine, Urine: 148 mg/dL

## 2022-11-09 LAB — CBC
HCT: 19.5 % — ABNORMAL LOW (ref 36.0–46.0)
HCT: 26.1 % — ABNORMAL LOW (ref 36.0–46.0)
Hemoglobin: 6.6 g/dL — CL (ref 12.0–15.0)
Hemoglobin: 9 g/dL — ABNORMAL LOW (ref 12.0–15.0)
MCH: 33.7 pg (ref 26.0–34.0)
MCH: 33.7 pg (ref 26.0–34.0)
MCHC: 33.8 g/dL (ref 30.0–36.0)
MCHC: 34.5 g/dL (ref 30.0–36.0)
MCV: 99.5 fL (ref 80.0–100.0)
MCV: 97.8 fL (ref 80.0–100.0)
Platelets: 105 10*3/uL — ABNORMAL LOW (ref 150–400)
Platelets: 132 10*3/uL — ABNORMAL LOW (ref 150–400)
RBC: 1.96 MIL/uL — ABNORMAL LOW (ref 3.87–5.11)
RBC: 2.67 MIL/uL — ABNORMAL LOW (ref 3.87–5.11)
RDW: 20.8 % — ABNORMAL HIGH (ref 11.5–15.5)
RDW: 20.1 % — ABNORMAL HIGH (ref 11.5–15.5)
WBC: 6.4 10*3/uL (ref 4.0–10.5)
WBC: 6.8 10*3/uL (ref 4.0–10.5)
nRBC: 0 % (ref 0.0–0.2)
nRBC: 0.4 % — ABNORMAL HIGH (ref 0.0–0.2)

## 2022-11-09 LAB — PREPARE RBC (CROSSMATCH)

## 2022-11-09 LAB — MAGNESIUM
Magnesium: 0.9 mg/dL — CL (ref 1.7–2.4)
Magnesium: 1.5 mg/dL — ABNORMAL LOW (ref 1.7–2.4)

## 2022-11-09 LAB — HAPTOGLOBIN: Haptoglobin: 122 mg/dL (ref 42–296)

## 2022-11-09 LAB — GLUCOSE, CAPILLARY: Glucose-Capillary: 99 mg/dL (ref 70–99)

## 2022-11-09 LAB — LACTATE DEHYDROGENASE: LDH: 177 U/L (ref 98–192)

## 2022-11-09 LAB — SODIUM, URINE, RANDOM: Sodium, Ur: <10 mmol/L

## 2022-11-09 LAB — SARS CORONAVIRUS 2 BY RT PCR: SARS Coronavirus 2 by RT PCR: NEGATIVE

## 2022-11-09 LAB — LIPID PANEL
Cholesterol: 139 mg/dL (ref 0–200)
HDL: 22 mg/dL — ABNORMAL LOW (ref >40)
LDL Cholesterol: 100 mg/dL — ABNORMAL HIGH (ref 0–99)
Total CHOL/HDL Ratio: 6.3 {ratio}
Triglycerides: 86 mg/dL (ref <150)
VLDL: 17 mg/dL (ref 0–40)

## 2022-11-09 LAB — LACTIC ACID, PLASMA: Lactic Acid, Venous: 0.8 mmol/L (ref 0.5–1.9)

## 2022-11-09 LAB — OSMOLALITY, URINE: Osmolality, Ur: 411 mosm/kg (ref 300–900)

## 2022-11-09 LAB — PREALBUMIN: Prealbumin: <5 mg/dL — ABNORMAL LOW (ref 18–38)

## 2022-11-09 LAB — PHOSPHORUS: Phosphorus: 3.9 mg/dL (ref 2.5–4.6)

## 2022-11-09 SURGERY — EGD (ESOPHAGOGASTRODUODENOSCOPY)
Anesthesia: Monitor Anesthesia Care

## 2022-11-09 MED ORDER — LIDOCAINE HCL 1 % IJ SOLN
20.0000 mL | Freq: Once | INTRAMUSCULAR | Status: AC
  Administered 2022-11-09: 10 mL via INTRADERMAL

## 2022-11-09 MED ORDER — LIDOCAINE HCL (CARDIAC) PF 100 MG/5ML IV SOSY
PREFILLED_SYRINGE | INTRAVENOUS | Status: DC | PRN
  Administered 2022-11-09: 60 mL via INTRAVENOUS

## 2022-11-09 MED ORDER — PROPOFOL 10 MG/ML IV BOLUS
INTRAVENOUS | Status: DC | PRN
  Administered 2022-11-09: 100 mg via INTRAVENOUS
  Administered 2022-11-09: 150 ug/kg/min via INTRAVENOUS

## 2022-11-09 MED ORDER — LIDOCAINE HCL 1 % IJ SOLN
INTRAMUSCULAR | Status: AC
Start: 2022-11-09 — End: ?
  Filled 2022-11-09: qty 20

## 2022-11-09 MED ORDER — MAGNESIUM SULFATE 2 GM/50ML IV SOLN
2.0000 g | Freq: Once | INTRAVENOUS | Status: AC
  Administered 2022-11-09: 2 g via INTRAVENOUS
  Filled 2022-11-09: qty 50

## 2022-11-09 MED ORDER — ALBUMIN HUMAN 25 % IV SOLN
12.5000 g | Freq: Once | INTRAVENOUS | Status: AC
  Administered 2022-11-10: 12.5 g via INTRAVENOUS
  Filled 2022-11-09: qty 50

## 2022-11-09 MED ORDER — PANTOPRAZOLE SODIUM 40 MG IV SOLR
40.0000 mg | Freq: Two times a day (BID) | INTRAVENOUS | Status: DC
  Administered 2022-11-09 – 2022-11-12 (×7): 40 mg via INTRAVENOUS
  Filled 2022-11-09 (×7): qty 10

## 2022-11-09 MED ORDER — SODIUM CHLORIDE 0.9% IV SOLUTION: Freq: Once | INTRAVENOUS | Status: AC

## 2022-11-09 MED ORDER — SODIUM CHLORIDE 0.9 % IV SOLN: INTRAVENOUS | Status: DC

## 2022-11-09 MED ORDER — LACTATED RINGERS IV SOLN
INTRAVENOUS | Status: DC | PRN
Start: 2022-11-09 — End: 2022-11-09

## 2022-11-09 MED ORDER — NADOLOL 20 MG PO TABS
20.0000 mg | ORAL_TABLET | Freq: Every day | ORAL | Status: DC
  Administered 2022-11-09 – 2022-11-10 (×2): 20 mg via ORAL
  Filled 2022-11-09 (×3): qty 1

## 2022-11-09 MED ORDER — MELATONIN 5 MG PO TABS
5.0000 mg | ORAL_TABLET | Freq: Every evening | ORAL | Status: DC | PRN
  Administered 2022-11-09: 5 mg via ORAL
  Filled 2022-11-09 (×3): qty 1
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
## 2022-11-09 NOTE — Progress Notes (Signed)
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  Here Hgb was 12.1 in 2017.  No interval value.  She presented to IR for paracentesis, and admitted for symptomatic anemia and recurrent ascites.  Hemoccult positive.  CT raise: None for colitis and liver cirrhosis with mild splenomegaly and mild ascites.  Two units of blood, anemia panel and paracentesis ordered.  Started on IV Zosyn as well.  GI consulted, and planning EGD.   Subjective: Seen and examined earlier this morning.  No major events overnight of this morning.  Hemoglobin improved to 6.6 after 1 unit.  Received a second unit earlier this morning.  Posttransfusion CBC pending.  Reports diffuse abdominal pain and distention.  She thinks she might have a UTI but no convincing symptoms.  Her urinalysis is negative as well.  She is already on IV Zosyn.  No nausea, vomiting or diarrhea.  Objective: Vitals:   11/09/22 0800 11/09/22 0815 11/09/22 0845 11/09/22 1029  BP: 113/75 112/80 109/79   Pulse: 85     Resp: 17     Temp:    97.8 F (36.6 C)  TempSrc:    Oral  SpO2: 90%     Weight:      Height:        Examination:  GENERAL: No apparent distress.  Nontoxic. HEENT: MMM.  Vision and hearing grossly intact.  NECK: Supple.  No apparent JVD.  RESP:  No IWOB.  Fair aeration bilaterally. CVS:  RRR. Heart sounds normal.  ABD/GI/GU: BS+. Abd full and distended.  Some tenderness with palpation. MSK/EXT:  Moves extremities. No apparent deformity.  BLE pedal edema. SKIN: Skin jaundice in face. NEURO: Awake, alert and oriented appropriately.  No apparent focal neuro deficit. PSYCH:  Calm. Normal affect.   Procedures:  11/8-IR paracentesis with removal of 1.8 L bright yellow fluid  Microbiology summarized: Pleural fluid culture pending. C. difficile and GIP  Assessment and plan: Iron deficiency anemia: Concerned about blood loss.  Denies melena, hematochezia, hemoemesis or other overt bleeding.  She has history of liver cirrhosis.  Hemoccult positive.  Hgb 12.1 and 2017.  No interval value.  Anemia panel with folic acid deficiency.  Hgb improved to 9.0 after 2 units. Recent Labs    11/08/22 0758 11/08/22 2149 11/09/22 0051 11/09/22 0945  HGB 5.8* 6.6* 6.6* 9.0*  -Start IV Protonix 40 mg twice daily -Monitor H&H.  Transfuse for Hgb <7.0. -Folic acid supplementation -Continue IV Zosyn for now -Follow GI recommendation  Alcoholic liver cirrhosis with recurrent ascites: Reports drinking 2 glasses of wine daily.  Denies history of withdrawal.  Had paracentesis few days ago.  SAAG suggests portal hypertension.  Abdomen significantly distended.  Continues to drink 2 glasses of wine daily.  -S/p paracentesis with removal of 1.8 L -Follow-up fluid studies including cytology and culture -Encourage alcohol cessation -Appreciate GI input-plan for EGD and also ordered serologies to evaluate for other causes of liver cirrhosis -Check AFP -May need diuretics to minimize recurrence of ascites. -Continue IV Zosyn  Colitis: CT concerning for colitis in ascending, descending and sigmoid colon.  She does not have  diarrhea or fever.  CRP 4.3.  ESR 65. -Continue IV Zosyn -C. difficile and GIP if she can provide samples.  Alcohol abuse: Reports drinking 2 glasses of wine daily.  Denies history of withdrawal. -Encourage alcohol cessation -Continue multivitamin, folic acid and thiamine -Consult TOC  Hyponatremia: Likely hypervolemic from liver cirrhosis.  Stable. -Continue monitoring  Thrombocytopenia: Likely due to liver cirrhosis and splenomegaly -Continue  monitoring  Hypothyroidism: TSH elevated to 13.  Support to be on short 100 mcg daily.  Not sure about compliance -Continue home Synthroid -Recheck thyroid panel in 3 to 4 weeks.  Folic acid deficiency: Likely due to noncompliance -Folic acid supplementation  Severe malnutrition/unintentional weight loss: Likely due to liver disease.  Prealbumin <5.0 Body mass index is 20.03 kg/m. -Consult dietitian          DVT prophylaxis:  SCDs Start: 11/08/22 2031  Code Status: None Family Communication: None at bedside Level of care: Progressive Status is: Inpatient Remains inpatient appropriate because: Blood loss anemia, liver cirrhosis with recurrent ascites   Final disposition: Home once medically stable Consultants:  Gastroenterology Interventional radiology  55 minutes with more than 50% spent in reviewing records, counseling patient/family and coordinating care.   Sch Meds:  Scheduled Meds:  folic acid  1 mg Oral Daily   levothyroxine  100 mcg Oral QAC breakfast   multivitamin with minerals  1 tablet Oral Daily   thiamine  100 mg Oral Daily   Or   thiamine  100 mg Intravenous Daily   Continuous Infusions:  sodium chloride Stopped (11/09/22 0629)   piperacillin-tazobactam (ZOSYN)  IV Stopped (11/09/22 0629)   PRN Meds:.fentaNYL (SUBLIMAZE) injection, LORazepam **OR** LORazepam, ondansetron **OR** ondansetron (ZOFRAN) IV  Antimicrobials: Anti-infectives (From admission, onward)    Start     Dose/Rate Route Frequency Ordered Stop   11/09/22 0300  piperacillin-tazobactam (ZOSYN) IVPB 3.375 g        3.375 g 12.5 mL/hr over 240 Minutes Intravenous Every 8 hours 11/08/22 2008     11/08/22 1845  piperacillin-tazobactam (ZOSYN) IVPB 3.375 g        3.375 g 100 mL/hr over 30 Minutes Intravenous  Once 11/08/22 1842 11/08/22 1938        I have personally reviewed the following labs and images: CBC: Recent Labs  Lab 11/08/22 0758 11/08/22 2149 11/09/22 0051  11/09/22 0945  WBC 7.1 6.3 6.4 6.8  NEUTROABS  --  4.5  --   --   HGB 5.8* 6.6* 6.6* 9.0*  HCT 17.2* 19.7* 19.5* 26.1*  MCV 108.2* 99.0 99.5 97.8  PLT 140* 115* 105* 132*   BMP &GFR Recent Labs  Lab 11/08/22 0758 11/08/22 2149 11/09/22 0051  NA 126* 126* 127*  K 3.2* 3.7 3.6  CL 88* 93* 91*  CO2 23 22 23   GLUCOSE 99 95 97  BUN <5* 7 6  CREATININE 1.06* 1.06* 1.06*  CALCIUM 7.7* 7.5* 7.7*  MG  --  0.9* 0.9*  PHOS  --  3.9 3.9   Estimated Creatinine Clearance: 61.4 mL/min (A) (by C-G formula based on SCr of 1.06 mg/dL (H)). Liver & Pancreas: Recent Labs  Lab 11/08/22 0758 11/08/22 2149 11/09/22 0051  AST 122* 96* 92*  ALT 32 26 24  ALKPHOS 102 84 80  BILITOT 5.7* 6.7* 6.6*  PROT 7.6 6.4* 6.6  ALBUMIN 2.6* 2.1* 2.4*   Recent Labs  Lab 11/08/22 1207  LIPASE 53*   Recent Labs  Lab 11/08/22 2149  AMMONIA 38*   Diabetic:  No results for input(s): "HGBA1C" in the last 72 hours. No results for input(s): "GLUCAP" in the last 168 hours. Cardiac Enzymes: Recent Labs  Lab 11/08/22 2149  CKTOTAL 48   No results for input(s): "PROBNP" in the last 8760 hours. Coagulation Profile: Recent Labs  Lab 11/08/22 0758 11/09/22 0051  INR 1.5* 1.5*   Thyroid Function Tests: Recent Labs    11/08/22 2149  TSH 12.981*  FREET4 1.18*   Lipid Profile: Recent Labs    11/09/22 0051  CHOL 139  HDL 22*  LDLCALC 100*  TRIG 86  CHOLHDL 6.3   Anemia Panel: Recent Labs    11/08/22 1203 11/08/22 1207  VITAMINB12 653  --   FOLATE 3.2*  --   FERRITIN  --  448*  TIBC  --  119*  IRON  --  119  RETICCTPCT 4.2*  --    Urine analysis:    Component Value Date/Time   COLORURINE AMBER (A) 11/09/2022 0450   APPEARANCEUR CLEAR 11/09/2022 0450   LABSPEC 1.042 (H) 11/09/2022 0450   PHURINE 5.0 11/09/2022 0450   GLUCOSEU NEGATIVE 11/09/2022 0450   HGBUR NEGATIVE 11/09/2022 0450   BILIRUBINUR NEGATIVE 11/09/2022 0450   KETONESUR 5 (A) 11/09/2022 0450   PROTEINUR  NEGATIVE 11/09/2022 0450   NITRITE NEGATIVE 11/09/2022 0450   LEUKOCYTESUR NEGATIVE 11/09/2022 0450   Sepsis Labs: Invalid input(s): "PROCALCITONIN", "LACTICIDVEN"  Microbiology: No results found for this or any previous visit (from the past 240 hour(s)).  Radiology Studies: IR Paracentesis  Result Date: 11/09/2022 INDICATION: 47 year old female with history of jaundice, recurrent ascites. Paracentesis requested. EXAM: ULTRASOUND GUIDED THERAPEUTIC PARACENTESIS MEDICATIONS: 10 mL 1% lidocaine COMPLICATIONS: None immediate. PROCEDURE: Informed written consent was obtained from the patient after a discussion of the risks, benefits and alternatives to treatment. A timeout was performed prior to the initiation of the procedure. Initial ultrasound scanning demonstrates a small amount of ascites within the right lower abdominal quadrant. The right lower abdomen was prepped and draped in the usual sterile fashion. 1% lidocaine was used for local anesthesia. Following this, a 19 gauge, 7-cm, Yueh catheter was introduced. An ultrasound image was saved for documentation purposes. The paracentesis was performed. The catheter was removed and a dressing was applied. The patient tolerated the procedure well without immediate post procedural complication. FINDINGS: A total of approximately 1.8 liters of bright yellow fluid was removed. IMPRESSION: Successful ultrasound-guided paracentesis yielding 1.8 liters of peritoneal fluid. Performed by: Loyce Dys PA-C Electronically Signed   By: Gilmer Mor D.O.   On: 11/09/2022 10:04   DG Chest Port 1 View  Result Date: 11/09/2022 CLINICAL DATA:  47 year old female with shortness of breath. EXAM: PORTABLE CHEST 1 VIEW COMPARISON:  Chest CT 07/13/2015. FINDINGS: Portable AP semi upright view at 0656 hours. Mediastinal contours remain normal. Lower lung volumes. Visualized tracheal air column is within normal limits. Patchy bibasilar opacity most resembling  atelectasis. No pneumothorax, pulmonary edema, pleural effusion or consolidation. Negative visible osseous structures. Paucity bowel gas in the visible abdomen. IMPRESSION: Lower lung volumes with basilar atelectasis. Electronically Signed   By: Odessa Fleming M.D.   On: 11/09/2022 07:58   CT ABDOMEN PELVIS W CONTRAST  Result Date: 11/08/2022 CLINICAL DATA:  Acute generalized abdominal pain, jaundice. EXAM: CT ABDOMEN AND PELVIS WITH CONTRAST TECHNIQUE: Multidetector CT imaging of the abdomen and pelvis was performed using the standard protocol following bolus administration of intravenous contrast. RADIATION DOSE REDUCTION: This exam was performed according to the departmental dose-optimization program  which includes automated exposure control, adjustment of the mA and/or kV according to patient size and/or use of iterative reconstruction technique. CONTRAST:  75mL OMNIPAQUE IOHEXOL 350 MG/ML SOLN COMPARISON:  None Available. FINDINGS: Lower chest: Minimal bibasilar subsegmental atelectasis. Hepatobiliary: Minimal cholelithiasis. No biliary dilatation. Minimal nodular hepatic contours are noted suggesting hepatic cirrhosis. Slightly heterogeneous hepatic parenchyma is noted, and underlying metastatic disease cannot be excluded. Pancreas: Unremarkable. No pancreatic ductal dilatation or surrounding inflammatory changes. Spleen: Mild splenomegaly is noted. Adrenals/Urinary Tract: Adrenal glands are unremarkable. Kidneys are normal, without renal calculi, focal lesion, or hydronephrosis. Bladder is unremarkable. Stomach/Bowel: Stomach is unremarkable. No abnormal bowel dilatation is noted. Wall thickening of cecum is noted as well as descending and sigmoid colon concerning for infectious or inflammatory colitis. Vascular/Lymphatic: No significant vascular findings are present. No enlarged abdominal or pelvic lymph nodes. Reproductive: Uterus and bilateral adnexa are unremarkable. Other: Mild ascites is noted.  Musculoskeletal: No acute or significant osseous findings. IMPRESSION: Wall thickening of ascending, descending and sigmoid colon is noted concerning for infectious or inflammatory colitis. Mildly heterogeneous appearance of hepatic parenchyma is noted most likely related to cirrhosis, but underlying metastatic disease cannot be excluded. MRI may be considered for further evaluation. Findings consistent with hepatic cirrhosis with mild splenomegaly and mild ascites. Minimal cholelithiasis. Electronically Signed   By: Lupita Raider M.D.   On: 11/08/2022 15:58      Mitsy Owen T. Lametria Klunk [facility]  If 7PM-7AM, please contact night-coverage [email or web address] 11/09/2022, 10:56 AM

## 2022-11-09 NOTE — Interval H&P Note (Signed)
History and Physical Interval Note:  11/09/2022 11:52 AM  Marisa Contreras  has presented today for surgery, with the diagnosis of portal hypertension, cirrhosis, anemia, heme positive stool, GERD.  The various methods of treatment have been discussed with the patient and family. After consideration of risks, benefits and other options for treatment, the patient has consented to  Procedure(s): ESOPHAGOGASTRODUODENOSCOPY (EGD) (N/A) as a surgical intervention.  The patient's history has been reviewed, patient examined, no change in status, stable for surgery.  I have reviewed the patient's chart and labs.  Questions were answered to the patient's satisfaction.     Gerritt Galentine

## 2022-11-09 NOTE — Transfer of Care (Signed)
Immediate Anesthesia Transfer of Care Note  Patient: Marisa Contreras  Procedure(s) Performed: ESOPHAGOGASTRODUODENOSCOPY (EGD) BIOPSY  Patient Location: Endoscopy Unit  Anesthesia Type:MAC  Level of Consciousness: sedated  Airway & Oxygen Therapy: Patient Spontanous Breathing and Patient connected to nasal cannula oxygen  Post-op Assessment: Report given to RN and Post -op Vital signs reviewed and stable  Post vital signs: Reviewed and stable  Last Vitals:  Vitals Value Taken Time  BP    Temp    Pulse 91 11/09/22 1222  Resp 22 11/09/22 1222  SpO2 96 % 11/09/22 1222  Vitals shown include unfiled device data.  Last Pain:  Vitals:   11/09/22 1129  TempSrc: Temporal  PainSc: 0-No pain         Complications: No notable events documented.

## 2022-11-09 NOTE — ED Notes (Signed)
NAD noted at this time, pt resting in bed, respirations even and unlabored, skin warm and dry, bed in low position, call light within reach. Comfort measures offered. Warm blanket given. Will continue to monitor. Blood transfusing, no reactions noted.

## 2022-11-09 NOTE — ED Notes (Signed)
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 (From admission, onward)     Start     Ordered   11/09/22 0811  Comprehensive metabolic panel  Once,   R        11/09/22 0810   11/09/22 0804  Magnesium  Once,   R         11/09/22 0803   11/09/22 0803  CBC  Once,   R       Question:  Release to patient  Answer:  Immediate   11/09/22 0802   11/09/22 0658  AFP tumor marker  Once,   R        11/09/22 0657   11/08/22 2158  Calcium, ionized  Once,   AD        11/08/22 2158   11/08/22 2158  T3  Once,   AD        11/08/22 2158   11/08/22 2158  Vitamin B1  Once,   AD        11/08/22 2158   11/08/22 2141  Body fluid cell count with differential  (Thoracentesis Labs Panel)  Once,   R       Question Answer Comment  Are there also cytology or pathology orders on this specimen? Yes   Release to patient Immediate      11/08/22 2141   11/08/22 2141  Lactate dehydrogenase (pleural or peritoneal fluid)  (Thoracentesis Labs Panel)  Once,   R        11/08/22 2141   11/08/22 2141  Protein, pleural or peritoneal fluid  (Thoracentesis Labs Panel)  Once,   R        11/08/22 2141   11/08/22 2141  Body fluid culture w Gram Stain  (Thoracentesis Labs Panel)  Once,   R       Question Answer Comment  Are there also cytology or pathology orders on this specimen? Yes   Patient immune status Normal   Release to patient Immediate      11/08/22 2141   11/08/22 2140  Albumin, pleural or peritoneal fluid   (Thoracentesis Labs Panel)  Once,   R        11/08/22 2141   11/08/22 2139  SARS Coronavirus 2 by RT PCR (hospital order, performed in [facility] hospital lab) *cepheid single result test* Anterior Nasal Swab  (Tier 2 - SARS Coronavirus 2 by RT PCR (hospital order, performed in [facility] hospital lab) *cepheid single result test*)  Once,   R        11/08/22 2138   11/08/22 1952  Gastrointestinal Panel by PCR , Stool  (Gastrointestinal Panel by PCR, Stool                                                                                                                                                     **  Does Not include CLOSTRIDIUM DIFFICILE testing. **If CDIFF testing is needed, place order from the "C Difficile Testing" order  set.**)  Once,   R        11/08/22 1951   11/08/22 1952  C Difficile Quick Screen w PCR reflex  (C Difficile quick screen w PCR reflex panel )  Once, for 24 hours,   TIMED       References:    CDiff Information Tool   11/08/22 1951   11/08/22 1949  ANA w/Reflex if Positive  Once,   R        11/08/22 1948            Vitals/Pain Today's Vitals   11/09/22 0720 11/09/22 0800 11/09/22 0815 11/09/22 0845  BP: 111/78 113/75 112/80 109/79  Pulse: 84 85    Resp: 17 17    Temp:      TempSrc:      SpO2: 91% 90%    Weight:      Height:      PainSc:        Isolation Precautions Airborne and Contact precautions  Medications Medications  levothyroxine (SYNTHROID) tablet 100 mcg (100 mcg Oral Given 11/09/22 0932)  ondansetron (ZOFRAN) tablet 4 mg (has no administration in time range)    Or  ondansetron (ZOFRAN) injection 4 mg (has no administration in time range)  fentaNYL (SUBLIMAZE) injection 12.5-50 mcg (has no administration in time range)  folic acid (FOLVITE) tablet 1 mg (1 mg Oral Given 11/09/22 0932)  piperacillin-tazobactam (ZOSYN) IVPB 3.375 g (0 g Intravenous Stopped 11/09/22 0629)  LORazepam (ATIVAN) tablet 1-4 mg (1 mg Oral Given 11/09/22 0931)    Or  LORazepam (ATIVAN) injection 1-4 mg ( Intravenous See Alternative 11/09/22 0931)  thiamine (VITAMIN B1) tablet 100 mg (100 mg Oral Given 11/09/22 0932)    Or  thiamine (VITAMIN B1) injection 100 mg ( Intravenous See Alternative 11/09/22 0932)  multivitamin with minerals tablet 1 tablet (1 tablet Oral Given 11/09/22 0932)  0.9 %  sodium chloride infusion (0 mLs Intravenous Stopped 11/09/22 0629)  0.9 %  sodium chloride infusion (Manually program via Guardrails IV Fluids) (0 mLs Intravenous Stopped 11/08/22 1448)  potassium chloride SA (KLOR-CON M) CR tablet 40 mEq (40 mEq Oral Given 11/08/22 1225)  calcium gluconate 1 g/ 50 mL sodium chloride IVPB (0 mg Intravenous Stopped 11/08/22 1447)  iohexol (OMNIPAQUE) 350 MG/ML injection 75 mL  (75 mLs Intravenous Contrast Given 11/08/22 1350)  piperacillin-tazobactam (ZOSYN) IVPB 3.375 g (0 g Intravenous Stopped 11/08/22 1938)  albumin human 25 % solution 12.5 g (0 g Intravenous Stopped 11/09/22 0107)  magnesium sulfate IVPB 2 g 50 mL (0 g Intravenous Stopped 11/09/22 0245)  0.9 %  sodium chloride infusion (Manually program via Guardrails IV Fluids) (0 mLs Intravenous Stopped 11/09/22 0143)  lidocaine (XYLOCAINE) 1 % (with pres) injection 20 mL (10 mLs Intradermal Given 11/09/22 0836)    Mobility walks     Focused Assessments Jaundice and anemia   R Recommendations: See Admitting Provider Note  Report given to:   Additional Notes: go to endo today. They are waiting for her cbc to be resulted. 3 unit of blood.

## 2022-11-09 NOTE — Progress Notes (Signed)
Initial Nutrition Assessment  DOCUMENTATION CODES:   Not applicable, suspect malnutrition but unable to confirm at this time without completion of NFPE  INTERVENTION:   - Ensure Enlive po BID, each supplement provides 350 kcal and 20 grams of protein  - Diet education provided and "High Calorie, High Protein Nutrition Therapy" handout attached to AVS/Discharge Instructions  - Continue MVI with minerals, folic acid, and thiamine  NUTRITION DIAGNOSIS:   Inadequate oral intake related to poor appetite, early satiety, nausea as evidenced by per patient/family report.  GOAL:   Patient will meet greater than or equal to 90% of their needs  MONITOR:   PO intake, Supplement acceptance, Diet advancement, Labs, Weight trends, I & O's  REASON FOR ASSESSMENT:   Consult Assessment of nutrition requirement/status  ASSESSMENT:   47 year old female who presented to the ED on 11/07 with jaundice and anemia. PMH of hypothyroidism. Pt admitted with colitis, decompensated cirrhosis with recurrent ascites.  11/07 - clear liquid diet 11/08 - NPO; s/p paracentesis with 1.8 L fluid removed; s/p EGD showing esophageal varices, reflux esophagitis, portal hypertensive gastropathy; later advanced to full liquid diet  RD working remotely.  Spoke with pt via phone call to room. Pt endorses a poor appetite and poor PO intake. Pt reports initially unintentionally losing about 10 lbs in May 2024 from her UBW of 128-130 lbs down to 120 lbs. Pt reports that her eating patterns did not change and that she did not know why she had lost weight. Pt reports starting to rapidly gain weight in June 2024 due to fluid and states that this is what contributed to her epigastric pain, reflux, early satiety, and decreased appetite. Pt states that since early June, her PO intake has decreased dramatically to the point where she may only eat 1 baby carrot in the course of 24 hours. Pt states that eating more than this "is not  worth it" due to the terrible reflux she experiences. Pt does drink 2 glasses of wine daily along with 3-4 glasses of water with lemon which she reports helps her reflux. Pt does not drink other calorie-containing beverages like juice, milk, soda, etc.  Reviewed weight history in chart. Last available weight PTA is from September 2020 and is fairly consistent with current weight. However, suspect that current weight is falsely elevated due to fluid. Unsure of pt's true dry weight at this time but suspect pt may be underweight without the presence of fluid. Strongly suspect some degree of malnutrition but unable to confirm at this time without completing NFPE.  Pt willing to try oral nutrition supplements if it is something that she needs to do. Explained increased nutrient needs related to chronic illness as well as concern for malnutrition. Pt endorses experiencing muscle loss over the last several months. Pt willing to start with drinking 2 chocolate Ensure supplements daily. She has mostly been drinking broth this admission due to clear and full liquid diet orders. Encouraged pt to try eating solid foods once diet is advanced. Discussed consuming smaller, more frequent meals/snacks. RD added "High Calorie, High Protein Nutrition Therapy" handout from the Academy of [employer]etics to pt's AVS/Discharge Instructions. Pt is aware.  Medications reviewed and include: folic acid, MVI with minerals, IV protonix, thiamine, klor-con 40 mEq x 1, IV magnesium sulfate 4 grams x 1, IV abx  Micronutrient Profile: Thiamine B1: pending Vitamin B12: 653 (WNL) Folate B9: 3.2 (low) CRP: 4.3 (high)  Labs reviewed: sodium 127, potassium 3.2, chloride 93, creatinine 1.24,  magnesium 1.3, HDL 22, LDL 100, hemoglobin 8.2, platelets 108  NUTRITION - FOCUSED PHYSICAL EXAM:  Unable to complete. RD working remotely.  Diet Order:   Diet Order             Diet full liquid Room service appropriate? Yes; Fluid  consistency: Thin  Diet effective now                   EDUCATION NEEDS:   Education needs have been addressed  Skin:  Skin Assessment: Reviewed RN Assessment (jaundice)  Last BM:  11/10/22 type 5  Height:   Ht Readings from Last 1 Encounters:  11/08/22 5\' 7"  (1.702 m)    Weight:   Wt Readings from Last 1 Encounters:  11/08/22 58 kg    BMI:  Body mass index is 20.03 kg/m.  Estimated Nutritional Needs:   Kcal:  1800-2000  Protein:  75-90 grams  Fluid:  1.8 L/day    Mertie Clause, MS, RD, LDN Registered Dietitian II Please see AMiON for contact information.

## 2022-11-09 NOTE — Op Note (Signed)
[facility] ([facility]) Patient Name: Marisa Contreras Procedure Date : 11/09/2022 MRN: [id number] Attending MD: Napoleon Form , MD, [id number] Date of Birth: 10/29/1975 CSN: [id number] Age: 47 Admit Type: Inpatient Procedure:                Upper GI endoscopy Indications:              Suspected upper gastrointestinal bleeding, To                            evaluate esophageal varices in patient with                            cirrhosis/ portal hypertension, anemia secondary to                            chronic blood loss Providers:                Napoleon Form, MD, Lorenza Evangelist, RN,                            Salley Scarlet, Technician, Maryjean Morn, CRNA Referring MD:              Medicines:                Monitored Anesthesia Care, Propofol per Anesthesia Complications:            No immediate complications. Estimated Blood Loss:     Estimated blood loss was minimal. Procedure:                Pre-Anesthesia Assessment:                           - Prior to the procedure, a History and Physical                            was performed, and patient medications and                            allergies were reviewed. The patient's tolerance of                            previous anesthesia was also reviewed. The risks                            and benefits of the procedure and the sedation                            options and risks were discussed with the patient.                            All questions were answered, and informed consent                            was obtained. Prior Anticoagulants: The patient has  taken no anticoagulant or antiplatelet agents. ASA                            Grade Assessment: III - A patient with severe                            systemic disease. After reviewing the risks and                            benefits, the patient was deemed in satisfactory                            condition to undergo the  procedure.                           After obtaining informed consent, the endoscope was                            passed under direct vision. Throughout the                            procedure, the patient's blood pressure, pulse, and                            oxygen saturations were monitored continuously. The                            [id number]-[id number] ([id number]) Olympus endoscope was introduced                            through the mouth, and advanced to the second part                            of duodenum. The upper GI endoscopy was                            accomplished without difficulty. The patient                            tolerated the procedure well. Scope In: Scope Out: Findings:      Grade I varices were found in the lower third of the esophagus. They       were less than 5 mm in largest diameter.      LA Grade C (one or more mucosal breaks continuous between tops of 2 or       more mucosal folds, less than 75% circumference) esophagitis with no       bleeding was found 36 to 38 cm from the incisors.      Moderate portal hypertensive gastropathy was found in the entire       examined stomach. Biopsies were taken with a cold forceps for histology.       Biopsies were taken with a cold forceps for Helicobacter pylori testing.      The cardia and gastric fundus were normal on retroflexion.      The examined duodenum  was normal. Impression:               - Grade I esophageal varices.                           - LA Grade C reflux esophagitis with no bleeding.                           - Portal hypertensive gastropathy. Biopsied.                           - Normal examined duodenum. Recommendation:           - Patient has a contact number available for                            emergencies. The signs and symptoms of potential                            delayed complications were discussed with the                            patient. Return to normal activities tomorrow.                             Written discharge instructions were provided to the                            patient.                           - Resume previous diet.                           - Continue present medications.                           - Await pathology results.                           - No ibuprofen, naproxen, or other non-steroidal                            anti-inflammatory drugs.                           - Monitor for alcohol withdrawal                           - DC octreotide gtt                           - Pantoprazole 40mg  BID                           - Diagnostic paracentesis to exclude SBP                           -  Please refer to consult note for additional                            recommendations Procedure Code(s):        --- Professional ---                           [phone], Esophagogastroduodenoscopy, flexible,                            transoral; with biopsy, single or multiple Diagnosis Code(s):        --- Professional ---                           I85.00, Esophageal varices without bleeding                           K21.00, Gastro-esophageal reflux disease with                            esophagitis, without bleeding                           K76.6, Portal hypertension                           K31.89, Other diseases of stomach and duodenum                           D50.0, Iron deficiency anemia secondary to blood                            loss (chronic) CPT copyright 2022 American [employer]. All rights reserved. The codes documented in this report are preliminary and upon coder review may  be revised to meet current compliance requirements. Napoleon Form, MD 11/09/2022 12:27:23 PM This report has been signed electronically. Number of Addenda: 0

## 2022-11-09 NOTE — Procedures (Signed)
PROCEDURE SUMMARY:  Successful US guided paracentesis from left lateral abdomen.  Yielded 1.8 liters of yellow fluid.  No immediate complications.  Pt tolerated well.   Specimen was not sent for labs.  EBL < 5mL  Hoyt Koch PA-C 11/09/2022 10:05 AM

## 2022-11-09 NOTE — Anesthesia Preprocedure Evaluation (Signed)
Anesthesia Evaluation  Patient identified by MRN, date of birth, ID band Patient awake    Reviewed: Allergy & Precautions, H&P , NPO status , Patient's Chart, lab work & pertinent test results  Airway Mallampati: II   Neck ROM: full    Dental   Pulmonary former smoker   breath sounds clear to auscultation       Cardiovascular hypertension,  Rhythm:regular Rate:Normal     Neuro/Psych  Headaches  Neuromuscular disease    GI/Hepatic   Endo/Other  Hypothyroidism    Renal/GU      Musculoskeletal   Abdominal   Peds  Hematology   Anesthesia Other Findings   Reproductive/Obstetrics                             Anesthesia Physical Anesthesia Plan  ASA: 2  Anesthesia Plan: MAC   Post-op Pain Management:    Induction: Intravenous  PONV Risk Score and Plan: 2 and Propofol infusion and Treatment may vary due to age or medical condition  Airway Management Planned: Nasal Cannula  Additional Equipment:   Intra-op Plan:   Post-operative Plan:   Informed Consent: I have reviewed the patients History and Physical, chart, labs and discussed the procedure including the risks, benefits and alternatives for the proposed anesthesia with the patient or authorized representative who has indicated his/her understanding and acceptance.     Dental advisory given  Plan Discussed with: CRNA, Anesthesiologist and Surgeon  Anesthesia Plan Comments:        Anesthesia Quick Evaluation

## 2022-11-09 NOTE — Plan of Care (Signed)
IR was requested for diagnostic paracentesis.    Patient underwent therapeutic paracentesis this morning, no labs ordered.  Reviewed post para [place], she does not have any ascites left behind.  Her previous para on 10/10/22, only yielded 1.6 L. Informed GI that it is not clear if the patient will accumulate enough ascites during this admission for diagnostic para but IR will bring her down on Monday to eval.   The procedure is tentatively scheduled for Monday pending IR schedule.  Please call IR for questions and concerns.    Lynann Bologna Tadeo Besecker PA-C 11/09/2022 3:47 PM

## 2022-11-09 NOTE — Assessment & Plan Note (Signed)
Will replace - will replace electrolytes and repeat  check Mg, phos and Ca level and replace as needed Monitor on telemetry Also will replace calcium   Lab Results  Component Value Date   K 3.7 11/08/2022     Lab Results  Component Value Date   CREATININE 1.06 (H) 11/08/2022   Lab Results  Component Value Date   MG 0.9 (LL) 11/08/2022   Lab Results  Component Value Date   CALCIUM 7.5 (L) 11/08/2022   PHOS 3.9 11/08/2022
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
## 2022-11-10 DIAGNOSIS — K7460 Unspecified cirrhosis of liver: Secondary | ICD-10-CM

## 2022-11-10 DIAGNOSIS — K7682 Hepatic encephalopathy: Secondary | ICD-10-CM

## 2022-11-10 DIAGNOSIS — R17 Unspecified jaundice: Secondary | ICD-10-CM

## 2022-11-10 DIAGNOSIS — K7031 Alcoholic cirrhosis of liver with ascites: Secondary | ICD-10-CM | POA: Diagnosis not present

## 2022-11-10 DIAGNOSIS — I10 Essential (primary) hypertension: Secondary | ICD-10-CM | POA: Diagnosis not present

## 2022-11-10 DIAGNOSIS — R188 Other ascites: Secondary | ICD-10-CM

## 2022-11-10 DIAGNOSIS — K529 Noninfective gastroenteritis and colitis, unspecified: Secondary | ICD-10-CM | POA: Diagnosis not present

## 2022-11-10 LAB — TYPE AND SCREEN
ABO/RH(D): O POS
Antibody Screen: NEGATIVE
Unit division: 0
Unit division: 0
Unit division: 0

## 2022-11-10 LAB — T3: T3, Total: 53 ng/dL — ABNORMAL LOW (ref 71–180)

## 2022-11-10 LAB — COMPREHENSIVE METABOLIC PANEL
ALT: 23 U/L (ref 0–44)
AST: 81 U/L — ABNORMAL HIGH (ref 15–41)
Albumin: 2.2 g/dL — ABNORMAL LOW (ref 3.5–5.0)
Alkaline Phosphatase: 68 U/L (ref 38–126)
Anion gap: 10 (ref 5–15)
BUN: 9 mg/dL (ref 6–20)
CO2: 24 mmol/L (ref 22–32)
Calcium: 8.2 mg/dL — ABNORMAL LOW (ref 8.9–10.3)
Chloride: 93 mmol/L — ABNORMAL LOW (ref 98–111)
Creatinine, Ser: 1.24 mg/dL — ABNORMAL HIGH (ref 0.44–1.00)
GFR, Estimated: 55 mL/min — ABNORMAL LOW (ref >60)
Glucose, Bld: 91 mg/dL (ref 70–99)
Potassium: 3.2 mmol/L — ABNORMAL LOW (ref 3.5–5.1)
Sodium: 127 mmol/L — ABNORMAL LOW (ref 135–145)
Total Bilirubin: 5.1 mg/dL — ABNORMAL HIGH (ref <1.2)
Total Protein: 6.1 g/dL — ABNORMAL LOW (ref 6.5–8.1)

## 2022-11-10 LAB — BPAM RBC
Blood Product Expiration Date: 202411132359
Blood Product Expiration Date: 202411252359
Blood Product Expiration Date: 202411282359
ISSUE DATE / TIME: 202411071225
ISSUE DATE / TIME: 202411071456
ISSUE DATE / TIME: 202411080130
Unit Type and Rh: 5100
Unit Type and Rh: 5100
Unit Type and Rh: 9500

## 2022-11-10 LAB — CBC
HCT: 23.3 % — ABNORMAL LOW (ref 36.0–46.0)
Hemoglobin: 8.2 g/dL — ABNORMAL LOW (ref 12.0–15.0)
MCH: 34.3 pg — ABNORMAL HIGH (ref 26.0–34.0)
MCHC: 35.2 g/dL (ref 30.0–36.0)
MCV: 97.5 fL (ref 80.0–100.0)
Platelets: 108 10*3/uL — ABNORMAL LOW (ref 150–400)
RBC: 2.39 MIL/uL — ABNORMAL LOW (ref 3.87–5.11)
RDW: 19 % — ABNORMAL HIGH (ref 11.5–15.5)
WBC: 5.3 10*3/uL (ref 4.0–10.5)
nRBC: 0 % (ref 0.0–0.2)

## 2022-11-10 LAB — MAGNESIUM: Magnesium: 1.3 mg/dL — ABNORMAL LOW (ref 1.7–2.4)

## 2022-11-10 LAB — ANA W/REFLEX IF POSITIVE: Anti Nuclear Antibody (ANA): NEGATIVE

## 2022-11-10 MED ORDER — MAGNESIUM SULFATE 4 GM/100ML IV SOLN
4.0000 g | Freq: Once | INTRAVENOUS | Status: AC
  Administered 2022-11-10: 4 g via INTRAVENOUS
  Filled 2022-11-10: qty 100

## 2022-11-10 MED ORDER — ENSURE ENLIVE PO LIQD
237.0000 mL | Freq: Two times a day (BID) | ORAL | Status: DC
  Administered 2022-11-10 – 2022-11-12 (×3): 237 mL via ORAL

## 2022-11-10 MED ORDER — ORAL CARE MOUTH RINSE: 15.0000 mL | OROMUCOSAL | Status: DC | PRN

## 2022-11-10 MED ORDER — METRONIDAZOLE 500 MG PO TABS
500.0000 mg | ORAL_TABLET | Freq: Two times a day (BID) | ORAL | Status: DC
  Administered 2022-11-10 – 2022-11-13 (×7): 500 mg via ORAL
  Filled 2022-11-10 (×7): qty 1

## 2022-11-10 MED ORDER — POTASSIUM CHLORIDE CRYS ER 20 MEQ PO TBCR
40.0000 meq | EXTENDED_RELEASE_TABLET | Freq: Once | ORAL | Status: AC
  Administered 2022-11-10: 40 meq via ORAL
  Filled 2022-11-10: qty 2

## 2022-11-10 MED ORDER — SODIUM CHLORIDE 0.9 % IV SOLN
2.0000 g | INTRAVENOUS | Status: DC
  Administered 2022-11-10 – 2022-11-13 (×4): 2 g via INTRAVENOUS
  Filled 2022-11-10 (×4): qty 20

## 2022-11-10 MED ORDER — MIDODRINE HCL 5 MG PO TABS
10.0000 mg | ORAL_TABLET | Freq: Three times a day (TID) | ORAL | Status: DC
  Administered 2022-11-10 – 2022-11-14 (×12): 10 mg via ORAL
  Filled 2022-11-10 (×13): qty 2

## 2022-11-10 MED ORDER — FUROSEMIDE 20 MG PO TABS
20.0000 mg | ORAL_TABLET | Freq: Once | ORAL | Status: AC
  Administered 2022-11-10: 20 mg via ORAL
  Filled 2022-11-10: qty 1

## 2022-11-10 MED ORDER — SPIRONOLACTONE 25 MG PO TABS
50.0000 mg | ORAL_TABLET | Freq: Every day | ORAL | Status: DC
  Administered 2022-11-10: 50 mg via ORAL
  Filled 2022-11-10: qty 2

## 2022-11-10 NOTE — Discharge Instructions (Signed)
High-Calorie, High-Protein Nutrition Therapy (2021) A high-calorie, high-protein diet has been recommended to you. Your registered dietitian nutritionist (RDN) may have recommended this diet because you are having difficulty eating enough calories throughout the day, you have lost weight, and/or you need to add protein to your diet. Sometimes you may not feel like eating, even if you know the importance of good nutrition. The recommendations in this handout can help you with the following: Regaining your strength and energy Keeping your body healthy Healing and recovering from surgery or illness and fighting infection Tips: Schedule Your Meals and Snacks Several small meals and snacks are often better tolerated and digested than large meals. Strategies Plan to eat 3 meals and 3 snacks daily. Experiment with timing meals to find out when you have a larger appetite. Appetite may be greatest in the morning after not eating all night so you may prefer to eat your larger meals and snacks in the morning and at lunch. Breakfast-type foods are often better tolerated so eat foods such as eggs, pancakes, waffles and cereal for any meal or snack. Carry snacks with you so you are prepared to eat every 2 to 3 hours. Determine what works best for you if your body's cues for feeling hungry or full are not working. Eat a small meal or snack even if you don't feel hungry. Set a timer to remind you when it is time to eat. Take a walk before you eat (with health care provider's approval). Light or moderate physical activity can help you maintain muscle and increase your appetite. Make Eating Enjoyable Taking steps to make the experience enjoyable may help to increase your interest in eating and improve your appetite. Strategies: Eat with others whenever possible. Include your favorite foods to make meals more enjoyable. Try new foods. Save your beverage for the end of the meal so that you have more room for  food before you get full. Add Calories to Your Meals and Snacks Try adding calorie-dense foods so that each bite provides more nutrition. Strategies Drink milk, chocolate milk, soy milk, or smoothies instead of low-calorie beverages such as diet drinks or water. Cook with milk or soy milk instead of water when making dishes such as hot cereal, cocoa, or pudding. Add jelly, jam, honey, butter or margarine to bread and crackers. Add jam or fruit to ice cream and as a topping over cake. Mix dried fruit, nuts, granola, honey, or dry cereal with yogurt or hot cereals. Enjoy snacks such as milkshakes, smoothies, pudding, ice cream, or custard. Blend a fruit smoothie of a banana, frozen berries, milk or soy milk, and 1 tablespoon nonfat powdered milk or protein powder. Add Protein to Your Meals and Snacks Choose at least one protein food at each meal and snack to increase your daily intake. Strategies Add  cup nonfat dry milk powder or protein powder to make a high-protein milk to drink or to use in recipes that call for milk. Vanilla or peppermint extract or unsweetened cocoa powder could help to boost the flavor. Add hard-cooked eggs, leftover meat, grated cheese, canned beans or tofu to noodles, rice, salads, sandwiches, soups, casseroles, pasta, tuna and other mixed dishes. Add powdered milk or protein powder to hot cereals, meatloaf, casseroles, scrambled eggs, sauces, cream soups, and shakes. Add beans and lentils to salads, soups, casseroles, and vegetable dishes. Eat cottage cheese or yogurt, especially Greek yogurt, with fruit as a snack or dessert. Eat peanut or other nut butters on crackers, bread, toast,  waffles, apples, bananas or celery sticks. Add it to milkshakes, smoothies, or desserts. Consider a ready-made protein shake. Your RDN will make recommendations. Add Fats to Your Meals and Snacks Try adding fats to your meals and snacks. Fat provides more calories in fewer bites than  carbohydrate or protein and adds flavors to your foods. Strategies Snack on nuts and seeds or add them to foods like salads, pasta, cereals, yogurt, and ice cream.  Saut or stir-fry vegetables, meats, chicken, fish or tofu in olive or canola oil.  Add olive oil, other vegetable oils, butter or margarine to soups, vegetables, potatoes, cooked cereal, rice, pasta, bread, crackers, pancakes, or waffles. Snack on olives or add to pasta, pizza, or salad. Add avocado or guacamole to your salads, sandwiches, and other entrees. Include fatty fish such as salmon in your weekly meal plan. For general food safety tips, especially for clients with immunocompromised conditions, ask your RDN for the Food Safety Nutrition Therapy handout. Small Meal and Snack Ideas These snacks and meals are recommended when you have to eat but aren't necessarily hungry.  They are good choices because they are high in protein and high in calories.  2 graham crackers 2 tablespoons peanut or other nut butter 1 cup milk 2 slices whole wheat toast topped with:  avocado, mashed Seasoning of your choice   cup Greek yogurt  cup fruit  cup granola 2 deviled egg halves 5 whole wheat crackers  1 cup cream of tomato soup  grilled cheese sandwich 1 toasted waffle topped with: 2 tablespoons peanut or nut butter 1 tablespoon jam  Trail mix made with:  cup nuts  cup dried fruit  cup cold cereal, any variety  cup oatmeal or cream of wheat cereal 1 tablespoon peanut or nut butter  cup diced fruit   High-Calorie, High-Protein Sample 1-Day Menu Breakfast 1 egg, scrambled 1 ounce cheddar cheese 1 English muffin, whole wheat 1 tablespoon margarine 1 tablespoon jam  cup orange juice, fortified with calcium and vitamin D  Morning Snack 1 tablespoon peanut butter 1 banana 1 cup 1% milk  Lunch Tuna salad sandwich made with: 2 slices bread, whole wheat 3 ounces tuna mixed with: 1 tablespoon mayonnaise  cup pudding   Afternoon Snack  cup hummus  cup carrots 1 pita  Evening Meal Enchilada casserole made with: 2 corn tortillas 3 ounces ground beef, cooked  cup black beans, cooked  cup corn, cooked 1 ounce grated cheddar cheese  cup enchilada sauce  avocado, sliced, topping for enchilada 1 tablespoon sour cream, topping for enchilada Salad:  cup lettuce, shredded  cup tomatoes, chopped, for salad 1 tablespoon olive oil and vinegar dressing, for salad  Evening Snack  cup Greek yogurt  cup blueberries  cup granola

## 2022-11-10 NOTE — Progress Notes (Addendum)
Progress Note   LOS: 2 days   Chief Complaint: Decompensated cirrhosis   Subjective   Patient states overall she is doing well.  Tolerating diet without difficulty.  Abdominal pain is improved.   Objective   Vital signs in last 24 hours: Temp:  [97.5 F (36.4 C)-98.8 F (37.1 C)] 98.8 F (37.1 C) (11/09 0818) Pulse Rate:  [86-99] 92 (11/09 0818) Resp:  [15-22] 17 (11/09 0818) BP: (89-115)/(56-79) 103/69 (11/09 0818) SpO2:  [93 %-100 %] 96 % (11/09 0818) Last BM Date : 11/09/22 Last BM recorded by nurses in past 5 days Stool Type: Type 5 (Soft blobs with clear-cut edges) (11/10/2022  6:55 AM)  General:   female in no acute distress, jaundiced, icterus sclera Heart:  Regular rate and rhythm; no murmurs Pulm: Clear anteriorly; no wheezing Abdomen: Distended, soft, normal bowel sounds, nontender. Extremities:  No edema Neurologic:  Alert and  oriented x4;  No focal deficits.  Psych:  Cooperative. Normal mood and affect.  Intake/Output from previous day: 11/08 0701 - 11/09 0700 In: 588.5 [I.V.:500; IV Piggyback:88.5] Out: 5 [Stool:5] Intake/Output this shift: No intake/output data recorded.  Studies/Results: IR Paracentesis  Result Date: 11/09/2022 INDICATION: 47 year old female with history of jaundice, recurrent ascites. Paracentesis requested. EXAM: ULTRASOUND GUIDED THERAPEUTIC PARACENTESIS MEDICATIONS: 10 mL 1% lidocaine COMPLICATIONS: None immediate. PROCEDURE: Informed written consent was obtained from the patient after a discussion of the risks, benefits and alternatives to treatment. A timeout was performed prior to the initiation of the procedure. Initial ultrasound scanning demonstrates a small amount of ascites within the right lower abdominal quadrant. The right lower abdomen was prepped and draped in the usual sterile fashion. 1% lidocaine was used for local anesthesia. Following this, a 19 gauge, 7-cm, Yueh catheter was introduced. An ultrasound image was saved  for documentation purposes. The paracentesis was performed. The catheter was removed and a dressing was applied. The patient tolerated the procedure well without immediate post procedural complication. FINDINGS: A total of approximately 1.8 liters of bright yellow fluid was removed. IMPRESSION: Successful ultrasound-guided paracentesis yielding 1.8 liters of peritoneal fluid. Performed by: Loyce Dys PA-C Electronically Signed   By: Gilmer Mor D.O.   On: 11/09/2022 10:04   DG Chest Port 1 View  Result Date: 11/09/2022 CLINICAL DATA:  47 year old female with shortness of breath. EXAM: PORTABLE CHEST 1 VIEW COMPARISON:  Chest CT 07/13/2015. FINDINGS: Portable AP semi upright view at 0656 hours. Mediastinal contours remain normal. Lower lung volumes. Visualized tracheal air column is within normal limits. Patchy bibasilar opacity most resembling atelectasis. No pneumothorax, pulmonary edema, pleural effusion or consolidation. Negative visible osseous structures. Paucity bowel gas in the visible abdomen. IMPRESSION: Lower lung volumes with basilar atelectasis. Electronically Signed   By: Odessa Fleming M.D.   On: 11/09/2022 07:58   CT ABDOMEN PELVIS W CONTRAST  Result Date: 11/08/2022 CLINICAL DATA:  Acute generalized abdominal pain, jaundice. EXAM: CT ABDOMEN AND PELVIS WITH CONTRAST TECHNIQUE: Multidetector CT imaging of the abdomen and pelvis was performed using the standard protocol following bolus administration of intravenous contrast. RADIATION DOSE REDUCTION: This exam was performed according to the departmental dose-optimization program which includes automated exposure control, adjustment of the mA and/or kV according to patient size and/or use of iterative reconstruction technique. CONTRAST:  75mL OMNIPAQUE IOHEXOL 350 MG/ML SOLN COMPARISON:  None Available. FINDINGS: Lower chest: Minimal bibasilar subsegmental atelectasis. Hepatobiliary: Minimal cholelithiasis. No biliary dilatation. Minimal nodular  hepatic contours are noted suggesting hepatic cirrhosis. Slightly heterogeneous  hepatic parenchyma is noted, and underlying metastatic disease cannot be excluded. Pancreas: Unremarkable. No pancreatic ductal dilatation or surrounding inflammatory changes. Spleen: Mild splenomegaly is noted. Adrenals/Urinary Tract: Adrenal glands are unremarkable. Kidneys are normal, without renal calculi, focal lesion, or hydronephrosis. Bladder is unremarkable. Stomach/Bowel: Stomach is unremarkable. No abnormal bowel dilatation is noted. Wall thickening of cecum is noted as well as descending and sigmoid colon concerning for infectious or inflammatory colitis. Vascular/Lymphatic: No significant vascular findings are present. No enlarged abdominal or pelvic lymph nodes. Reproductive: Uterus and bilateral adnexa are unremarkable. Other: Mild ascites is noted. Musculoskeletal: No acute or significant osseous findings. IMPRESSION: Wall thickening of ascending, descending and sigmoid colon is noted concerning for infectious or inflammatory colitis. Mildly heterogeneous appearance of hepatic parenchyma is noted most likely related to cirrhosis, but underlying metastatic disease cannot be excluded. MRI may be considered for further evaluation. Findings consistent with hepatic cirrhosis with mild splenomegaly and mild ascites. Minimal cholelithiasis. Electronically Signed   By: Lupita Raider M.D.   On: 11/08/2022 15:58    Lab Results: Recent Labs    11/09/22 0051 11/09/22 0945 11/10/22 0408  WBC 6.4 6.8 5.3  HGB 6.6* 9.0* 8.2*  HCT 19.5* 26.1* 23.3*  PLT 105* 132* 108*   BMET Recent Labs    11/09/22 0051 11/09/22 0945 11/10/22 0408  NA 127* 127* 127*  K 3.6 3.7 3.2*  CL 91* 90* 93*  CO2 23 24 24   GLUCOSE 97 96 91  BUN 6 8 9   CREATININE 1.06* 1.22* 1.24*  CALCIUM 7.7* 8.3* 8.2*   LFT Recent Labs    11/10/22 0408  PROT 6.1*  ALBUMIN 2.2*  AST 81*  ALT 23  ALKPHOS 68  BILITOT 5.1*   PT/INR Recent  Labs    11/08/22 0758 11/09/22 0051  LABPROT 18.6* 18.8*  INR 1.5* 1.5*     Scheduled Meds:  feeding supplement  237 mL Oral BID BM   folic acid  1 mg Oral Daily   levothyroxine  100 mcg Oral QAC breakfast   metroNIDAZOLE  500 mg Oral Q12H   midodrine  10 mg Oral TID WC   multivitamin with minerals  1 tablet Oral Daily   nadolol  20 mg Oral Daily   pantoprazole (PROTONIX) IV  40 mg Intravenous Q12H   spironolactone  50 mg Oral Daily   thiamine  100 mg Oral Daily   Or   thiamine  100 mg Intravenous Daily   Continuous Infusions:  cefTRIAXone (ROCEPHIN)  IV     magnesium sulfate bolus IVPB 4 g (11/10/22 0824)      Patient profile:   47 year old with past medical history significant for hypothyroidism, newly diagnosed decompensated cirrhosis likely EtOH etiology undergoing workup with IR and planning for transjugular liver biopsy next week  She underwent diagnostic paracentesis 10/10/2022 Total protein: <3g/dL Albumin: <2.9B/MW Cell count: increased monocytes, 93 Cytology is negative for malignancy SAAG 2.0g/dL   Impression:   Decompensated cirrhosis Hepatitis serologies nonreactive Elevated LFTs dating back to 2013 MELD 3.0: 27 at 11/10/2022 4:08 AM SAAG of paracentesis 10/09 indicating portal hypertension Paracentesis with 1.8 L fluid 11/8, unfortunately no labs were sent. EGD 11/8 with grade 1 varices, LA grade C reflux esophagitis, portal hypertensive gastropathy, normal duodenum.  Biopsies pending - Suspect alcoholic etiology with history of alcohol use.  However, serologic evaluation to be collected including HFE gene secondary to elevated iron and ferritin - AFP pending - Continue to trend LFTs - Serial INR, daily -  monitor daily MELD - planning for outpatient transjugular liver biopsy next week with IR - Broad spectrum abx (with empiric coverage for SBP) + albumin bolus - spironolactone 100mg /lasix 40mg  - PPI twice daily - IR unavailable to do diagnostic  paracentesis as they feel there is not a window available to tap after yesterday's paracentesis. They will reconsider on Monday - patient to follow up outpatient in our office in 4-6 weeks   Symptomatic anemia Positive Hemoccult Hgb 8.2, stable Iron 119, saturation 100% Ferritin 448 Folate 3.2, B12 653 EGD with grade 1 varices and portal hypertensive gastropathy as well as LA grade C reflux esophagitis - Replace folate - Continue daily CBC and transfuse as needed to maintain HGB > 7     Plan:     Principal Problem:   Colitis Active Problems:   HTN (hypertension), benign   Hypothyroidism   Cirrhosis of liver (HCC)   Symptomatic anemia   Folate deficiency   Hyponatremia   Hypokalemia   Alcoholic cirrhosis of liver with ascites (HCC)   Hypomagnesemia   Bayley M McMichael  11/10/2022, 10:04 AM     Attending physician's note   I have taken a history, reviewed the chart and examined the patient. I performed a substantive portion of this encounter, including complete performance of at least one of the key components, in conjunction with the APP. I agree with the APP's note, impression and recommendations.    47 year old female with new diagnosis of cirrhosis, likely etiology alcohol induced, workup to exclude any underlying liver disease pending She was scheduled for liver biopsy as outpatient, can hold for now and decide based on labs and pending liver workup MELD 3.0: 27 at 11/10/2022  4:08 AM  Decompensated with ascites and volume overload: S/p paracentesis Ascites fluid was not sent for cell count to exclude SBP Empirically treat with 5-day course of ceftriaxone while hospitalized, can transition to oral antibiotics on discharge Continue low-dose diuretics, Lasix 20 mg daily and spironolactone 50 mg daily Follow-up BMP  Small grade 1 esophageal varices and portal hypertensive gastropathy: On nadolol 20 mg daily for secondary prophylaxis  Hepatic encephalopathy: Lactulose  20 g 3 times daily with 3 soft bowel movements per day  Colitis?  No pericolonic stranding, noted mild thickening of colon likely due to underdistention and bowel wall edema due to severe hypoalbuminemia and cirrhosis  Discussed alcohol cessation in detail High-protein diet to improve protein calorie malnutrition  We will arrange follow-up in GI office on discharge within 3 to 4 weeks Follow-up with PCP in 1 to 2 weeks Repeat BMP 1 week after discharge  Patient is eager to go home, okay to DC home from GI standpoint    The patient was provided an opportunity to ask questions and all were answered. The patient agreed with the plan and demonstrated an understanding of the instructions.   Iona Beard , MD [phone]

## 2022-11-10 NOTE — Plan of Care (Signed)

## 2022-11-10 NOTE — Progress Notes (Signed)
PROGRESS NOTE        PATIENT DETAILS Name: Marisa Contreras Age: 47 y.o. Sex: female Date of Birth: Mar 19, 1975 Admit Date: 11/08/2022 Admitting Physician Therisa Doyne, MD WUJ:[id number], Bryon Lions, MD  Brief Summary: Patient is a 47 y.o.  female with history of alcoholic liver cirrhosis-sent to ED by IR for incidental finding of hemoglobin of 5.8.  Significant events: 11/7>> admit to [facility]  Significant studies: 11/7>> CT abdomen/pelvis: Colitis involving ascending/descending/sigmoid colon.  Significant microbiology data: 11/7>>  COVID PCR: Negative  Procedures: 11/8>> 1.8 L paracentesis by IR 11/8>> EGD: Grade 1 esophageal varices, LA grade C reflux esophagitis.  Consults: None  Subjective: Lying comfortably in bed-denies any chest pain or shortness of breath.  Objective: Vitals: Blood pressure 103/69, pulse 92, temperature 98.8 F (37.1 C), temperature source Oral, resp. rate 17, height 5\' 7"  (1.702 m), weight 58 kg, SpO2 96%.   Exam: Gen Exam:Alert awake-not in any distress HEENT:atraumatic, normocephalic Chest: B/L clear to auscultation anteriorly CVS:S1S2 regular Abdomen: Soft-distended-but not tense.  No abdominal pain. 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 BP on the lower side-starting midodrine along with Aldactone/Lasix Alpha-fetoprotein/hemochromatosis workup/ANA pending-but thought to have alcohol as the etiology for cirrhosis.  Colitis No diarrhea but frequent stools Unclear if this is colitis or bowel thickening from hyperbilirubinemia Transition to Rocephin/Flagyl x 5 days total.  Macrocytic anemia No overt GI bleeding S/p EGD- grade 1 varices S/p 3 units of PRBC-Hb stable Follow CBC Continue PPI  Hypothyroidism Synthroid  Hyponatremia Secondary to volume overload/ascites Starting diuretics Follow chemistries.  Nutrition Status: Nutrition Problem: Inadequate oral intake Etiology: poor appetite, early satiety, nausea Signs/Symptoms: per patient/family report Interventions: Ensure Enlive (each supplement provides 350kcal and 20 grams of protein), MVI, Education  BMI: Estimated body mass index is 20.03 kg/m as calculated from the following:   Height as of this encounter: 5\' 7"  (1.702 m).   Weight as of this encounter: 58 kg.   Code status:   Code Status: Full Code   DVT Prophylaxis: SCDs Start: 11/08/22 2031   Family Communication: None at bedside   Disposition Plan: Status is: Inpatient Remains inpatient appropriate because: Severity of illness   Planned Discharge Destination:Home   Diet: Diet Order             Diet full liquid Room service appropriate? Yes; Fluid consistency: Thin  Diet effective now                     Antimicrobial agents: Anti-infectives (From admission,  onward)    Start     Dose/Rate Route Frequency Ordered Stop   11/09/22 0300  piperacillin-tazobactam (ZOSYN) IVPB 3.375 g        3.375 g 12.5 mL/hr over 240 Minutes Intravenous Every 8 hours 11/08/22 2008     11/08/22 1845  piperacillin-tazobactam (ZOSYN) IVPB 3.375 g        3.375 g 100 mL/hr over 30 Minutes Intravenous  Once 11/08/22 1842 11/08/22 1938        MEDICATIONS: Scheduled  Meds:  feeding supplement  237 mL Oral BID BM   folic acid  1 mg Oral Daily   furosemide  20 mg Oral Once   levothyroxine  100 mcg Oral QAC breakfast   midodrine  10 mg Oral TID WC   multivitamin with minerals  1 tablet Oral Daily   nadolol  20 mg Oral Daily   pantoprazole (PROTONIX) IV  40 mg Intravenous Q12H   spironolactone  50 mg Oral Daily   thiamine  100 mg Oral Daily   Or   thiamine  100 mg Intravenous Daily   Continuous Infusions:  magnesium sulfate bolus IVPB 4 g (11/10/22 0824)   piperacillin-tazobactam (ZOSYN)  IV 12.5 mL/hr at 11/10/22 0655   PRN Meds:.fentaNYL (SUBLIMAZE) injection, LORazepam **OR** LORazepam, melatonin, ondansetron **OR** ondansetron (ZOFRAN) IV, mouth rinse   I have personally reviewed following labs and imaging studies  LABORATORY DATA: CBC: Recent Labs  Lab 11/08/22 0758 11/08/22 0948 11/08/22 2149 11/09/22 0051 11/09/22 0945 11/10/22 0408  WBC 7.1  --  6.3 6.4 6.8 5.3  NEUTROABS  --   --  4.5  --   --   --   HGB 5.8* 5.8* 6.6* 6.6* 9.0* 8.2*  HCT 17.2* 17.0* 19.7* 19.5* 26.1* 23.3*  MCV 108.2*  --  99.0 99.5 97.8 97.5  PLT 140*  --  115* 105* 132* 108*    Basic Metabolic Panel: Recent Labs  Lab 11/08/22 0758 11/08/22 0948 11/08/22 2149 11/09/22 0051 11/09/22 0945 11/10/22 0408  NA 126* 129* 126* 127* 127* 127*  K 3.2* 3.5 3.7 3.6 3.7 3.2*  CL 88* 87* 93* 91* 90* 93*  CO2 23  --  22 23 24 24   GLUCOSE 99 97 95 97 96 91  BUN <5* 3* 7 6 8 9   CREATININE 1.06* 1.10* 1.06* 1.06* 1.22* 1.24*  CALCIUM 7.7*  --  7.5* 7.7* 8.3* 8.2*  MG  --   --  0.9* 0.9* 1.5* 1.3*  PHOS  --   --  3.9 3.9  --   --     GFR: Estimated Creatinine Clearance: 52.5 mL/min (A) (by C-G formula based on SCr of 1.24 mg/dL (H)).  Liver Function Tests: Recent Labs  Lab 11/08/22 0758 11/08/22 2149 11/09/22 0051 11/09/22 0945 11/10/22 0408  AST 122* 96* 92* 96* 81*  ALT 32 26 24 28 23   ALKPHOS 102 84 80 91 68  BILITOT 5.7* 6.7* 6.6* 9.2* 5.1*   PROT 7.6 6.4* 6.6 7.4 6.1*  ALBUMIN 2.6* 2.1* 2.4* 2.6* 2.2*   Recent Labs  Lab 11/08/22 1207  LIPASE 53*   Recent Labs  Lab 11/08/22 2149  AMMONIA 38*    Coagulation Profile: Recent Labs  Lab 11/08/22 0758 11/09/22 0051  INR 1.5* 1.5*    Cardiac Enzymes: Recent Labs  Lab 11/08/22 2149  CKTOTAL 48    BNP (last 3 results) No results for input(s): "PROBNP" in the last 8760 hours.  Lipid Profile: Recent Labs  11/09/22 0051  CHOL 139  HDL 22*  LDLCALC 100*  TRIG 86  CHOLHDL 6.3    Thyroid Function Tests: Recent Labs    11/08/22 2149  TSH 12.981*  FREET4 1.18*    Anemia Panel: Recent Labs    11/08/22 1203 11/08/22 1207  VITAMINB12 653  --   FOLATE 3.2*  --   FERRITIN  --  448*  TIBC  --  119*  IRON  --  119  RETICCTPCT 4.2*  --     Urine analysis:    Component Value Date/Time   COLORURINE AMBER (A) 11/09/2022 0450   APPEARANCEUR CLEAR 11/09/2022 0450   LABSPEC 1.042 (H) 11/09/2022 0450   PHURINE 5.0 11/09/2022 0450   GLUCOSEU NEGATIVE 11/09/2022 0450   HGBUR NEGATIVE 11/09/2022 0450   BILIRUBINUR NEGATIVE 11/09/2022 0450   KETONESUR 5 (A) 11/09/2022 0450   PROTEINUR NEGATIVE 11/09/2022 0450   NITRITE NEGATIVE 11/09/2022 0450   LEUKOCYTESUR NEGATIVE 11/09/2022 0450    Sepsis Labs: Lactic Acid, Venous    Component Value Date/Time   LATICACIDVEN 0.8 11/09/2022 0050    MICROBIOLOGY: Recent Results (from the past 240 hour(s))  SARS Coronavirus 2 by RT PCR (hospital order, performed in [facility] hospital lab) *cepheid single result test* Anterior Nasal Swab     Status: None   Collection Time: 11/08/22  7:52 PM   Specimen: Anterior Nasal Swab  Result Value Ref Range Status   SARS Coronavirus 2 by RT PCR NEGATIVE NEGATIVE Final    Comment: Performed at [facility], [address] [place], [place] [address]    RADIOLOGY STUDIES/RESULTS: IR Paracentesis  Result Date: 11/09/2022 INDICATION: 47 year old female with  history of jaundice, recurrent ascites. Paracentesis requested. EXAM: ULTRASOUND GUIDED THERAPEUTIC PARACENTESIS MEDICATIONS: 10 mL 1% lidocaine COMPLICATIONS: None immediate. PROCEDURE: Informed written consent was obtained from the patient after a discussion of the risks, benefits and alternatives to treatment. A timeout was performed prior to the initiation of the procedure. Initial ultrasound scanning demonstrates a small amount of ascites within the right lower abdominal quadrant. The right lower abdomen was prepped and draped in the usual sterile fashion. 1% lidocaine was used for local anesthesia. Following this, a 19 gauge, 7-cm, Yueh catheter was introduced. An ultrasound image was saved for documentation purposes. The paracentesis was performed. The catheter was removed and a dressing was applied. The patient tolerated the procedure well without immediate post procedural complication. FINDINGS: A total of approximately 1.8 liters of bright yellow fluid was removed. IMPRESSION: Successful ultrasound-guided paracentesis yielding 1.8 liters of peritoneal fluid. Performed by: Loyce Dys PA-C Electronically Signed   By: Gilmer Mor D.O.   On: 11/09/2022 10:04   DG Chest Port 1 View  Result Date: 11/09/2022 CLINICAL DATA:  47 year old female with shortness of breath. EXAM: PORTABLE CHEST 1 VIEW COMPARISON:  Chest CT 07/13/2015. FINDINGS: Portable AP semi upright view at 0656 hours. Mediastinal contours remain normal. Lower lung volumes. Visualized tracheal air column is within normal limits. Patchy bibasilar opacity most resembling atelectasis. No pneumothorax, pulmonary edema, pleural effusion or consolidation. Negative visible osseous structures. Paucity bowel gas in the visible abdomen. IMPRESSION: Lower lung volumes with basilar atelectasis. Electronically Signed   By: Odessa Fleming M.D.   On: 11/09/2022 07:58   CT ABDOMEN PELVIS W CONTRAST  Result Date: 11/08/2022 CLINICAL DATA:  Acute generalized  abdominal pain, jaundice. EXAM: CT ABDOMEN AND PELVIS WITH CONTRAST TECHNIQUE: Multidetector CT imaging of the abdomen and pelvis was performed using  the standard protocol following bolus administration of intravenous contrast. RADIATION DOSE REDUCTION: This exam was performed according to the departmental dose-optimization program which includes automated exposure control, adjustment of the mA and/or kV according to patient size and/or use of iterative reconstruction technique. CONTRAST:  75mL OMNIPAQUE IOHEXOL 350 MG/ML SOLN COMPARISON:  None Available. FINDINGS: Lower chest: Minimal bibasilar subsegmental atelectasis. Hepatobiliary: Minimal cholelithiasis. No biliary dilatation. Minimal nodular hepatic contours are noted suggesting hepatic cirrhosis. Slightly heterogeneous hepatic parenchyma is noted, and underlying metastatic disease cannot be excluded. Pancreas: Unremarkable. No pancreatic ductal dilatation or surrounding inflammatory changes. Spleen: Mild splenomegaly is noted. Adrenals/Urinary Tract: Adrenal glands are unremarkable. Kidneys are normal, without renal calculi, focal lesion, or hydronephrosis. Bladder is unremarkable. Stomach/Bowel: Stomach is unremarkable. No abnormal bowel dilatation is noted. Wall thickening of cecum is noted as well as descending and sigmoid colon concerning for infectious or inflammatory colitis. Vascular/Lymphatic: No significant vascular findings are present. No enlarged abdominal or pelvic lymph nodes. Reproductive: Uterus and bilateral adnexa are unremarkable. Other: Mild ascites is noted. Musculoskeletal: No acute or significant osseous findings. IMPRESSION: Wall thickening of ascending, descending and sigmoid colon is noted concerning for infectious or inflammatory colitis. Mildly heterogeneous appearance of hepatic parenchyma is noted most likely related to cirrhosis, but underlying metastatic disease cannot be excluded. MRI may be considered for further evaluation.  Findings consistent with hepatic cirrhosis with mild splenomegaly and mild ascites. Minimal cholelithiasis. Electronically Signed   By: Lupita Raider M.D.   On: 11/08/2022 15:58     LOS: 2 days   Jeoffrey Massed, MD  [facility]    To contact the attending provider between 7A-7P or the covering provider during after hours 7P-7A, please log into the web site [email or web address] and access using universal  password for that web site. If you do not have the password, please call the hospital operator.  11/10/2022, 9:12 AM

## 2022-11-11 ENCOUNTER — Encounter (HOSPITAL_COMMUNITY): Payer: Self-pay | Admitting: Gastroenterology

## 2022-11-11 DIAGNOSIS — I10 Essential (primary) hypertension: Secondary | ICD-10-CM | POA: Diagnosis not present

## 2022-11-11 DIAGNOSIS — K7460 Unspecified cirrhosis of liver: Secondary | ICD-10-CM | POA: Diagnosis not present

## 2022-11-11 DIAGNOSIS — R188 Other ascites: Secondary | ICD-10-CM | POA: Diagnosis not present

## 2022-11-11 DIAGNOSIS — K529 Noninfective gastroenteritis and colitis, unspecified: Secondary | ICD-10-CM | POA: Diagnosis not present

## 2022-11-11 LAB — COMPREHENSIVE METABOLIC PANEL
ALT: 29 U/L (ref 0–44)
AST: 135 U/L — ABNORMAL HIGH (ref 15–41)
Albumin: 2.2 g/dL — ABNORMAL LOW (ref 3.5–5.0)
Alkaline Phosphatase: 73 U/L (ref 38–126)
Anion gap: 7 (ref 5–15)
BUN: 9 mg/dL (ref 6–20)
CO2: 24 mmol/L (ref 22–32)
Calcium: 7.8 mg/dL — ABNORMAL LOW (ref 8.9–10.3)
Chloride: 94 mmol/L — ABNORMAL LOW (ref 98–111)
Creatinine, Ser: 1.49 mg/dL — ABNORMAL HIGH (ref 0.44–1.00)
GFR, Estimated: 44 mL/min — ABNORMAL LOW (ref >60)
Glucose, Bld: 95 mg/dL (ref 70–99)
Potassium: 3.3 mmol/L — ABNORMAL LOW (ref 3.5–5.1)
Sodium: 125 mmol/L — ABNORMAL LOW (ref 135–145)
Total Bilirubin: 3.9 mg/dL — ABNORMAL HIGH (ref <1.2)
Total Protein: 5.9 g/dL — ABNORMAL LOW (ref 6.5–8.1)

## 2022-11-11 LAB — CBC
HCT: 21.7 % — ABNORMAL LOW (ref 36.0–46.0)
Hemoglobin: 7.5 g/dL — ABNORMAL LOW (ref 12.0–15.0)
MCH: 34.6 pg — ABNORMAL HIGH (ref 26.0–34.0)
MCHC: 34.6 g/dL (ref 30.0–36.0)
MCV: 100 fL (ref 80.0–100.0)
Platelets: 106 10*3/uL — ABNORMAL LOW (ref 150–400)
RBC: 2.17 MIL/uL — ABNORMAL LOW (ref 3.87–5.11)
RDW: 18.5 % — ABNORMAL HIGH (ref 11.5–15.5)
WBC: 5.8 10*3/uL (ref 4.0–10.5)
nRBC: 0 % (ref 0.0–0.2)

## 2022-11-11 LAB — AFP TUMOR MARKER: AFP, Serum, Tumor Marker: 4.5 ng/mL (ref 0.0–6.4)

## 2022-11-11 LAB — CALCIUM, IONIZED: Calcium, Ionized, Serum: 4.7 mg/dL (ref 4.5–5.6)

## 2022-11-11 LAB — MAGNESIUM: Magnesium: 1.8 mg/dL (ref 1.7–2.4)

## 2022-11-11 MED ORDER — SPIRONOLACTONE 25 MG PO TABS: 50.0000 mg | ORAL_TABLET | Freq: Every day | ORAL | Status: DC

## 2022-11-11 MED ORDER — NADOLOL 20 MG PO TABS
20.0000 mg | ORAL_TABLET | Freq: Every day | ORAL | Status: DC
  Administered 2022-11-12 – 2022-11-13 (×2): 20 mg via ORAL
  Filled 2022-11-11 (×2): qty 1

## 2022-11-11 MED ORDER — MELATONIN 5 MG PO TABS
10.0000 mg | ORAL_TABLET | Freq: Every evening | ORAL | Status: DC | PRN
  Administered 2022-11-11: 10 mg via ORAL
  Filled 2022-11-11: qty 2

## 2022-11-11 MED ORDER — ALBUMIN HUMAN 5 % IV SOLN
25.0000 g | Freq: Three times a day (TID) | INTRAVENOUS | Status: AC
  Administered 2022-11-11 – 2022-11-12 (×3): 25 g via INTRAVENOUS
  Filled 2022-11-11 (×3): qty 500
  Filled 2022-11-11: qty 250
  Filled 2022-11-11: qty 500

## 2022-11-11 MED ORDER — POTASSIUM CHLORIDE CRYS ER 20 MEQ PO TBCR
40.0000 meq | EXTENDED_RELEASE_TABLET | Freq: Once | ORAL | Status: AC
  Administered 2022-11-11: 40 meq via ORAL
  Filled 2022-11-11: qty 2

## 2022-11-11 NOTE — Progress Notes (Signed)
Patient unhappy with care. Patient states, I am losing trust because I do not know what is being done to me." Patient consistently ripped her tele monitor off,  she removed IVA, saying she is trying to get rid of the peeping IV. Patient at this time is alert and oriented but at this time uncooperative with care. Patient educated on the reason for the medicine and IVA. CN aware and spoke with patient. Dr. Lazarus Salines aware and will come talk with patient.

## 2022-11-11 NOTE — Plan of Care (Signed)

## 2022-11-11 NOTE — Plan of Care (Signed)
  Problem: Education: Goal: Knowledge of General Education information will improve Description Including pain rating scale, medication(s)/side effects and non-pharmacologic comfort measures Outcome: Progressing   

## 2022-11-11 NOTE — Progress Notes (Signed)
PROGRESS NOTE        PATIENT DETAILS Name: Marisa Contreras Age: 47 y.o. Sex: female Date of Birth: 16-May-1975 Admit Date: 11/08/2022 Admitting Physician Therisa Doyne, MD VHQ:IONGE, Bryon Lions, MD  Brief Summary: Patient is a 47 y.o.  female with history of alcoholic liver cirrhosis-sent to ED by IR for incidental finding of hemoglobin of 5.8.  Significant events: 11/7>> admit to [facility]  Significant studies: 11/7>> CT abdomen/pelvis: Colitis involving ascending/descending/sigmoid colon.  Significant microbiology data: 11/7>>  COVID PCR: Negative  Procedures: 11/8>> 1.8 L paracentesis by IR 11/8>> EGD: Grade 1 esophageal varices, LA grade C reflux esophagitis.  Consults: None  Subjective: Anxious to go home-however BP systolic in the 80s today.  Objective: Vitals: Blood pressure (!) 84/61, pulse 84, temperature 97.9 F (36.6 C), temperature source Oral, resp. rate (!) 21, height 5\' 7"  (1.702 m), weight 58 kg, SpO2 98%.   Exam: Gen Exam:Alert awake-not in any distress HEENT:atraumatic, normocephalic Chest: B/L clear to auscultation anteriorly CVS:S1S2 regular Abdomen:soft non tender,distended-but not tense Extremities:+ edema Neurology: Non focal Skin: no rash  Pertinent Labs/Radiology:    Latest Ref Rng & Units 11/11/2022    3:21 AM 11/10/2022    4:08 AM 11/09/2022    9:45 AM  CBC  WBC 4.0 - 10.5 K/uL 5.8  5.3  6.8   Hemoglobin 12.0 - 15.0 g/dL 7.5  8.2  9.0   Hematocrit 36.0 - 46.0 % 21.7  23.3  26.1   Platelets 150 - 400 K/uL 106  108  132     Lab Results  Component Value Date   NA 125 (L) 11/11/2022   K 3.3 (L) 11/11/2022   CL 94 (L) 11/11/2022   CO2 24 11/11/2022      Assessment/Plan: Alcoholic liver cirrhosis Recurrent ascites 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 Alpha-fetoprotein/hemochromatosis workup/ANA pending-but thought to have alcohol as the etiology for cirrhosis.  Colitis No diarrhea but frequent stools Unclear if this is colitis or bowel thickening from hyperbilirubinemia Transition to Rocephin/Flagyl x 5 days total.  Macrocytic anemia No overt GI bleeding S/p EGD- grade 1 varices S/p 3 units of PRBC-Hb stable but trending down  Repeat CBC in a.m.  Continue PPI  Hypothyroidism Synthroid  Hyponatremia Secondary to volume overload/ascites Asymptomatic but decreasing Fluid restriction emphasized Holding diuretics due to hypotension   Nutrition Status: Nutrition Problem: Inadequate oral intake Etiology: poor appetite, early satiety, nausea Signs/Symptoms: per patient/family report Interventions: Ensure Enlive (each supplement provides 350kcal and 20 grams of protein), MVI, Education  BMI: Estimated body mass index is 20.03 kg/m as calculated from the following:   Height as of this encounter: 5\' 7"  (1.702 m).   Weight as of this encounter: 58 kg.   Code status:   Code Status: Full Code   DVT Prophylaxis: SCDs Start: 11/08/22 2031   Family Communication: None at bedside   Disposition Plan: Status is: Inpatient Remains inpatient appropriate because: Severity of illness   Planned Discharge Destination:Home   Diet: Diet Order             Diet heart healthy/carb modified Fluid consistency: Thin; Fluid restriction: 1200 mL Fluid  Diet  effective now                     Antimicrobial agents: Anti-infectives (From admission, onward)    Start     Dose/Rate Route Frequency Ordered Stop   11/10/22 1015  cefTRIAXone (ROCEPHIN) 2 g in sodium chloride 0.9 % 100 mL IVPB        2 g 200 mL/hr over 30 Minutes Intravenous Every 24 hours  11/10/22 0924     11/10/22 1015  metroNIDAZOLE (FLAGYL) tablet 500 mg        500 mg Oral Every 12 hours 11/10/22 0924     11/09/22 0300  piperacillin-tazobactam (ZOSYN) IVPB 3.375 g  Status:  Discontinued        3.375 g 12.5 mL/hr over 240 Minutes Intravenous Every 8 hours 11/08/22 2008 11/10/22 0924   11/08/22 1845  piperacillin-tazobactam (ZOSYN) IVPB 3.375 g        3.375 g 100 mL/hr over 30 Minutes Intravenous  Once 11/08/22 1842 11/08/22 1938        MEDICATIONS: Scheduled Meds:  feeding supplement  237 mL Oral BID BM   folic acid  1 mg Oral Daily   levothyroxine  100 mcg Oral QAC breakfast   metroNIDAZOLE  500 mg Oral Q12H   midodrine  10 mg Oral TID WC   multivitamin with minerals  1 tablet Oral Daily   [START ON 11/12/2022] nadolol  20 mg Oral Daily   pantoprazole (PROTONIX) IV  40 mg Intravenous Q12H   [START ON 11/12/2022] spironolactone  50 mg Oral Daily   thiamine  100 mg Oral Daily   Or   thiamine  100 mg Intravenous Daily   Continuous Infusions:  albumin human 25 g (11/11/22 1026)   cefTRIAXone (ROCEPHIN)  IV 2 g (11/11/22 0833)   PRN Meds:.fentaNYL (SUBLIMAZE) injection, LORazepam **OR** LORazepam, melatonin, ondansetron **OR** ondansetron (ZOFRAN) IV, mouth rinse   I have personally reviewed following labs and imaging studies  LABORATORY DATA: CBC: Recent Labs  Lab 11/08/22 2149 11/09/22 0051 11/09/22 0945 11/10/22 0408 11/11/22 0321  WBC 6.3 6.4 6.8 5.3 5.8  NEUTROABS 4.5  --   --   --   --   HGB 6.6* 6.6* 9.0* 8.2* 7.5*  HCT 19.7* 19.5* 26.1* 23.3* 21.7*  MCV 99.0 99.5 97.8 97.5 100.0  PLT 115* 105* 132* 108* 106*    Basic Metabolic Panel: Recent Labs  Lab 11/08/22 2149 11/09/22 0051 11/09/22 0945 11/10/22 0408 11/11/22 0321  NA 126* 127* 127* 127* 125*  K 3.7 3.6 3.7 3.2* 3.3*  CL 93* 91* 90* 93* 94*  CO2 22 23 24 24 24   GLUCOSE 95 97 96 91 95  BUN 7 6 8 9 9   CREATININE 1.06* 1.06* 1.22* 1.24* 1.49*  CALCIUM 7.5* 7.7* 8.3* 8.2*  7.8*  MG 0.9* 0.9* 1.5* 1.3* 1.8  PHOS 3.9 3.9  --   --   --     GFR: Estimated Creatinine Clearance: 43.7 mL/min (A) (by C-G formula based on SCr of 1.49 mg/dL (H)).  Liver Function Tests: Recent Labs  Lab 11/08/22 2149 11/09/22 0051 11/09/22 0945 11/10/22 0408 11/11/22 0321  AST 96* 92* 96* 81* 135*  ALT 26 24 28 23 29   ALKPHOS 84 80 91 68 73  BILITOT 6.7* 6.6* 9.2* 5.1* 3.9*  PROT 6.4* 6.6 7.4 6.1* 5.9*  ALBUMIN 2.1* 2.4* 2.6* 2.2* 2.2*   Recent Labs  Lab 11/08/22 1207  LIPASE 53*   Recent Labs  Lab 11/08/22 2149  AMMONIA 38*    Coagulation Profile: Recent Labs  Lab 11/08/22 0758 11/09/22 0051  INR 1.5* 1.5*    Cardiac Enzymes: Recent Labs  Lab 11/08/22 2149  CKTOTAL 48    BNP (last 3 results) No results for input(s): "PROBNP" in the last 8760 hours.  Lipid Profile: Recent Labs    11/09/22 0051  CHOL 139  HDL 22*  LDLCALC 100*  TRIG 86  CHOLHDL 6.3    Thyroid Function Tests: Recent Labs    11/08/22 2149  TSH 12.981*  FREET4 1.18*    Anemia Panel: Recent Labs    11/08/22 1203 11/08/22 1207  VITAMINB12 653  --   FOLATE 3.2*  --   FERRITIN  --  448*  TIBC  --  119*  IRON  --  119  RETICCTPCT 4.2*  --     Urine analysis:    Component Value Date/Time   COLORURINE AMBER (A) 11/09/2022 0450   APPEARANCEUR CLEAR 11/09/2022 0450   LABSPEC 1.042 (H) 11/09/2022 0450   PHURINE 5.0 11/09/2022 0450   GLUCOSEU NEGATIVE 11/09/2022 0450   HGBUR NEGATIVE 11/09/2022 0450   BILIRUBINUR NEGATIVE 11/09/2022 0450   KETONESUR 5 (A) 11/09/2022 0450   PROTEINUR NEGATIVE 11/09/2022 0450   NITRITE NEGATIVE 11/09/2022 0450   LEUKOCYTESUR NEGATIVE 11/09/2022 0450    Sepsis Labs: Lactic Acid, Venous    Component Value Date/Time   LATICACIDVEN 0.8 11/09/2022 0050    MICROBIOLOGY: Recent Results (from the past 240 hour(s))  SARS Coronavirus 2 by RT PCR (hospital order, performed in [facility] hospital lab) *cepheid single result test*  Anterior Nasal Swab     Status: None   Collection Time: 11/08/22  7:52 PM   Specimen: Anterior Nasal Swab  Result Value Ref Range Status   SARS Coronavirus 2 by RT PCR NEGATIVE NEGATIVE Final    Comment: Performed at [facility], [address], [place], [place] [address]    RADIOLOGY STUDIES/RESULTS: No results found.   LOS: 3 days   Jeoffrey Massed, MD  [facility]    To contact the attending provider between 7A-7P or the covering provider during after hours 7P-7A, please log into the web site [email or web address] and access using universal [place] password for that web site. If you do not have the password, please call the hospital operator.  11/11/2022, 10:46 AM

## 2022-11-12 ENCOUNTER — Inpatient Hospital Stay (HOSPITAL_COMMUNITY): Payer: 59

## 2022-11-12 DIAGNOSIS — K7682 Hepatic encephalopathy: Secondary | ICD-10-CM | POA: Diagnosis not present

## 2022-11-12 DIAGNOSIS — K7460 Unspecified cirrhosis of liver: Secondary | ICD-10-CM | POA: Diagnosis not present

## 2022-11-12 DIAGNOSIS — K766 Portal hypertension: Secondary | ICD-10-CM

## 2022-11-12 DIAGNOSIS — D649 Anemia, unspecified: Secondary | ICD-10-CM | POA: Diagnosis not present

## 2022-11-12 DIAGNOSIS — K3189 Other diseases of stomach and duodenum: Secondary | ICD-10-CM

## 2022-11-12 DIAGNOSIS — R188 Other ascites: Secondary | ICD-10-CM | POA: Diagnosis not present

## 2022-11-12 DIAGNOSIS — I10 Essential (primary) hypertension: Secondary | ICD-10-CM | POA: Diagnosis not present

## 2022-11-12 DIAGNOSIS — K7031 Alcoholic cirrhosis of liver with ascites: Secondary | ICD-10-CM | POA: Diagnosis not present

## 2022-11-12 DIAGNOSIS — K2100 Gastro-esophageal reflux disease with esophagitis, without bleeding: Secondary | ICD-10-CM

## 2022-11-12 DIAGNOSIS — K529 Noninfective gastroenteritis and colitis, unspecified: Secondary | ICD-10-CM | POA: Diagnosis not present

## 2022-11-12 LAB — CBC
HCT: 21.4 % — ABNORMAL LOW (ref 36.0–46.0)
Hemoglobin: 7.1 g/dL — ABNORMAL LOW (ref 12.0–15.0)
MCH: 34 pg (ref 26.0–34.0)
MCHC: 33.2 g/dL (ref 30.0–36.0)
MCV: 102.4 fL — ABNORMAL HIGH (ref 80.0–100.0)
Platelets: 101 10*3/uL — ABNORMAL LOW (ref 150–400)
RBC: 2.09 MIL/uL — ABNORMAL LOW (ref 3.87–5.11)
RDW: 18 % — ABNORMAL HIGH (ref 11.5–15.5)
WBC: 4.6 10*3/uL (ref 4.0–10.5)
nRBC: 0 % (ref 0.0–0.2)

## 2022-11-12 LAB — BASIC METABOLIC PANEL
Anion gap: 11 (ref 5–15)
BUN: 9 mg/dL (ref 6–20)
CO2: 18 mmol/L — ABNORMAL LOW (ref 22–32)
Calcium: 8 mg/dL — ABNORMAL LOW (ref 8.9–10.3)
Chloride: 98 mmol/L (ref 98–111)
Creatinine, Ser: 1.32 mg/dL — ABNORMAL HIGH (ref 0.44–1.00)
GFR, Estimated: 51 mL/min — ABNORMAL LOW (ref >60)
Glucose, Bld: 92 mg/dL (ref 70–99)
Potassium: 3.7 mmol/L (ref 3.5–5.1)
Sodium: 127 mmol/L — ABNORMAL LOW (ref 135–145)

## 2022-11-12 LAB — ALPHA-1-ANTITRYPSIN: A-1 Antitrypsin, Ser: 183 mg/dL (ref 101–187)

## 2022-11-12 LAB — ANTI-SMOOTH MUSCLE ANTIBODY, IGG: F-Actin IgG: 9 U (ref 0–19)

## 2022-11-12 LAB — CERULOPLASMIN: Ceruloplasmin: 15.9 mg/dL — ABNORMAL LOW (ref 19.0–39.0)

## 2022-11-12 LAB — SURGICAL PATHOLOGY

## 2022-11-12 MED ORDER — LORAZEPAM 0.5 MG PO TABS
0.5000 mg | ORAL_TABLET | Freq: Three times a day (TID) | ORAL | Status: DC | PRN
  Administered 2022-11-12 – 2022-11-13 (×5): 0.5 mg via ORAL
  Filled 2022-11-12 (×5): qty 1

## 2022-11-12 MED ORDER — PANTOPRAZOLE SODIUM 40 MG PO TBEC
40.0000 mg | DELAYED_RELEASE_TABLET | Freq: Two times a day (BID) | ORAL | Status: DC
  Administered 2022-11-12 – 2022-11-14 (×4): 40 mg via ORAL
  Filled 2022-11-12 (×4): qty 1

## 2022-11-12 MED ORDER — ALBUMIN HUMAN 5 % IV SOLN
25.0000 g | Freq: Three times a day (TID) | INTRAVENOUS | Status: AC
  Administered 2022-11-12 – 2022-11-13 (×3): 25 g via INTRAVENOUS
  Filled 2022-11-12 (×3): qty 500

## 2022-11-12 MED ORDER — MELATONIN 5 MG PO TABS
10.0000 mg | ORAL_TABLET | Freq: Every day | ORAL | Status: DC
  Administered 2022-11-12 – 2022-11-13 (×2): 10 mg via ORAL
  Filled 2022-11-12 (×2): qty 2

## 2022-11-12 MED ORDER — LACTULOSE 10 GM/15ML PO SOLN: 30.0000 g | Freq: Three times a day (TID) | ORAL | Status: DC | PRN

## 2022-11-12 MED ORDER — LIDOCAINE HCL 1 % IJ SOLN
INTRAMUSCULAR | Status: AC
  Filled 2022-11-12: qty 20

## 2022-11-12 MED ORDER — LACTULOSE 10 GM/15ML PO SOLN
20.0000 g | Freq: Two times a day (BID) | ORAL | Status: DC
Start: 2022-11-12 — End: 2022-11-13
  Administered 2022-11-12 – 2022-11-13 (×3): 20 g via ORAL
  Filled 2022-11-12 (×3): qty 30

## 2022-11-12 MED ORDER — LORAZEPAM 0.5 MG PO TABS
0.5000 mg | ORAL_TABLET | Freq: Once | ORAL | Status: AC
  Administered 2022-11-12: 0.5 mg via ORAL
  Filled 2022-11-12: qty 1

## 2022-11-12 NOTE — Plan of Care (Signed)
  Problem: Clinical Measurements: Goal: Will remain free from infection Outcome: Progressing Goal: Respiratory complications will improve Outcome: Progressing   Problem: Nutrition: Goal: Adequate nutrition will be maintained Outcome: Progressing   Problem: Elimination: Goal: Will not experience complications related to urinary retention Outcome: Progressing

## 2022-11-12 NOTE — Plan of Care (Signed)
Patient brought to IR for possible paracentesis - she declines to undergo procedure today stating this was not discussed with her by primary team.  Limited US of all 4 quadrants shows scant ascites this morning, questionable if sufficient fluid for paracentesis at this time. Will defer paracentesis until patient is agreeable to proceed.  Lynnette Caffey, PA-C

## 2022-11-12 NOTE — TOC Initial Note (Signed)
Transition of Care [facility]) - Initial/Assessment Note    Patient Details  Name: Marisa Contreras MRN: [id number] Date of Birth: 07-Oct-1975  [facility] CM/SW Contact:    Michaela Corner, LCSWA Phone Number: 11/12/2022, 11:04 AM  Clinical Narrative:   CSW met pt at bedside for ETOH consult. Ms. Shird stated she does not believe she has a problem. CSW offered substance use resources explaining she can keep it and look through it. Pt was agreeable and had CSW place packet on table in room.         Activities of Daily Living   ADL Screening (condition at time of admission) Independently performs ADLs?: Yes (appropriate for developmental age) Is the patient deaf or have difficulty hearing?: No Does the patient have difficulty seeing, even when wearing glasses/contacts?: No Does the patient have difficulty concentrating, remembering, or making decisions?: No   Admission diagnosis:  Colitis [K52.9] Jaundice [R17] Gastrointestinal hemorrhage, unspecified gastrointestinal hemorrhage type [K92.2] Anemia, unspecified type [D64.9] Patient Active Problem List   Diagnosis Date Noted   Jaundice 11/10/2022   Encephalopathy, hepatic (HCC) 11/10/2022   Hypomagnesemia 11/09/2022   Colitis 11/08/2022   Cirrhosis of liver (HCC) 11/08/2022   Symptomatic anemia 11/08/2022   Folate deficiency 11/08/2022   Hyponatremia 11/08/2022   Hypokalemia 11/08/2022   Alcoholic cirrhosis of liver with ascites (HCC) 11/08/2022   Pain in right eye 07/25/2018   Acute non-recurrent maxillary sinusitis 02/14/2017   Concussion syndrome 10/09/2016   Syncope 10/09/2016   Abnormal EKG 11/16/2015   Rib fracture 08/14/2015   Chest pain 08/02/2015   Pleurisy 04/28/2015   Muscle spasms of neck 04/27/2014   Hypothyroidism 01/05/2013   HTN (hypertension), benign 11/13/2012   Elevated liver function tests 04/16/2011   History of chicken pox 09/08/2010   PCP:  Bradd Canary, MD Pharmacy:    [facility] [phone] - Closed - [place], [place] - [address] [place] [address] Phone: [phone] Fax: [phone]  [id number] [place], [place] - [address] AT [facility] [address] [place] [address] Phone: [phone] Fax: [phone]     Social Determinants of Health (SDOH) Social History: SDOH Screenings   Food Insecurity: No Food Insecurity (11/09/2022)  Housing: Low Risk  (11/09/2022)  Transportation Needs: No Transportation Needs (11/09/2022)  Utilities: Not At Risk (11/09/2022)  Tobacco Use: Medium Risk (11/09/2022)   SDOH Interventions:     Readmission Risk Interventions     No data to display

## 2022-11-12 NOTE — Plan of Care (Signed)

## 2022-11-12 NOTE — Anesthesia Postprocedure Evaluation (Signed)
Anesthesia Post Note  Patient: Marisa Contreras  Procedure(s) Performed: ESOPHAGOGASTRODUODENOSCOPY (EGD) BIOPSY     Patient location during evaluation: Endoscopy Anesthesia Type: MAC Level of consciousness: awake and alert Pain management: pain level controlled Vital Signs Assessment: post-procedure vital signs reviewed and stable Respiratory status: spontaneous breathing, nonlabored ventilation, respiratory function stable and patient connected to nasal cannula oxygen Cardiovascular status: stable and blood pressure returned to baseline Postop Assessment: no apparent nausea or vomiting Anesthetic complications: no   No notable events documented.  Last Vitals:  Vitals:   11/12/22 0547 11/12/22 0550  BP: (!) 88/64 95/69  Pulse: 71 71  Resp: 17 18  Temp: 36.5 C   SpO2: 94% 93%    Last Pain:  Vitals:   11/12/22 0547  TempSrc: Oral  PainSc:                  Eleno Weimar S

## 2022-11-12 NOTE — Progress Notes (Signed)
PROGRESS NOTE        PATIENT DETAILS Name: Marisa Contreras Age: 47 y.o. Sex: female Date of Birth: September 01, 1975 Admit Date: 11/08/2022 Admitting Physician Therisa Doyne, MD NWG:[id number], Bryon Lions, MD  Brief Summary: Patient is a 47 y.o.  female with history of alcoholic liver cirrhosis-sent to ED by IR for incidental finding of hemoglobin of 5.8.  Significant events: 11/7>> admit to [facility]  Significant studies: 11/7>> CT abdomen/pelvis: Colitis involving ascending/descending/sigmoid colon.  Significant microbiology data: 11/7>>  COVID PCR: Negative  Procedures: 11/8>> 1.8 L paracentesis by IR 11/8>> EGD: Grade 1 esophageal varices, LA grade C reflux esophagitis.  Consults: None  Subjective: Somewhat anxious last night-calm/quiet this morning.  Claims his stools are greenish/darkish in color but it is not melanotic nor is it bloody.  Denies any abdominal pain.  Objective: Vitals: Blood pressure 101/66, pulse 70, temperature (!) 97.4 F (36.3 C), temperature source Oral, resp. rate 20, height 5\' 7"  (1.702 m), weight 58 kg, SpO2 100%.   Exam: Gen Exam:Alert awake-not in any distress HEENT:atraumatic, normocephalic Chest: B/L clear to auscultation anteriorly CVS:S1S2 regular Abdomen:soft non tender, mildly distended but not tense. 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  Repeat paracentesis attempted 11/11-scant ascites present. Diuretics started on 11/9-developed hypotension-now on midodrine/albumin.   Cautiously resume nadolol today Alpha-fetoprotein/hemochromatosis workup/ANA/anti-smooth muscle antibody pending-but thought to have alcohol as the etiology for cirrhosis.  Colitis No diarrhea but frequent stools Unclear if this is colitis or bowel thickening from hyperbilirubinemia Transition to Rocephin/Flagyl x 5 days total.  AKI Mild-likely hemodynamically mediated-resolving with supportive care Follow renal function periodically.  Macrocytic anemia No overt GI bleeding S/p EGD- grade 1 varices S/p 3 units of PRBC-Hb stable but trending down  FOBT positive-will discuss with GI to see if she requires a colonoscopy as well Continue PPI.  Hypothyroidism Synthroid  Hyponatremia Secondary to volume overload/ascites Holding diuretics due to soft blood pressure Mild hyponatremia continues-better than yesterday-but she is asymptomatic.  Anxiety Anxious last night-asking for anxiety medication today-will place on as needed lorazepam for now.  Nutrition Status: Nutrition Problem: Inadequate oral intake Etiology: poor appetite, early satiety, nausea Signs/Symptoms: per patient/family report Interventions: Ensure Enlive (each supplement provides 350kcal and 20 grams of protein), MVI, Education  BMI: Estimated body mass index is 20.03 kg/m as calculated from the following:   Height as of this encounter: 5\' 7"  (1.702 m).   Weight as of this encounter: 58 kg.   Code status:   Code Status: Full Code   DVT Prophylaxis: SCDs Start:  11/08/22 2031   Family Communication: None at bedside   Disposition Plan: Status is: Inpatient Remains inpatient appropriate because: Severity of illness   Planned Discharge Destination:Home   Diet: Diet  Order             Diet heart healthy/carb modified Fluid consistency: Thin; Fluid restriction: 1200 mL Fluid  Diet effective now                     Antimicrobial agents: Anti-infectives (From admission, onward)    Start     Dose/Rate Route Frequency Ordered Stop   11/10/22 1015  cefTRIAXone (ROCEPHIN) 2 g in sodium chloride 0.9 % 100 mL IVPB        2 g 200 mL/hr over 30 Minutes Intravenous Every 24 hours 11/10/22 0924     11/10/22 1015  metroNIDAZOLE (FLAGYL) tablet 500 mg        500 mg Oral Every 12 hours 11/10/22 0924     11/09/22 0300  piperacillin-tazobactam (ZOSYN) IVPB 3.375 g  Status:  Discontinued        3.375 g 12.5 mL/hr over 240 Minutes Intravenous Every 8 hours 11/08/22 2008 11/10/22 0924   11/08/22 1845  piperacillin-tazobactam (ZOSYN) IVPB 3.375 g        3.375 g 100 mL/hr over 30 Minutes Intravenous  Once 11/08/22 1842 11/08/22 1938        MEDICATIONS: Scheduled Meds:  feeding supplement  237 mL Oral BID BM   folic acid  1 mg Oral Daily   lactulose  20 g Oral BID   levothyroxine  100 mcg Oral QAC breakfast   melatonin  10 mg Oral QHS   metroNIDAZOLE  500 mg Oral Q12H   midodrine  10 mg Oral TID WC   multivitamin with minerals  1 tablet Oral Daily   nadolol  20 mg Oral Daily   pantoprazole  40 mg Oral BID AC   thiamine  100 mg Oral Daily   Continuous Infusions:  albumin human 25 g (11/12/22 0859)   cefTRIAXone (ROCEPHIN)  IV Stopped (11/11/22 1742)   PRN Meds:.LORazepam, ondansetron **OR** ondansetron (ZOFRAN) IV, mouth rinse   I have personally reviewed following labs and imaging studies  LABORATORY DATA: CBC: Recent Labs  Lab 11/08/22 2149 11/09/22 0051 11/09/22 0945 11/10/22 0408 11/11/22 0321 11/12/22 0438  WBC 6.3 6.4 6.8 5.3 5.8 4.6  NEUTROABS 4.5  --   --   --   --   --   HGB 6.6* 6.6* 9.0* 8.2* 7.5* 7.1*  HCT 19.7* 19.5* 26.1* 23.3* 21.7* 21.4*  MCV 99.0 99.5 97.8 97.5 100.0 102.4*  PLT 115* 105* 132* 108* 106* 101*     Basic Metabolic Panel: Recent Labs  Lab 11/08/22 2149 11/09/22 0051 11/09/22 0945 11/10/22 0408 11/11/22 0321 11/12/22 0438  NA 126* 127* 127* 127* 125* 127*  K 3.7 3.6 3.7 3.2* 3.3* 3.7  CL 93* 91* 90* 93* 94* 98  CO2 22 23 24 24 24  18*  GLUCOSE 95 97 96 91 95 92  BUN 7 6 8 9 9 9   CREATININE 1.06* 1.06* 1.22* 1.24* 1.49* 1.32*  CALCIUM 7.5* 7.7* 8.3* 8.2* 7.8* 8.0*  MG 0.9* 0.9* 1.5* 1.3* 1.8  --   PHOS 3.9 3.9  --   --   --   --     GFR: Estimated Creatinine Clearance: 49.3 mL/min (A) (by C-G formula based on SCr of 1.32 mg/dL (H)).  Liver Function Tests: Recent Labs  Lab  11/08/22 2149 11/09/22 0051 11/09/22 0945 11/10/22 0408 11/11/22 0321  AST 96* 92* 96* 81* 135*  ALT 26 24 28 23 29   ALKPHOS 84 80 91 68 73  BILITOT 6.7* 6.6* 9.2* 5.1* 3.9*  PROT 6.4* 6.6 7.4 6.1* 5.9*  ALBUMIN 2.1* 2.4* 2.6* 2.2* 2.2*   Recent Labs  Lab 11/08/22 1207  LIPASE 53*   Recent Labs  Lab 11/08/22 2149  AMMONIA 38*    Coagulation Profile: Recent Labs  Lab 11/08/22 0758 11/09/22 0051  INR 1.5* 1.5*    Cardiac Enzymes: Recent Labs  Lab 11/08/22 2149  CKTOTAL 48    BNP (last 3 results) No results for input(s): "PROBNP" in the last 8760 hours.  Lipid Profile: No results for input(s): "CHOL", "HDL", "LDLCALC", "TRIG", "CHOLHDL", "LDLDIRECT" in the last 72 hours.   Thyroid Function Tests: No results for input(s): "TSH", "T4TOTAL", "FREET4", "T3FREE", "THYROIDAB" in the last 72 hours.   Anemia Panel: No results for input(s): "VITAMINB12", "FOLATE", "FERRITIN", "TIBC", "IRON", "RETICCTPCT" in the last 72 hours.   Urine analysis:    Component Value Date/Time   COLORURINE AMBER (A) 11/09/2022 0450   APPEARANCEUR CLEAR 11/09/2022 0450   LABSPEC 1.042 (H) 11/09/2022 0450   PHURINE 5.0 11/09/2022 0450   GLUCOSEU NEGATIVE 11/09/2022 0450   HGBUR NEGATIVE 11/09/2022 0450   BILIRUBINUR NEGATIVE 11/09/2022 0450   KETONESUR 5 (A) 11/09/2022 0450   PROTEINUR  NEGATIVE 11/09/2022 0450   NITRITE NEGATIVE 11/09/2022 0450   LEUKOCYTESUR NEGATIVE 11/09/2022 0450    Sepsis Labs: Lactic Acid, Venous    Component Value Date/Time   LATICACIDVEN 0.8 11/09/2022 0050    MICROBIOLOGY: Recent Results (from the past 240 hour(s))  SARS Coronavirus 2 by RT PCR (hospital order, performed in [facility] hospital lab) *cepheid single result test* Anterior Nasal Swab     Status: None   Collection Time: 11/08/22  7:52 PM   Specimen: Anterior Nasal Swab  Result Value Ref Range Status   SARS Coronavirus 2 by RT PCR NEGATIVE NEGATIVE Final    Comment: Performed at [facility], [address], [place], [place] [address]    RADIOLOGY STUDIES/RESULTS: No results found.   LOS: 4 days   Jeoffrey Massed, MD  [facility]    To contact the attending provider between 7A-7P or the covering provider during after hours 7P-7A, please log into the web site [email or web address] and access using universal [place] password for that web site. If you do not have the password, please call the hospital operator.  11/12/2022, 10:58 AM

## 2022-11-12 NOTE — Progress Notes (Signed)
Daily Progress Note  DOA: 11/08/2022 Hospital Day: 5  Chief Complaint: Anemia, FOBT+, recently diagnosed decompensated cirrhosis  ASSESSMENT    Brief Narrative:  Marisa Contreras is a 47 y.o. year old female with a history of  cirrhosis with portal HTN, hypothyroidism, cholelithiasis,  Admitted 11/7 from IR department where she was scheduled to have a tranjugular liver biopsy (ordered by GYN for workup of ascites) but was found to be severely anemic. GI saw in consult 11/8.   Recently diagnosed cirrhosis with ascites, small esophageal varices, portal hypertensive gastropathy, mild splenomegaly / thrombocytopenia. Etiology of cirrhosis not yet determined but possibly Etoh  MELD 3.0: 27 at 11/10/2022 4:08 AM .  Hepatic serologic workup in progress.  S/p 1.8 liter paracentesis on 11/8 but no labs sent. Taken to IR to repeat paracentesis today but she declined procedure and [place] of all 4 quadrants showed only scant ascites so may have been insufficient amount anyway.   Symptomatic anemia ( hgb 5.8 on admission)  / FOBT+ Hgb improved to 9 post 3 u prbcs but has since drifted over last few days to 7.1.   Folate deficiency  Abnormal colon on CT scan with contrast - Wall thickening of ascending, descending and sigmoid colon concerning for infectious or inflammatory colitis. Findings could also be 2/2 to portal hypertension / colopathy --Appears stool studies ordered but never collected. She is having small amounts of loose stool but hard to know if just lactulose related. Denies abdominal pain, WBC normal.   Reflux esophagitis, LA grade C   Principal Problem:   Colitis Active Problems:   HTN (hypertension), benign   Hypothyroidism   Cirrhosis of liver (HCC)   Symptomatic anemia   Folate deficiency   Hyponatremia   Hypokalemia   Alcoholic cirrhosis of liver with ascites (HCC)   Hypomagnesemia   Jaundice   Encephalopathy, hepatic (HCC)   PLAN   --Continue Nadolol 20 mg daily -  HR is 70 and BP 101/66 --Continue BID PPI --Continue BID lactulose --Continue Folvite --Has been started on midodrine for hypotension. Diuretics stopped yesterday. Will need to resume diuretics when stable. --Continue 2 gram sodium restricted diet --Awaiting ANA, ceruloplasmin, alpha 1 antitrypsin , hemochromatosis studies (Ferritin in 400s and iron sat 100%) --Outpatient colonoscopy  --Given ascites / heme + stool would continue a total of 5 days of antibiotics ( especially since unable to get paracentesis with fluid studies)    Subjective   Trying to take a shower to wash stool off legs. No abdominal pain. Just passed a small amount of liquid brown stool.    Objective   EGD 11/8 Grade 1 varices in lower 1/3 of esophagus. LA Grade 3 esophagitis without bleeding. Moderate portal hypertensive gastropathy, biopsies taken. Examined duodenum normal.   FINAL MICROSCOPIC DIAGNOSIS:   A. STOMACH, BIOPSY:       Gastric antral mucosa with reactive epithelial changes, lamina  propria edema and vascular dilation suggestive for portal hypertensive  gastropathy.      Gastric oxyntic mucosa without significant diagnostic alteration.       No H. pylori identified on HE stain.       Negative for intestinal metaplasia or dysplasia.   Cirrhosis evaluation:  HCV ab negative Hep B surf ag negative.  ASMA negative *Celiac serologies not obtained but duodenum appeared normal on EGD done as part of anemia evaluation HCC screening: normal AFP. No reported focal liver lesion on CT with contrast this admission.   10/10/22 ascitic fluid  cytology negative for malignant cells  Recent Labs    11/10/22 0408 11/11/22 0321 11/12/22 0438  WBC 5.3 5.8 4.6  HGB 8.2* 7.5* 7.1*  HCT 23.3* 21.7* 21.4*  PLT 108* 106* 101*   BMET Recent Labs    11/10/22 0408 11/11/22 0321 11/12/22 0438  NA 127* 125* 127*  K 3.2* 3.3* 3.7  CL 93* 94* 98  CO2 24 24 18*  GLUCOSE 91 95 92  BUN 9 9 9   CREATININE 1.24*  1.49* 1.32*  CALCIUM 8.2* 7.8* 8.0*   LFT Recent Labs    11/11/22 0321  PROT 5.9*  ALBUMIN 2.2*  AST 135*  ALT 29  ALKPHOS 73  BILITOT 3.9*   PT/INR No results for input(s): "LABPROT", "INR" in the last 72 hours.   Imaging:  IR Paracentesis INDICATION: 47 year old female with history of jaundice, recurrent ascites. Paracentesis requested.  EXAM: ULTRASOUND GUIDED THERAPEUTIC PARACENTESIS  MEDICATIONS: 10 mL 1% lidocaine  COMPLICATIONS: None immediate.  PROCEDURE: Informed written consent was obtained from the patient after a discussion of the risks, benefits and alternatives to treatment. A timeout was performed prior to the initiation of the procedure.  Initial ultrasound scanning demonstrates a small amount of ascites within the right lower abdominal quadrant. The right lower abdomen was prepped and draped in the usual sterile fashion. 1% lidocaine was used for local anesthesia.  Following this, a 19 gauge, 7-cm, Yueh catheter was introduced. An ultrasound image was saved for documentation purposes. The paracentesis was performed. The catheter was removed and a dressing was applied. The patient tolerated the procedure well without immediate post procedural complication.  FINDINGS: A total of approximately 1.8 liters of bright yellow fluid was removed.  IMPRESSION: Successful ultrasound-guided paracentesis yielding 1.8 liters of peritoneal fluid.  Performed by: Loyce Dys PA-C  Electronically Signed   By: Gilmer Mor D.O.   On: 11/09/2022 10:04 DG Chest Port 1 View CLINICAL DATA:  47 year old female with shortness of breath.  EXAM: PORTABLE CHEST 1 VIEW  COMPARISON:  Chest CT 07/13/2015.  FINDINGS: Portable AP semi upright view at 0656 hours. Mediastinal contours remain normal. Lower lung volumes. Visualized tracheal air column is within normal limits. Patchy bibasilar opacity most resembling atelectasis. No pneumothorax, pulmonary  edema, pleural effusion or consolidation.  Negative visible osseous structures. Paucity bowel gas in the visible abdomen.  IMPRESSION: Lower lung volumes with basilar atelectasis.  Electronically Signed   By: Odessa Fleming M.D.   On: 11/09/2022 07:58     Scheduled inpatient medications:   feeding supplement  237 mL Oral BID BM   folic acid  1 mg Oral Daily   lactulose  20 g Oral BID   levothyroxine  100 mcg Oral QAC breakfast   melatonin  10 mg Oral QHS   metroNIDAZOLE  500 mg Oral Q12H   midodrine  10 mg Oral TID WC   multivitamin with minerals  1 tablet Oral Daily   nadolol  20 mg Oral Daily   pantoprazole  40 mg Oral BID AC   thiamine  100 mg Oral Daily   Continuous inpatient infusions:   albumin human 25 g (11/12/22 0859)   cefTRIAXone (ROCEPHIN)  IV 2 g (11/12/22 1059)   PRN inpatient medications: LORazepam, ondansetron **OR** ondansetron (ZOFRAN) IV, mouth rinse  Vital signs in last 24 hours: Temp:  [97.4 F (36.3 C)-98.2 F (36.8 C)] 97.4 F (36.3 C) (11/11 0905) Pulse Rate:  [67-71] 70 (11/11 0905) Resp:  [16-26] 20 (11/11  0865) BP: (88-101)/(61-69) 101/66 (11/11 0905) SpO2:  [91 %-100 %] 100 % (11/11 0905) Last BM Date : 11/09/22  Intake/Output Summary (Last 24 hours) at 11/12/2022 1417 Last data filed at 11/12/2022 0906 Gross per 24 hour  Intake 1213.7 ml  Output --  Net 1213.7 ml    Intake/Output from previous day: 11/10 0701 - 11/11 0700 In: 560 [IV Piggyback:560] Out: -  Intake/Output this shift: Total I/O In: 653.7 [IV Piggyback:653.7] Out: -    Physical Exam:  General: Alert female in NAD. Standing up trying to get in the shower.  Heart:  Regular rate and rhythm.  Pulmonary: Normal respiratory effort Abdomen: Soft, distended, non-tender Neurologic: Alert and oriented Ext: bilateral lower extremity edema.    LOS: 4 days   Willette Cluster ,NP 11/12/2022, 2:17 PM

## 2022-11-12 NOTE — Progress Notes (Addendum)
Mobility Specialist Progress Note;   11/12/22 1015  Mobility  Activity Ambulated independently in hallway  Level of Assistance Modified independent, requires aide device or extra time  Assistive Device None  Distance Ambulated (ft) 100 ft  Activity Response Tolerated well  Mobility Referral Yes  $Mobility charge 1 Mobility  Mobility Specialist Start Time (ACUTE ONLY) 1015  Mobility Specialist Stop Time (ACUTE ONLY) 1025  Mobility Specialist Time Calculation (min) (ACUTE ONLY) 10 min   Pt agreeable to mobility. Required no physical assistance during ambulation after setup. Only c/o feet being swollen, otherwise no c/o. Did display some fatigue. Pt back in bed with all needs met.   Caesar Bookman Mobility Specialist Please contact via SecureChat or Rehab Office [phone]

## 2022-11-12 NOTE — Progress Notes (Signed)
Patient refusing vitals and remain intermittently uncooperative with cares.

## 2022-11-13 ENCOUNTER — Inpatient Hospital Stay (HOSPITAL_COMMUNITY): Payer: 59

## 2022-11-13 DIAGNOSIS — K529 Noninfective gastroenteritis and colitis, unspecified: Secondary | ICD-10-CM | POA: Diagnosis not present

## 2022-11-13 DIAGNOSIS — I10 Essential (primary) hypertension: Secondary | ICD-10-CM | POA: Diagnosis not present

## 2022-11-13 DIAGNOSIS — K7460 Unspecified cirrhosis of liver: Secondary | ICD-10-CM | POA: Diagnosis not present

## 2022-11-13 DIAGNOSIS — R188 Other ascites: Secondary | ICD-10-CM | POA: Diagnosis not present

## 2022-11-13 DIAGNOSIS — I8510 Secondary esophageal varices without bleeding: Secondary | ICD-10-CM | POA: Diagnosis not present

## 2022-11-13 DIAGNOSIS — K766 Portal hypertension: Secondary | ICD-10-CM | POA: Diagnosis not present

## 2022-11-13 DIAGNOSIS — D649 Anemia, unspecified: Secondary | ICD-10-CM | POA: Diagnosis not present

## 2022-11-13 HISTORY — PX: IR PARACENTESIS: IMG2679

## 2022-11-13 LAB — CBC
HCT: 19.6 % — ABNORMAL LOW (ref 36.0–46.0)
Hemoglobin: 6.5 g/dL — CL (ref 12.0–15.0)
MCH: 33.9 pg (ref 26.0–34.0)
MCHC: 33.2 g/dL (ref 30.0–36.0)
MCV: 102.1 fL — ABNORMAL HIGH (ref 80.0–100.0)
Platelets: 96 10*3/uL — ABNORMAL LOW (ref 150–400)
RBC: 1.92 MIL/uL — ABNORMAL LOW (ref 3.87–5.11)
RDW: 17.7 % — ABNORMAL HIGH (ref 11.5–15.5)
WBC: 3.8 10*3/uL — ABNORMAL LOW (ref 4.0–10.5)
nRBC: 0 % (ref 0.0–0.2)

## 2022-11-13 LAB — GRAM STAIN

## 2022-11-13 LAB — BASIC METABOLIC PANEL
Anion gap: 12 (ref 5–15)
BUN: 7 mg/dL (ref 6–20)
CO2: 21 mmol/L — ABNORMAL LOW (ref 22–32)
Calcium: 8.3 mg/dL — ABNORMAL LOW (ref 8.9–10.3)
Chloride: 97 mmol/L — ABNORMAL LOW (ref 98–111)
Creatinine, Ser: 1.42 mg/dL — ABNORMAL HIGH (ref 0.44–1.00)
GFR, Estimated: 46 mL/min — ABNORMAL LOW (ref >60)
Glucose, Bld: 91 mg/dL (ref 70–99)
Potassium: 3.8 mmol/L (ref 3.5–5.1)
Sodium: 130 mmol/L — ABNORMAL LOW (ref 135–145)

## 2022-11-13 LAB — HEPATIC FUNCTION PANEL
ALT: 28 U/L (ref 0–44)
AST: 110 U/L — ABNORMAL HIGH (ref 15–41)
Albumin: 3 g/dL — ABNORMAL LOW (ref 3.5–5.0)
Alkaline Phosphatase: 62 U/L (ref 38–126)
Bilirubin, Direct: 1.2 mg/dL — ABNORMAL HIGH (ref 0.0–0.2)
Indirect Bilirubin: 2.1 mg/dL — ABNORMAL HIGH (ref 0.3–0.9)
Total Bilirubin: 3.3 mg/dL — ABNORMAL HIGH (ref <1.2)
Total Protein: 6.2 g/dL — ABNORMAL LOW (ref 6.5–8.1)

## 2022-11-13 LAB — ANA W/REFLEX IF POSITIVE: Anti Nuclear Antibody (ANA): NEGATIVE

## 2022-11-13 LAB — BODY FLUID CELL COUNT WITH DIFFERENTIAL
Eos, Fluid: 0 %
Lymphs, Fluid: 27 %
Monocyte-Macrophage-Serous Fluid: 72 % (ref 50–90)
Neutrophil Count, Fluid: 0 % (ref 0–25)
Total Nucleated Cell Count, Fluid: 79 uL (ref 0–1000)

## 2022-11-13 LAB — ALBUMIN, PLEURAL OR PERITONEAL FLUID: Albumin, Fluid: <1.5 g/dL

## 2022-11-13 LAB — PROTEIN, PLEURAL OR PERITONEAL FLUID: Total protein, fluid: <3 g/dL

## 2022-11-13 LAB — PREPARE RBC (CROSSMATCH)

## 2022-11-13 LAB — HEMOGLOBIN AND HEMATOCRIT, BLOOD
HCT: 24.1 % — ABNORMAL LOW (ref 36.0–46.0)
Hemoglobin: 8.2 g/dL — ABNORMAL LOW (ref 12.0–15.0)

## 2022-11-13 LAB — LACTATE DEHYDROGENASE, PLEURAL OR PERITONEAL FLUID: LD, Fluid: 52 U/L — ABNORMAL HIGH (ref 3–23)

## 2022-11-13 MED ORDER — LACTULOSE 10 GM/15ML PO SOLN: 20.0000 g | Freq: Two times a day (BID) | ORAL | Status: DC

## 2022-11-13 MED ORDER — PEG-KCL-NACL-NASULF-NA ASC-C 100 G PO SOLR: 1.0000 | Freq: Once | ORAL | Status: DC

## 2022-11-13 MED ORDER — TRAZODONE HCL 50 MG PO TABS: 50.0000 mg | ORAL_TABLET | Freq: Every evening | ORAL | Status: DC | PRN

## 2022-11-13 MED ORDER — SODIUM CHLORIDE 0.9% IV SOLUTION: Freq: Once | INTRAVENOUS | Status: AC

## 2022-11-13 MED ORDER — LIDOCAINE HCL 1 % IJ SOLN
20.0000 mL | Freq: Once | INTRAMUSCULAR | Status: AC
  Administered 2022-11-13: 8 mL via INTRADERMAL
  Filled 2022-11-13: qty 20

## 2022-11-13 MED ORDER — PEG-KCL-NACL-NASULF-NA ASC-C 100 G PO SOLR
0.5000 | Freq: Once | ORAL | Status: AC
  Administered 2022-11-13: 100 g via ORAL
  Filled 2022-11-13: qty 1

## 2022-11-13 MED ORDER — ALBUMIN HUMAN 5 % IV SOLN
25.0000 g | Freq: Three times a day (TID) | INTRAVENOUS | Status: AC
  Administered 2022-11-13 – 2022-11-14 (×3): 25 g via INTRAVENOUS
  Filled 2022-11-13 (×3): qty 500

## 2022-11-13 MED ORDER — ALBUMIN HUMAN 5 % IV SOLN
25.0000 g | Freq: Three times a day (TID) | INTRAVENOUS | Status: DC
  Filled 2022-11-13 (×2): qty 500

## 2022-11-13 MED ORDER — LIDOCAINE HCL 1 % IJ SOLN
INTRAMUSCULAR | Status: AC
  Filled 2022-11-13: qty 20

## 2022-11-13 NOTE — Progress Notes (Addendum)
Daily Progress Note  DOA: 11/08/2022 Hospital Day: 6  Chief Complaint: Anemia, FOBT+, recently diagnosed decompensated cirrhosis   ASSESSMENT    Brief Narrative:  Marisa Contreras is a 47 y.o. year old female with a history of  cirrhosis with portal HTN, hypothyroidism, cholelithiasis, Admitted 11/7 from IR department where she was scheduled to have a tranjugular liver biopsy (ordered by GYN for workup of ascites) but was found to be severely anemic. GI saw in consult 11/8.   Recently diagnosed cirrhosis with ascites, small esophageal varices, portal hypertensive gastropathy, mild splenomegaly / thrombocytopenia. Etiology of cirrhosis not yet determined but possibly Etoh related MELD 3.0: 27 at 11/10/2022 .  MDF on admission didn't meet criteria for steroids Hepatic serologic workup still in progress- mostly negative but still ruling out hemochromatosis and her ceruloplasmin is slightly low Empirically getting antibiotics since unable to obtain cell counts on paracentesis Today:  Tbili improving ( 3.3), AST 110 / ALT 28.   AKI Cr up again overnight ( 1.32 >> 1.42).    Symptomatic macrocytic anemia ( hgb 5.8 on admission)  / FOBT+ Hgb improved to 9 post 3 u prbcs but has since drifted over last few days to 6.5 in absence of any overt GI bleeding ( per my discussions with her)   Folate deficiency   Abnormal colon on CT scan with contrast - Wall thickening of ascending, descending and sigmoid colon concerning for infectious or inflammatory colitis. Findings could also be 2/2 to portal hypertension / colopathy --Appears stool studies ordered but never collected. No significant diarrhea. Denies abdominal pain, WBC normal. Received 3 days of flagyl   Reflux esophagitis, LA grade C   Principal Problem:   Colitis Active Problems:   HTN (hypertension), benign   Hypothyroidism   Cirrhosis of liver (HCC)   Symptomatic anemia   Folate deficiency   Hyponatremia   Hypokalemia    Alcoholic cirrhosis of liver with ascites (HCC)   Hypomagnesemia   Jaundice   Encephalopathy, hepatic (HCC)   Portal hypertensive gastropathy (HCC)   Gastroesophageal reflux disease with esophagitis   PLAN   --Nadolol on hold ( last dose yesterday) due to hypotension. --Need to resume diuretics but holding for now as creatinine bumped again overnight from 1.32 >> 1.42 --Continue 2 gram sodium restricted diet --Continue BID PPI --Continue Folvite --I will discontinue Rocephin today (has gotten 5 days of antibiotics including Zosyn and Rocephin) --Awaiting hemochromatosis studies  --Will check serum copper vr urinary copper levels.  --am CMET, INR, CBC --Getting 1 uni blood now.  --Colonoscopy tomorrow for worsening anemia. The risks and benefits of colonoscopy with possible polypectomy / biopsies were discussed and the patient agrees to proceed.  -Hold lactulose today. Stools already loose and will be getting bowel prep for colonoscopy today.   Subjective   Feels okay today. No abdominal pain.    Objective   EGD 11/8 Grade 1 varices in lower 1/3 of esophagus. LA Grade 3 esophagitis without bleeding. Moderate portal hypertensive gastropathy, biopsies taken. Examined duodenum normal.    FINAL MICROSCOPIC DIAGNOSIS:   A. STOMACH, BIOPSY:       Gastric antral mucosa with reactive epithelial changes, lamina  propria edema and vascular dilation suggestive for portal hypertensive  gastropathy.      Gastric oxyntic mucosa without significant diagnostic alteration.       No H. pylori identified on HE stain.       Negative for intestinal metaplasia or dysplasia.    Cirrhosis  evaluation:  HCV ab negative, Hep B surf ag negative.  ASMA negative A-1 Antitrypsin normal Ceruloplasmin low at 15.9 ( ref 19 - 39) ANA negative *Celiac serologies not obtained but duodenum appeared normal on EGD done as part of anemia evaluation HCC screening: normal AFP. No reported focal liver lesion on  CT with contrast this admission.   10/10/22 ascitic fluid cytology negative for malignant cells  Recent Labs    11/11/22 0321 11/12/22 0438 11/13/22 0404  WBC 5.8 4.6 3.8*  HGB 7.5* 7.1* 6.5*  HCT 21.7* 21.4* 19.6*  PLT 106* 101* 96*   BMET Recent Labs    11/11/22 0321 11/12/22 0438 11/13/22 0727  NA 125* 127* 130*  K 3.3* 3.7 3.8  CL 94* 98 97*  CO2 24 18* 21*  GLUCOSE 95 92 91  BUN 9 9 7   CREATININE 1.49* 1.32* 1.42*  CALCIUM 7.8* 8.0* 8.3*   LFT Recent Labs    11/13/22 0727  PROT 6.2*  ALBUMIN 3.0*  AST 110*  ALT 28  ALKPHOS 62  BILITOT 3.3*  BILIDIR 1.2*  IBILI 2.1*   PT/INR No results for input(s): "LABPROT", "INR" in the last 72 hours.    Scheduled inpatient medications:   sodium chloride   Intravenous Once   feeding supplement  237 mL Oral BID BM   folic acid  1 mg Oral Daily   lactulose  20 g Oral BID   levothyroxine  100 mcg Oral QAC breakfast   melatonin  10 mg Oral QHS   metroNIDAZOLE  500 mg Oral Q12H   midodrine  10 mg Oral TID WC   multivitamin with minerals  1 tablet Oral Daily   nadolol  20 mg Oral Daily   pantoprazole  40 mg Oral BID AC   thiamine  100 mg Oral Daily   Continuous inpatient infusions:   cefTRIAXone (ROCEPHIN)  IV 2 g (11/13/22 0939)   PRN inpatient medications: LORazepam, ondansetron **OR** ondansetron (ZOFRAN) IV, mouth rinse  Vital signs in last 24 hours: Temp:  [97.7 F (36.5 C)-98 F (36.7 C)] 97.9 F (36.6 C) (11/12 1015) Pulse Rate:  [68-79] 79 (11/12 1015) Resp:  [15-20] 18 (11/12 1015) BP: (83-108)/(54-66) 95/64 (11/12 1015) SpO2:  [97 %-100 %] 97 % (11/12 1015) Last BM Date : 11/12/22  Intake/Output Summary (Last 24 hours) at 11/13/2022 1020 Last data filed at 11/13/2022 0306 Gross per 24 hour  Intake 1069.94 ml  Output --  Net 1069.94 ml    Intake/Output from previous day: 11/11 0701 - 11/12 0700 In: 1723.6 [IV Piggyback:1723.6] Out: -  Intake/Output this shift: No intake/output data  recorded.   Physical Exam:  General: Alert female in NAD Heart:  Regular rate and rhythm.  Pulmonary: Normal respiratory effort Abdomen: Soft, moderately distended, nontender. Normal bowel sounds. Extremities: Bilateral lower extremity pitting edema  Neurologic: Alert and oriented. No asterixis Psych: Cooperative. Insight appears normal.     LOS: 5 days   Willette Cluster ,NP 11/13/2022, 10:20 AM

## 2022-11-13 NOTE — Plan of Care (Signed)
  Problem: Education: Goal: Knowledge of General Education information will improve Description: Including pain rating scale, medication(s)/side effects and non-pharmacologic comfort measures Outcome: Progressing   Problem: Health Behavior/Discharge Planning: Goal: Ability to manage health-related needs will improve Outcome: Progressing   Problem: Clinical Measurements: Goal: Will remain free from infection Outcome: Progressing Goal: Respiratory complications will improve Outcome: Progressing   Problem: Activity: Goal: Risk for activity intolerance will decrease Outcome: Progressing   Problem: Nutrition: Goal: Adequate nutrition will be maintained Outcome: Progressing   

## 2022-11-13 NOTE — Plan of Care (Signed)
Patient was scheduled for outpatient TJ liver bx on 11/08/22, she presented to [facility] IR but the procedure was canceled as pre procedure las showed hgb 5.8. She was sent to ED for further eval and management, has been admitted since 11/08/22 and GI is following.   GI note from 11/9 states can hold TJ liver bx for now and decide based on labs and pending liver workup. 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  Discussed with Dr. Loreta Ave, no plan for liver bx with IR in place for now but IR will be happy to schedule the patient if GI feels random liver bx is still needed after serologic w/u.  Please call IR for questions and concerns.   Lynann Bologna Caron Tardif PA-C 11/13/2022 4:29 PM

## 2022-11-13 NOTE — Plan of Care (Signed)

## 2022-11-13 NOTE — Progress Notes (Signed)
Mobility Specialist Progress Note;   11/13/22 1355  Mobility  Activity Ambulated with assistance in hallway  Level of Assistance Contact guard assist, steadying assist  Assistive Device Other (Comment) (IV pole)  Distance Ambulated (ft) 100 ft  Activity Response Tolerated well  Mobility Referral Yes  $Mobility charge 1 Mobility  Mobility Specialist Start Time (ACUTE ONLY) 1355  Mobility Specialist Stop Time (ACUTE ONLY) 1405  Mobility Specialist Time Calculation (min) (ACUTE ONLY) 10 min   Pt agreeable to mobility. Required minG assistance during ambulation for safety. No c/o pain when asked. VSS throughout. Pt back in bed with all needs met.   Caesar Bookman Mobility Specialist Please contact via SecureChat or Rehab Office [phone]

## 2022-11-13 NOTE — Progress Notes (Signed)
PROGRESS NOTE        PATIENT DETAILS Name: Marisa Contreras Age: 47 y.o. Sex: female Date of Birth: [birth year] Admit Date: 11/08/2022 Admitting Physician Therisa Doyne, MD [id number], Bryon Lions, MD  Brief Summary: Patient is a 47 y.o.  female with history of alcoholic liver cirrhosis-sent to ED by IR for incidental finding of hemoglobin of 5.8.  Significant events: 11/7>> admit to [facility]  Significant studies: 11/7>> CT abdomen/pelvis: Colitis involving ascending/descending/sigmoid colon.  Significant microbiology data: 11/7>>  COVID PCR: Negative  Procedures: 11/8>> 1.8 L paracentesis by IR 11/8>> EGD: Grade 1 esophageal varices, LA grade C reflux esophagitis.  Consults: None  Subjective: Claims to have had "dark greenish stools" overnight.  Asking for a sleeping pill as she did not sleep much overnight.  Objective: Vitals: Blood pressure 95/64, pulse 79, temperature 97.9 F (36.6 C), temperature source Oral, resp. rate 18, height 5\' 7"  (1.702 m), weight 58 kg, SpO2 97%.   Exam: Gen Exam:Alert awake-not in any distress HEENT:atraumatic, normocephalic Chest: B/L clear to auscultation anteriorly CVS:S1S2 regular Abdomen:soft non tender, non distended Extremities:no edema Neurology: Non focal Skin: no rash  Pertinent Labs/Radiology:    Latest Ref Rng & Units 11/13/2022    4:04 AM 11/12/2022    4:38 AM 11/11/2022    3:21 AM  CBC  WBC 4.0 - 10.5 K/uL 3.8  4.6  5.8   Hemoglobin 12.0 - 15.0 g/dL 6.5  7.1  7.5   Hematocrit 36.0 - 46.0 % 19.6  21.4  21.7   Platelets 150 - 400 K/uL 96  101  106     Lab Results  Component Value Date   NA 130 (L) 11/13/2022   K 3.8 11/13/2022   CL 97 (L) 11/13/2022   CO2 21 (L) 11/13/2022      Assessment/Plan: Alcoholic liver cirrhosis Recurrent ascites 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  Repeat paracentesis attempted 11/11-scant ascites present. Diuretics started on 11/9-developed hypotension-now on midodrine/albumin.   Hold nadolol Alpha-fetoprotein/hemochromatosis workup/ANA/anti-smooth muscle antibody pending-but thought to have alcohol as the etiology for cirrhosis.  Colitis No diarrhea but frequent stools Unclear if this is colitis or bowel thickening from hyperbilirubinemia Transition to Rocephin/Flagyl x 5 days total.  AKI Mild-hemodynamically mediated Continue midodrine/albumin Hold nadolol/diuretics.  Macrocytic anemia No overt GI bleeding but poor historian-acknowledges dark green stools over the past several days. S/p EGD- grade 1 varices Hb down to 6.5 this morning-will get another unit of PRBC transfusion (fourth unit so far) Discussed with GI-they have tentatively planning a colonoscopy. Continue to follow CBC closely Continue PPI  Hypothyroidism Synthroid  Hyponatremia Secondary to volume overload/ascites Holding diuretics due to soft blood pressure Sodium levels improving with just supportive care.  Anxiety As needed lorazepam.  Nutrition Status: Nutrition Problem: Inadequate oral intake Etiology: poor appetite, early satiety, nausea Signs/Symptoms: per patient/family report Interventions: Ensure Enlive (each supplement provides 350kcal and 20 grams of protein), MVI, Education  BMI: Estimated body mass index is 20.03 kg/m as calculated from the following:   Height as of this encounter: 5\' 7"  (1.702 m).   Weight as of this encounter: 58 kg.   Code status:   Code Status: Full Code   DVT Prophylaxis: SCDs Start: 11/08/22 2031   Family Communication: None at bedside   Disposition  Plan: Status is: Inpatient Remains inpatient appropriate because: Severity of illness   Planned Discharge Destination:Home   Diet: Diet Order             Diet heart healthy/carb modified Fluid consistency: Thin;  Fluid restriction: 1200 mL Fluid  Diet effective now                     Antimicrobial agents: Anti-infectives (From admission, onward)    Start     Dose/Rate Route Frequency Ordered Stop   11/10/22 1015  cefTRIAXone (ROCEPHIN) 2 g in sodium chloride 0.9 % 100 mL IVPB        2 g 200 mL/hr over 30 Minutes Intravenous Every 24 hours 11/10/22 0924 11/15/22 1014   11/10/22 1015  metroNIDAZOLE (FLAGYL) tablet 500 mg        500 mg Oral Every 12 hours 11/10/22 0924 11/15/22 0959   11/09/22 0300  piperacillin-tazobactam (ZOSYN) IVPB 3.375 g  Status:  Discontinued        3.375 g 12.5 mL/hr over 240 Minutes Intravenous Every 8 hours 11/08/22 2008 11/10/22 0924   11/08/22 1845  piperacillin-tazobactam (ZOSYN) IVPB 3.375 g        3.375 g 100 mL/hr over 30 Minutes Intravenous  Once 11/08/22 1842 11/08/22 1938        MEDICATIONS: Scheduled Meds:  sodium chloride   Intravenous Once   feeding supplement  237 mL Oral BID BM   folic acid  1 mg Oral Daily   lactulose  20 g Oral BID   levothyroxine  100 mcg Oral QAC breakfast   melatonin  10 mg Oral QHS   metroNIDAZOLE  500 mg Oral Q12H   midodrine  10 mg Oral TID WC   multivitamin with minerals  1 tablet Oral Daily   nadolol  20 mg Oral Daily   pantoprazole  40 mg Oral BID AC   thiamine  100 mg Oral Daily   Continuous Infusions:  cefTRIAXone (ROCEPHIN)  IV 2 g (11/13/22 0939)   PRN Meds:.LORazepam, ondansetron **OR** ondansetron (ZOFRAN) IV, mouth rinse   I have personally reviewed following labs and imaging studies  LABORATORY DATA: CBC: Recent Labs  Lab 11/08/22 2149 11/09/22 0051 11/09/22 0945 11/10/22 0408 11/11/22 0321 11/12/22 0438 11/13/22 0404  WBC 6.3   < > 6.8 5.3 5.8 4.6 3.8*  NEUTROABS 4.5  --   --   --   --   --   --   HGB 6.6*   < > 9.0* 8.2* 7.5* 7.1* 6.5*  HCT 19.7*   < > 26.1* 23.3* 21.7* 21.4* 19.6*  MCV 99.0   < > 97.8 97.5 100.0 102.4* 102.1*  PLT 115*   < > 132* 108* 106* 101* 96*   < > = values  in this interval not displayed.    Basic Metabolic Panel: Recent Labs  Lab 11/08/22 2149 11/09/22 0051 11/09/22 0945 11/10/22 0408 11/11/22 0321 11/12/22 0438 11/13/22 0727  NA 126* 127* 127* 127* 125* 127* 130*  K 3.7 3.6 3.7 3.2* 3.3* 3.7 3.8  CL 93* 91* 90* 93* 94* 98 97*  CO2 22 23 24 24 24  18* 21*  GLUCOSE 95 97 96 91 95 92 91  BUN 7 6 8 9 9 9 7   CREATININE 1.06* 1.06* 1.22* 1.24* 1.49* 1.32* 1.42*  CALCIUM 7.5* 7.7* 8.3* 8.2* 7.8* 8.0* 8.3*  MG 0.9* 0.9* 1.5* 1.3* 1.8  --   --   PHOS 3.9 3.9  --   --   --   --   --  GFR: Estimated Creatinine Clearance: 45.8 mL/min (A) (by C-G formula based on SCr of 1.42 mg/dL (H)).  Liver Function Tests: Recent Labs  Lab 11/09/22 0051 11/09/22 0945 11/10/22 0408 11/11/22 0321 11/13/22 0727  AST 92* 96* 81* 135* 110*  ALT 24 28 23 29 28   ALKPHOS 80 91 68 73 62  BILITOT 6.6* 9.2* 5.1* 3.9* 3.3*  PROT 6.6 7.4 6.1* 5.9* 6.2*  ALBUMIN 2.4* 2.6* 2.2* 2.2* 3.0*   Recent Labs  Lab 11/08/22 1207  LIPASE 53*   Recent Labs  Lab 11/08/22 2149  AMMONIA 38*    Coagulation Profile: Recent Labs  Lab 11/08/22 0758 11/09/22 0051  INR 1.5* 1.5*    Cardiac Enzymes: Recent Labs  Lab 11/08/22 2149  CKTOTAL 48    BNP (last 3 results) No results for input(s): "PROBNP" in the last 8760 hours.  Lipid Profile: No results for input(s): "CHOL", "HDL", "LDLCALC", "TRIG", "CHOLHDL", "LDLDIRECT" in the last 72 hours.   Thyroid Function Tests: No results for input(s): "TSH", "T4TOTAL", "FREET4", "T3FREE", "THYROIDAB" in the last 72 hours.   Anemia Panel: No results for input(s): "VITAMINB12", "FOLATE", "FERRITIN", "TIBC", "IRON", "RETICCTPCT" in the last 72 hours.   Urine analysis:    Component Value Date/Time   COLORURINE AMBER (A) 11/09/2022 0450   APPEARANCEUR CLEAR 11/09/2022 0450   LABSPEC 1.042 (H) 11/09/2022 0450   PHURINE 5.0 11/09/2022 0450   GLUCOSEU NEGATIVE 11/09/2022 0450   HGBUR NEGATIVE 11/09/2022  0450   BILIRUBINUR NEGATIVE 11/09/2022 0450   KETONESUR 5 (A) 11/09/2022 0450   PROTEINUR NEGATIVE 11/09/2022 0450   NITRITE NEGATIVE 11/09/2022 0450   LEUKOCYTESUR NEGATIVE 11/09/2022 0450    Sepsis Labs: Lactic Acid, Venous    Component Value Date/Time   LATICACIDVEN 0.8 11/09/2022 0050    MICROBIOLOGY: Recent Results (from the past 240 hour(s))  SARS Coronavirus 2 by RT PCR (hospital order, performed in [facility] hospital lab) *cepheid single result test* Anterior Nasal Swab     Status: None   Collection Time: 11/08/22  7:52 PM   Specimen: Anterior Nasal Swab  Result Value Ref Range Status   SARS Coronavirus 2 by RT PCR NEGATIVE NEGATIVE Final    Comment: Performed at [facility], [address], [place], [place] [address]    RADIOLOGY STUDIES/RESULTS: No results found.   LOS: 5 days   Jeoffrey Massed, MD  [facility]    To contact the attending provider between 7A-7P or the covering provider during after hours 7P-7A, please log into the web site [email or web address] and access using universal [place] password for that web site. If you do not have the password, please call the hospital operator.  11/13/2022, 10:20 AM

## 2022-11-13 NOTE — H&P (View-Only) (Signed)
Daily Progress Note  DOA: 11/08/2022 Hospital Day: 6  Chief Complaint: Anemia, FOBT+, recently diagnosed decompensated cirrhosis   ASSESSMENT    Brief Narrative:  Marisa Contreras is a 47 y.o. year old female with a history of  cirrhosis with portal HTN, hypothyroidism, cholelithiasis, Admitted 11/7 from IR department where she was scheduled to have a tranjugular liver biopsy (ordered by GYN for workup of ascites) but was found to be severely anemic. GI saw in consult 11/8.   Recently diagnosed cirrhosis with ascites, small esophageal varices, portal hypertensive gastropathy, mild splenomegaly / thrombocytopenia. Etiology of cirrhosis not yet determined but possibly Etoh related MELD 3.0: 27 at 11/10/2022 .  MDF on admission didn't meet criteria for steroids Hepatic serologic workup still in progress- mostly negative but still ruling out hemochromatosis and her ceruloplasmin is slightly low Empirically getting antibiotics since unable to obtain cell counts on paracentesis Today:  Tbili improving ( 3.3), AST 110 / ALT 28.   AKI Cr up again overnight ( 1.32 >> 1.42).    Symptomatic macrocytic anemia ( hgb 5.8 on admission)  / FOBT+ Hgb improved to 9 post 3 u prbcs but has since drifted over last few days to 6.5 in absence of any overt GI bleeding ( per my discussions with her)   Folate deficiency   Abnormal colon on CT scan with contrast - Wall thickening of ascending, descending and sigmoid colon concerning for infectious or inflammatory colitis. Findings could also be 2/2 to portal hypertension / colopathy --Appears stool studies ordered but never collected. No significant diarrhea. Denies abdominal pain, WBC normal. Received 3 days of flagyl   Reflux esophagitis, LA grade C   Principal Problem:   Colitis Active Problems:   HTN (hypertension), benign   Hypothyroidism   Cirrhosis of liver (HCC)   Symptomatic anemia   Folate deficiency   Hyponatremia   Hypokalemia    Alcoholic cirrhosis of liver with ascites (HCC)   Hypomagnesemia   Jaundice   Encephalopathy, hepatic (HCC)   Portal hypertensive gastropathy (HCC)   Gastroesophageal reflux disease with esophagitis   PLAN   --Nadolol on hold ( last dose yesterday) due to hypotension. --Need to resume diuretics but holding for now as creatinine bumped again overnight from 1.32 >> 1.42 --Continue 2 gram sodium restricted diet --Continue BID PPI --Continue Folvite --I will discontinue Rocephin today (has gotten 5 days of antibiotics including Zosyn and Rocephin) --Awaiting hemochromatosis studies  --Will check serum copper vr urinary copper levels.  --am CMET, INR, CBC --Getting 1 uni blood now.  --Colonoscopy tomorrow for worsening anemia. The risks and benefits of colonoscopy with possible polypectomy / biopsies were discussed and the patient agrees to proceed.  -Hold lactulose today. Stools already loose and will be getting bowel prep for colonoscopy today.   Subjective   Feels okay today. No abdominal pain.    Objective   EGD 11/8 Grade 1 varices in lower 1/3 of esophagus. LA Grade 3 esophagitis without bleeding. Moderate portal hypertensive gastropathy, biopsies taken. Examined duodenum normal.    FINAL MICROSCOPIC DIAGNOSIS:   A. STOMACH, BIOPSY:       Gastric antral mucosa with reactive epithelial changes, lamina  propria edema and vascular dilation suggestive for portal hypertensive  gastropathy.      Gastric oxyntic mucosa without significant diagnostic alteration.       No H. pylori identified on HE stain.       Negative for intestinal metaplasia or dysplasia.    Cirrhosis  evaluation:  HCV ab negative, Hep B surf ag negative.  ASMA negative A-1 Antitrypsin normal Ceruloplasmin low at 15.9 ( ref 19 - 39) ANA negative *Celiac serologies not obtained but duodenum appeared normal on EGD done as part of anemia evaluation HCC screening: normal AFP. No reported focal liver lesion on  CT with contrast this admission.   10/10/22 ascitic fluid cytology negative for malignant cells  Recent Labs    11/11/22 0321 11/12/22 0438 11/13/22 0404  WBC 5.8 4.6 3.8*  HGB 7.5* 7.1* 6.5*  HCT 21.7* 21.4* 19.6*  PLT 106* 101* 96*   BMET Recent Labs    11/11/22 0321 11/12/22 0438 11/13/22 0727  NA 125* 127* 130*  K 3.3* 3.7 3.8  CL 94* 98 97*  CO2 24 18* 21*  GLUCOSE 95 92 91  BUN 9 9 7   CREATININE 1.49* 1.32* 1.42*  CALCIUM 7.8* 8.0* 8.3*   LFT Recent Labs    11/13/22 0727  PROT 6.2*  ALBUMIN 3.0*  AST 110*  ALT 28  ALKPHOS 62  BILITOT 3.3*  BILIDIR 1.2*  IBILI 2.1*   PT/INR No results for input(s): "LABPROT", "INR" in the last 72 hours.    Scheduled inpatient medications:   sodium chloride   Intravenous Once   feeding supplement  237 mL Oral BID BM   folic acid  1 mg Oral Daily   lactulose  20 g Oral BID   levothyroxine  100 mcg Oral QAC breakfast   melatonin  10 mg Oral QHS   metroNIDAZOLE  500 mg Oral Q12H   midodrine  10 mg Oral TID WC   multivitamin with minerals  1 tablet Oral Daily   nadolol  20 mg Oral Daily   pantoprazole  40 mg Oral BID AC   thiamine  100 mg Oral Daily   Continuous inpatient infusions:   cefTRIAXone (ROCEPHIN)  IV 2 g (11/13/22 0939)   PRN inpatient medications: LORazepam, ondansetron **OR** ondansetron (ZOFRAN) IV, mouth rinse  Vital signs in last 24 hours: Temp:  [97.7 F (36.5 C)-98 F (36.7 C)] 97.9 F (36.6 C) (11/12 1015) Pulse Rate:  [68-79] 79 (11/12 1015) Resp:  [15-20] 18 (11/12 1015) BP: (83-108)/(54-66) 95/64 (11/12 1015) SpO2:  [97 %-100 %] 97 % (11/12 1015) Last BM Date : 11/12/22  Intake/Output Summary (Last 24 hours) at 11/13/2022 1020 Last data filed at 11/13/2022 0306 Gross per 24 hour  Intake 1069.94 ml  Output --  Net 1069.94 ml    Intake/Output from previous day: 11/11 0701 - 11/12 0700 In: 1723.6 [IV Piggyback:1723.6] Out: -  Intake/Output this shift: No intake/output data  recorded.   Physical Exam:  General: Alert female in NAD Heart:  Regular rate and rhythm.  Pulmonary: Normal respiratory effort Abdomen: Soft, moderately distended, nontender. Normal bowel sounds. Extremities: Bilateral lower extremity pitting edema  Neurologic: Alert and oriented. No asterixis Psych: Cooperative. Insight appears normal.     LOS: 5 days   Willette Cluster ,NP 11/13/2022, 10:20 AM

## 2022-11-14 ENCOUNTER — Inpatient Hospital Stay (HOSPITAL_COMMUNITY): Payer: 59 | Admitting: Certified Registered"

## 2022-11-14 ENCOUNTER — Encounter (HOSPITAL_COMMUNITY): Payer: Self-pay | Admitting: Internal Medicine

## 2022-11-14 ENCOUNTER — Other Ambulatory Visit (HOSPITAL_COMMUNITY): Payer: Self-pay

## 2022-11-14 ENCOUNTER — Encounter (HOSPITAL_COMMUNITY)
Admission: EM | Disposition: A | Discharge status: Home / Self Care | Payer: Self-pay | Source: Home / Self Care | Attending: Internal Medicine

## 2022-11-14 DIAGNOSIS — K529 Noninfective gastroenteritis and colitis, unspecified: Secondary | ICD-10-CM | POA: Diagnosis not present

## 2022-11-14 DIAGNOSIS — K5520 Angiodysplasia of colon without hemorrhage: Secondary | ICD-10-CM | POA: Diagnosis not present

## 2022-11-14 DIAGNOSIS — K7031 Alcoholic cirrhosis of liver with ascites: Secondary | ICD-10-CM | POA: Diagnosis not present

## 2022-11-14 DIAGNOSIS — K922 Gastrointestinal hemorrhage, unspecified: Secondary | ICD-10-CM

## 2022-11-14 DIAGNOSIS — E871 Hypo-osmolality and hyponatremia: Secondary | ICD-10-CM | POA: Diagnosis not present

## 2022-11-14 DIAGNOSIS — I10 Essential (primary) hypertension: Secondary | ICD-10-CM | POA: Diagnosis not present

## 2022-11-14 DIAGNOSIS — R195 Other fecal abnormalities: Secondary | ICD-10-CM | POA: Insufficient documentation

## 2022-11-14 HISTORY — PX: COLONOSCOPY WITH PROPOFOL: SHX5780

## 2022-11-14 HISTORY — PX: HOT HEMOSTASIS: SHX5433

## 2022-11-14 LAB — CBC
HCT: 23.9 % — ABNORMAL LOW (ref 36.0–46.0)
Hemoglobin: 8.2 g/dL — ABNORMAL LOW (ref 12.0–15.0)
MCH: 33.6 pg (ref 26.0–34.0)
MCHC: 34.3 g/dL (ref 30.0–36.0)
MCV: 98 fL (ref 80.0–100.0)
Platelets: 100 10*3/uL — ABNORMAL LOW (ref 150–400)
RBC: 2.44 MIL/uL — ABNORMAL LOW (ref 3.87–5.11)
RDW: 19.3 % — ABNORMAL HIGH (ref 11.5–15.5)
WBC: 3.7 10*3/uL — ABNORMAL LOW (ref 4.0–10.5)
nRBC: 0 % (ref 0.0–0.2)

## 2022-11-14 LAB — TYPE AND SCREEN
ABO/RH(D): O POS
Antibody Screen: NEGATIVE
Unit division: 0

## 2022-11-14 LAB — PROTIME-INR
INR: 2.1 — ABNORMAL HIGH (ref 0.8–1.2)
Prothrombin Time: 23.9 s — ABNORMAL HIGH (ref 11.4–15.2)

## 2022-11-14 LAB — PH, BODY FLUID: pH, Body Fluid: 7.5

## 2022-11-14 LAB — BPAM RBC
Blood Product Expiration Date: 202411192359
ISSUE DATE / TIME: 202411121011
Unit Type and Rh: 5100

## 2022-11-14 LAB — COMPREHENSIVE METABOLIC PANEL
ALT: 30 U/L (ref 0–44)
AST: 110 U/L — ABNORMAL HIGH (ref 15–41)
Albumin: 3.3 g/dL — ABNORMAL LOW (ref 3.5–5.0)
Alkaline Phosphatase: 58 U/L (ref 38–126)
Anion gap: 13 (ref 5–15)
BUN: 6 mg/dL (ref 6–20)
CO2: 18 mmol/L — ABNORMAL LOW (ref 22–32)
Calcium: 8.3 mg/dL — ABNORMAL LOW (ref 8.9–10.3)
Chloride: 98 mmol/L (ref 98–111)
Creatinine, Ser: 1.12 mg/dL — ABNORMAL HIGH (ref 0.44–1.00)
GFR, Estimated: >60 mL/min (ref >60)
Glucose, Bld: 96 mg/dL (ref 70–99)
Potassium: 3.3 mmol/L — ABNORMAL LOW (ref 3.5–5.1)
Sodium: 129 mmol/L — ABNORMAL LOW (ref 135–145)
Total Bilirubin: 4.1 mg/dL — ABNORMAL HIGH (ref <1.2)
Total Protein: 6.2 g/dL — ABNORMAL LOW (ref 6.5–8.1)

## 2022-11-14 SURGERY — COLONOSCOPY WITH PROPOFOL
Anesthesia: Monitor Anesthesia Care

## 2022-11-14 MED ORDER — PROPOFOL 10 MG/ML IV BOLUS
INTRAVENOUS | Status: DC | PRN
  Administered 2022-11-14: 60 mg via INTRAVENOUS

## 2022-11-14 MED ORDER — SPIRONOLACTONE 25 MG PO TABS
25.0000 mg | ORAL_TABLET | Freq: Every day | ORAL | 3 refills | Status: DC
  Filled 2022-11-14: qty 30, 30d supply, fill #0

## 2022-11-14 MED ORDER — LACTATED RINGERS IV SOLN: INTRAVENOUS | Status: DC | PRN

## 2022-11-14 MED ORDER — RIFAXIMIN 550 MG PO TABS: 550.0000 mg | ORAL_TABLET | Freq: Two times a day (BID) | ORAL | Status: DC

## 2022-11-14 MED ORDER — ACETAMINOPHEN 325 MG PO TABS: 650.0000 mg | ORAL_TABLET | Freq: Four times a day (QID) | ORAL | Status: DC | PRN

## 2022-11-14 MED ORDER — POTASSIUM CHLORIDE 10 MEQ/100ML IV SOLN
10.0000 meq | INTRAVENOUS | Status: AC
  Administered 2022-11-14 (×2): 10 meq via INTRAVENOUS
  Filled 2022-11-14 (×2): qty 100

## 2022-11-14 MED ORDER — LIDOCAINE 2% (20 MG/ML) 5 ML SYRINGE
INTRAMUSCULAR | Status: DC | PRN
  Administered 2022-11-14: 60 mg via INTRAVENOUS

## 2022-11-14 MED ORDER — RIFAXIMIN 550 MG PO TABS
550.0000 mg | ORAL_TABLET | Freq: Two times a day (BID) | ORAL | 3 refills | Status: DC
  Filled 2022-11-14: qty 60, 30d supply, fill #0

## 2022-11-14 MED ORDER — PROPOFOL 500 MG/50ML IV EMUL
INTRAVENOUS | Status: DC | PRN
  Administered 2022-11-14: 100 ug/kg/min via INTRAVENOUS

## 2022-11-14 MED ORDER — THIAMINE HCL 100 MG PO TABS
100.0000 mg | ORAL_TABLET | Freq: Every day | ORAL | 2 refills | Status: AC
  Filled 2022-11-14: qty 30, 30d supply, fill #0

## 2022-11-14 MED ORDER — LACTULOSE 10 GM/15ML PO SOLN
20.0000 g | Freq: Two times a day (BID) | ORAL | 2 refills | Status: AC
  Filled 2022-11-14: qty 1892, 30d supply, fill #0

## 2022-11-14 MED ORDER — MIDODRINE HCL 10 MG PO TABS
10.0000 mg | ORAL_TABLET | Freq: Three times a day (TID) | ORAL | 3 refills | Status: DC
  Filled 2022-11-14: qty 90, 30d supply, fill #0

## 2022-11-14 MED ORDER — FOLIC ACID 1 MG PO TABS
1.0000 mg | ORAL_TABLET | Freq: Every day | ORAL | 3 refills | Status: AC
  Filled 2022-11-14: qty 30, 30d supply, fill #0

## 2022-11-14 SURGICAL SUPPLY — 22 items

## 2022-11-14 NOTE — TOC Transition Note (Signed)
Transition of Care [facility]) - CM/SW Discharge Note   Patient Details  Name: Marisa Contreras MRN: [id number] Date of Birth: 20-Oct-1975  [facility] CM/SW Contact:  Gordy Clement, RN Phone Number: 11/14/2022, 11:45 AM   Clinical Narrative:    Patient will dc to home today with Husband. No TOC needs have been identified  Husband to transport           Patient Goals and CMS Choice      Discharge Placement                         Discharge Plan and Services Additional resources added to the After Visit Summary for                                       Social Determinants of Health (SDOH) Interventions SDOH Screenings   Food Insecurity: No Food Insecurity (11/09/2022)  Housing: Low Risk  (11/09/2022)  Transportation Needs: No Transportation Needs (11/09/2022)  Utilities: Not At Risk (11/09/2022)  Tobacco Use: Medium Risk (11/09/2022)     Readmission Risk Interventions     No data to display

## 2022-11-14 NOTE — Transfer of Care (Signed)
Immediate Anesthesia Transfer of Care Note  Patient: Marisa Contreras  Procedure(s) Performed: COLONOSCOPY WITH PROPOFOL HOT HEMOSTASIS (ARGON PLASMA COAGULATION/BICAP)  Patient Location: PACU  Anesthesia Type:MAC  Level of Consciousness: sedated  Airway & Oxygen Therapy: Patient Spontanous Breathing  Post-op Assessment: Report given to RN and Post -op Vital signs reviewed and stable  Post vital signs: Reviewed and stable  Last Vitals:  Vitals Value Taken Time  BP 99/69 11/14/22 1106  Temp    Pulse 69 11/14/22 1113  Resp 23 11/14/22 1113  SpO2 99 % 11/14/22 1113  Vitals shown include unfiled device data.  Last Pain:  Vitals:   11/14/22 1000  TempSrc: Temporal  PainSc: 3          Complications: No notable events documented.

## 2022-11-14 NOTE — Interval H&P Note (Signed)
History and Physical Interval Note:  11/14/2022 10:13 AM  Marisa Contreras  has presented today for surgery, with the diagnosis of anemia.  The various methods of treatment have been discussed with the patient and family. After consideration of risks, benefits and other options for treatment, the patient has consented to  Procedure(s): COLONOSCOPY WITH PROPOFOL (N/A) as a surgical intervention.  The patient's history has been reviewed, patient examined, no change in status, stable for surgery.  I have reviewed the patient's chart and labs.  Questions were answered to the patient's satisfaction.     Jenel Lucks

## 2022-11-14 NOTE — Op Note (Addendum)
[facility] Patient Name: Marisa Contreras Procedure Date : 11/14/2022 MRN: [id number] Attending MD: Dub Amis. Tomasa Rand , MD, [id number] Date of Birth: December 20, 1975 CSN: [id number] Age: 47 Admit Type: Inpatient Procedure:                Colonoscopy Indications:              Heme positive stool, Iron deficiency anemia                            secondary to chronic blood loss Providers:                Lorin Picket E. Tomasa Rand, MD, Doristine Mango, RNRebecca                            Secor, RN, Referring MD:              Medicines:                Monitored Anesthesia Care Complications:            No immediate complications. Estimated Blood Loss:     Estimated blood loss was minimal. Procedure:                Pre-Anesthesia Assessment:                           - Prior to the procedure, a History and Physical                            was performed, and patient medications and                            allergies were reviewed. The patient's tolerance of                            previous anesthesia was also reviewed. The risks                            and benefits of the procedure and the sedation                            options and risks were discussed with the patient.                            All questions were answered, and informed consent                            was obtained. Prior Anticoagulants: The patient has                            taken no anticoagulant or antiplatelet agents. ASA                            Grade Assessment: III - A patient with severe  systemic disease. After reviewing the risks and                            benefits, the patient was deemed in satisfactory                            condition to undergo the procedure.                           After obtaining informed consent, the colonoscope                            was passed under direct vision. Throughout the                            procedure,  the patient's blood pressure, pulse, and                            oxygen saturations were monitored continuously. The                            [id number]-HQ190L ([id number]) Olympus peds colonscope was                            introduced through the anus and advanced to the the                            terminal ileum, with identification of the                            appendiceal orifice and IC valve. The colonoscopy                            was performed without difficulty. The patient                            tolerated the procedure well. The quality of the                            bowel preparation was good. The terminal ileum,                            ileocecal valve, appendiceal orifice, and rectum                            were photographed. The bowel preparation used was                            MoviPrep via split dose instruction. Scope In: 10:42:41 AM Scope Out: 10:58:41 AM Scope Withdrawal Time: 0 hours 10 minutes 33 seconds  Total Procedure Duration: 0 hours 16 minutes 0 seconds  Findings:      Hemorrhoids were found on perianal exam.      The digital rectal exam was normal. Pertinent negatives include normal  sphincter tone and no palpable rectal lesions.      Two small angiodysplastic lesions without bleeding were found in the       ascending colon and in the cecum. Coagulation for tissue destruction       using argon plasma was successful. Estimated blood loss was minimal.      The exam was otherwise normal throughout the examined colon.      The terminal ileum appeared normal.      Non-bleeding internal hemorrhoids were found during retroflexion. The       hemorrhoids were medium-sized and Grade I (internal hemorrhoids that do       not prolapse).      No additional abnormalities were found on retroflexion. Impression:               - Hemorrhoids found on perianal exam.                           - Two non-bleeding colonic angiodysplastic lesions.                             Treated with argon plasma coagulation (APC). This                            is a potential source of GI bleeding/positive FOBT.                           - The examined portion of the ileum was normal.                           - Non-bleeding internal hemorrhoids.                           - No specimens collected. Moderate Sedation:      N/A Recommendation:           - Return patient to hospital ward for possible                            discharge same day.                           - Low sodium diet.                           - Continue present medications.                           - Repeat colonoscopy in 10 years for screening                            purposes.                           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 This                            can be restarted as outpatient.                           - Continue BID Protonix for 8 weeks, then decrease                            to once daily                           ADDENDUM: Will use spironolactone 25 mg PO daily                            instead of lasix due to hyponatremia and hypokalemia Procedure Code(s):        --- Professional ---                           [id number], Colonoscopy, flexible; with ablation of                            tumor(s), polyp(s), or other lesion(s) (includes                            pre- and post-dilation and guide wire passage, when                            performed) Diagnosis Code(s):        --- Professional ---                           M84.13, Angiodysplasia of colon without hemorrhage                           K64.0, First degree hemorrhoids                           R19.5, Other fecal abnormalities                            D50.0, Iron deficiency anemia secondary to blood                            loss (chronic) CPT copyright 2022 [employer]. All rights reserved. The codes documented in this report are preliminary and upon coder review may  be revised to meet current compliance requirements. Nick Armel E. Tomasa Rand, MD 11/14/2022 11:17:56 AM This report has been signed electronically. Number of Addenda: 0

## 2022-11-14 NOTE — Anesthesia Preprocedure Evaluation (Addendum)
Anesthesia Evaluation  Patient identified by MRN, date of birth, ID band Patient awake    Reviewed: Allergy & Precautions, NPO status , Patient's Chart, lab work & pertinent test results  Airway Mallampati: II  TM Distance: >3 FB Neck ROM: Full    Dental no notable dental hx. (+) Teeth Intact, Dental Advisory Given   Pulmonary former smoker   Pulmonary exam normal breath sounds clear to auscultation       Cardiovascular hypertension, Normal cardiovascular exam Rhythm:Regular Rate:Normal  TTE 2020 1. The left ventricle has normal systolic function with an ejection  fraction of 60-65%. The cavity size was normal. Left ventricular diastolic  parameters were normal.   2. The right ventricle has normal systolic function. The cavity was  normal. There is no increase in right ventricular wall thickness.   3. Mild thickening of the mitral valve leaflet.   4. The aortic valve is tricuspid.   5. The aorta is normal unless otherwise noted.     Neuro/Psych  Headaches  negative psych ROS   GI/Hepatic negative GI ROS,,,(+) Cirrhosis   Esophageal Varices and ascites      Endo/Other  Hypothyroidism    Renal/GU Renal InsufficiencyRenal diseaseLab Results      Component                Value               Date                      NA                       129 (L)             11/14/2022                CL                       98                  11/14/2022                K                        3.3 (L)             11/14/2022                CO2                      18 (L)              11/14/2022                BUN                      6                   11/14/2022                CREATININE               1.12 (H)            11/14/2022                GFRNONAA                 >  60                 11/14/2022                CALCIUM                  8.3 (L)             11/14/2022                PHOS                     3.9                  11/09/2022                ALBUMIN                  3.3 (L)             11/14/2022                GLUCOSE                  96                  11/14/2022             negative genitourinary   Musculoskeletal negative musculoskeletal ROS (+)    Abdominal   Peds  Hematology  (+) Blood dyscrasia, anemia Lab Results      Component                Value               Date                      WBC                      3.7 (L)             11/14/2022                HGB                      8.2 (L)             11/14/2022                HCT                      23.9 (L)            11/14/2022                MCV                      98.0                11/14/2022                PLT                      100 (L)             11/14/2022              Anesthesia Other Findings   Reproductive/Obstetrics  Anesthesia Physical Anesthesia Plan  ASA: 3  Anesthesia Plan: MAC   Post-op Pain Management:    Induction: Intravenous  PONV Risk Score and Plan: Propofol infusion and Treatment may vary due to age or medical condition  Airway Management Planned: Natural Airway  Additional Equipment:   Intra-op Plan:   Post-operative Plan:   Informed Consent: I have reviewed the patients History and Physical, chart, labs and discussed the procedure including the risks, benefits and alternatives for the proposed anesthesia with the patient or authorized representative who has indicated his/her understanding and acceptance.     Dental advisory given  Plan Discussed with: CRNA  Anesthesia Plan Comments:        Anesthesia Quick Evaluation

## 2022-11-14 NOTE — Anesthesia Postprocedure Evaluation (Signed)
Anesthesia Post Note  Patient: Fraidy Scarpa  Procedure(s) Performed: COLONOSCOPY WITH PROPOFOL HOT HEMOSTASIS (ARGON PLASMA COAGULATION/BICAP)     Patient location during evaluation: Endoscopy Anesthesia Type: MAC Level of consciousness: awake and alert Pain management: pain level controlled Vital Signs Assessment: post-procedure vital signs reviewed and stable Respiratory status: spontaneous breathing, nonlabored ventilation, respiratory function stable and patient connected to nasal cannula oxygen Cardiovascular status: stable and blood pressure returned to baseline Postop Assessment: no apparent nausea or vomiting Anesthetic complications: no  No notable events documented.  Last Vitals:  Vitals:   11/14/22 1115 11/14/22 1130  BP: 102/79 111/78  Pulse: 69   Resp: 20 19  Temp:  36.8 C  SpO2: 98% 100%    Last Pain:  Vitals:   11/14/22 1130  TempSrc:   PainSc: 0-No pain                 Landree Fernholz L Raife Lizer

## 2022-11-14 NOTE — Plan of Care (Signed)
  Problem: Education: Goal: Knowledge of General Education information will improve Description: Including pain rating scale, medication(s)/side effects and non-pharmacologic comfort measures Outcome: Progressing   Problem: Clinical Measurements: Goal: Will remain free from infection Outcome: Progressing Goal: Respiratory complications will improve Outcome: Progressing Goal: Cardiovascular complication will be avoided Outcome: Progressing   Problem: Nutrition: Goal: Adequate nutrition will be maintained Outcome: Progressing   Problem: Coping: Goal: Level of anxiety will decrease Outcome: Progressing   

## 2022-11-14 NOTE — Plan of Care (Signed)

## 2022-11-14 NOTE — Discharge Summary (Addendum)
PATIENT DETAILS Name: Marisa Contreras Age: 47 y.o. Sex: female Date of Birth: 03/23/1975 MRN: [id number]. Admitting Physician: Therisa Doyne, MD WUJ:[id number], Bryon Lions, MD  Admit Date: 11/08/2022 Discharge date: 11/14/2022  Recommendations for Outpatient Follow-up:  Follow up with PCP in 1-2 weeks Please obtain CMP/CBC in one week Please ensure follow-up with gastroenterology Depending on BP-please optimize diuretic regimen. Follow HFE gene-pending at the time of discharge Needs 24-hour urine copper collection to rule out Wilson's disease (although felt to be less likely)  Admitted From:  Home  Disposition: Home   Discharge Condition: good  CODE STATUS:   Code Status: Full Code   Diet recommendation:  Diet Order             Diet NPO time specified Except for: Sips with Meds  Diet effective midnight           Diet - low sodium heart healthy                    Brief Summary: Patient is a 47 y.o.  female with history of alcoholic liver cirrhosis-sent to ED by IR for incidental finding of hemoglobin of 5.8.   Significant events: 11/7>> admit to [facility]   Significant studies: 11/7>> CT abdomen/pelvis: Colitis involving ascending/descending/sigmoid colon.   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  HFE gene pending.  24-hour urine copper collection was attempted-however specimen was contaminated with stool.  This can be pursued in the outpatient setting (discussed with GI MD Dr.  Tomasa Rand 11/13) Patient underwent workup including paracentesis x 2, EGD which showed grade 1 varices. Will be on lactulose/rifaximin on discharge Blood pressure dropped when diuretics/nadolol was started together-she will be continued on midodrine-and will be on low-dose Aldactone on discharge. GI will arrange for outpatient follow-up and further optimization of medications.   Colitis No diarrhea but frequent stools Unclear if this is colitis or bowel thickening from hyperbilirubinemia Rocephin/Flagyl x 5 day completed.   AKI Mild-hemodynamically mediated-likely in the setting of diuretic/nadolol use Managed with midodrine/albumin with significant improvement in renal function On discharge-will continue midodrine-add low-dose Aldactone. PCP/GI to repeat electrolytes at next follow-up.   Macrocytic anemia No overt GI bleeding but poor historian-acknowledges dark green stools over the past several days.   EGD showed grade 1 varices without any obvious bleeding source-however after transfusion of 3 units-hemoglobin slowly trended down and was 6.5 on 11/12.  She was given another unit of PRBC (total of 4 units this hospitalization) with stability in hemoglobin f for the past day or so.  Colonoscopy on 11/13 demonstrated 2 nonbleeding AVMs-possibly the cause of FOBT/slowly worsening anemia. Repeat CBC 1 week.   Hypothyroidism Synthroid   Hyponatremia Secondary to volume overload/ascites Sodium levels fluctuating-only has mild hyponatremia-she is asymptomatic-this can be monitored closely in the outpatient setting.   Anxiety Managed with as needed lorazepam during this hospitalization.   Nutrition Status: Nutrition Problem: Inadequate oral intake Etiology: poor appetite, early satiety, nausea Signs/Symptoms: per patient/family report Interventions: Ensure Enlive (each supplement provides 350kcal and 20 grams of protein), MVI, Education   BMI: Estimated body mass  index is 20.03  kg/m as calculated from the following:   Height as of this encounter: 5\' 7"  (1.702 m).   Weight as of this encounter: 58 kg.    Discharge Diagnoses:  Principal Problem:   Colitis Active Problems:   HTN (hypertension), benign   Hypothyroidism   Cirrhosis of liver (HCC)   Anemia   Folate deficiency   Hyponatremia   Hypokalemia   Alcoholic cirrhosis of liver with ascites (HCC)   Hypomagnesemia   Jaundice   Encephalopathy, hepatic (HCC)   Portal hypertensive gastropathy (HCC)   Gastroesophageal reflux disease with esophagitis   Secondary esophageal varices without bleeding (HCC)   Fecal occult blood test positive   Discharge Instructions:  Activity:  As tolerated with Full fall precautions use walker/cane & assistance as needed   Discharge Instructions     Call MD for:  difficulty breathing, headache or visual disturbances   Complete by: As directed    Call MD for:  extreme fatigue   Complete by: As directed    Call MD for:  persistant dizziness or light-headedness   Complete by: As directed    Diet - low sodium heart healthy   Complete by: As directed    Discharge instructions   Complete by: As directed    Follow with Primary MD  Bradd Canary, MD in 1-2 weeks  Follow-up with gastroenterology-their office will call you with a follow-up appointment-if you do not hear from them-please give them a call.  Please get a complete blood count and chemistry panel checked by your Primary MD at your next visit, and again as instructed by your Primary MD.  Get Medicines reviewed and adjusted: Please take all your medications with you for your next visit with your Primary MD  Laboratory/radiological data: Please request your Primary MD to go over all hospital tests and procedure/radiological results at the follow up, please ask your Primary MD to get all Hospital records sent to his/her office.  In some cases, they will be blood work, cultures and biopsy results pending at  the time of your discharge. Please request that your primary care M.D. follows up on these results.  Also Note the following: If you experience worsening of your admission symptoms, develop shortness of breath, life threatening emergency, suicidal or homicidal thoughts you must seek medical attention immediately by calling 911 or calling your MD immediately  if symptoms less severe.  You must read complete instructions/literature along with all the possible adverse reactions/side effects for all the Medicines you take and that have been prescribed to you. Take any new Medicines after you have completely understood and accpet all the possible adverse reactions/side effects.   Do not drive when taking Pain medications or sleeping medications (Benzodaizepines)  Do not take more than prescribed Pain, Sleep and Anxiety Medications. It is not advisable to combine anxiety,sleep and pain medications without talking with your primary care practitioner  Special Instructions: If you have smoked or chewed Tobacco  in the last 2 yrs please stop smoking, stop any regular Alcohol  and or any Recreational drug use.  Wear Seat belts while driving.  Please note: You were cared for by a hospitalist during your hospital stay. Once you are discharged, your primary care physician will handle any further medical issues. Please note that NO REFILLS for any discharge medications will be authorized once you are discharged, as it is imperative that you return to your primary care physician (or establish a relationship with a primary  care physician if you do not have one) for your post hospital discharge needs so that they can reassess your need for medications and monitor your lab values.   Increase activity slowly   Complete by: As directed       Allergies as of 11/14/2022   No Known Allergies      Medication List     TAKE these medications    folic acid 1 MG tablet Commonly known as: FOLVITE Take 1 tablet  (1 mg total) by mouth daily. Start taking on: November 15, 2022   lactulose 10 GM/15ML solution Commonly known as: CHRONULAC Take 30 mLs (20 g total) by mouth 2 (two) times daily. Titrate for 2-3 bowel movements daily   levothyroxine 100 MCG tablet Commonly known as: SYNTHROID Take 100 mcg by mouth daily before breakfast.   midodrine 10 MG tablet Commonly known as: PROAMATINE Take 1 tablet (10 mg total) by mouth 3 (three) times daily with meals.   rifaximin 550 MG Tabs tablet Commonly known as: XIFAXAN Take 1 tablet (550 mg total) by mouth 2 (two) times daily.   spironolactone 25 MG tablet Commonly known as: Aldactone Take 1 tablet (25 mg total) by mouth daily.   thiamine 100 MG tablet Commonly known as: Vitamin B-1 Take 1 tablet (100 mg total) by mouth daily. Start taking on: November 15, 2022        Follow-up Information     Bradd Canary, MD. Schedule an appointment as soon as possible for a visit in 1 week(s).   Specialty: Family Medicine Contact information: [address] [place] [address] [phone]         [facility] Follow up.   Specialty: Gastroenterology Why: Office will call with date/time, If you dont hear from them,please give them a call Contact information: [address] [place] [address] [phone]               No Known Allergies   Other Procedures/Studies: IR Paracentesis  Result Date: 11/13/2022 INDICATION: 47 year old female with history of jaundice, recurrent ascites. Paracentesis requested. EXAM: ULTRASOUND GUIDED LEFT LOWER QUADRANT PARACENTESIS MEDICATIONS: 1% plain lidocaine, 5 mL COMPLICATIONS: None immediate. PROCEDURE: Informed written consent was obtained from the patient after a discussion of the risks, benefits and alternatives to treatment. A timeout was performed prior to the initiation of the procedure. Initial ultrasound scanning demonstrates  a small-moderate amount of ascites within the left lower abdominal quadrant. The left lower abdomen was prepped and draped in the usual sterile fashion. 1% lidocaine was used for local anesthesia. Following this, a 19 gauge, 7-cm, Yueh catheter was introduced. An ultrasound image was saved for documentation purposes. The paracentesis was performed. The catheter was removed and a dressing was applied. The patient tolerated the procedure well without immediate post procedural complication. FINDINGS: A total of approximately 1.2 L of clear, yellow fluid was removed. Samples were sent to the laboratory as requested by the clinical team. IMPRESSION: Successful ultrasound-guided paracentesis yielding 1.2 L liters of peritoneal fluid. Procedure performed by Brayton El PA-C PLAN: The patient has required >/=2 paracenteses in a 30 day period and a formal evaluation by the [facility] has been arranged. The patient had previously met with Dr. Gilmer Mor, on 10/24/2022, with plan for transjugular liver biopsy Roanna Banning, MD [facility] Electronically Signed   By: Roanna Banning M.D.   On:  11/13/2022 16:05   IR Paracentesis  Result Date: 11/09/2022 INDICATION: 47 year old female with history of jaundice, recurrent ascites. Paracentesis requested. EXAM: ULTRASOUND GUIDED THERAPEUTIC PARACENTESIS MEDICATIONS: 10 mL 1% lidocaine COMPLICATIONS: None immediate. PROCEDURE: Informed written consent was obtained from the patient after a discussion of the risks, benefits and alternatives to treatment. A timeout was performed prior to the initiation of the procedure. Initial ultrasound scanning demonstrates a small amount of ascites within the right lower abdominal quadrant. The right lower abdomen was prepped and draped in the usual sterile fashion. 1% lidocaine was used for local anesthesia. Following this, a 19 gauge,  7-cm, Yueh catheter was introduced. An ultrasound image was saved for documentation purposes. The paracentesis was performed. The catheter was removed and a dressing was applied. The patient tolerated the procedure well without immediate post procedural complication. FINDINGS: A total of approximately 1.8 liters of bright yellow fluid was removed. IMPRESSION: Successful ultrasound-guided paracentesis yielding 1.8 liters of peritoneal fluid. Performed by: Loyce Dys PA-C Electronically Signed   By: Gilmer Mor D.O.   On: 11/09/2022 10:04   DG Chest Port 1 View  Result Date: 11/09/2022 CLINICAL DATA:  47 year old female with shortness of breath. EXAM: PORTABLE CHEST 1 VIEW COMPARISON:  Chest CT 07/13/2015. FINDINGS: Portable AP semi upright view at 0656 hours. Mediastinal contours remain normal. Lower lung volumes. Visualized tracheal air column is within normal limits. Patchy bibasilar opacity most resembling atelectasis. No pneumothorax, pulmonary edema, pleural effusion or consolidation. Negative visible osseous structures. Paucity bowel gas in the visible abdomen. IMPRESSION: Lower lung volumes with basilar atelectasis. Electronically Signed   By: Odessa Fleming M.D.   On: 11/09/2022 07:58   CT ABDOMEN PELVIS W CONTRAST  Result Date: 11/08/2022 CLINICAL DATA:  Acute generalized abdominal pain, jaundice. EXAM: CT ABDOMEN AND PELVIS WITH CONTRAST TECHNIQUE: Multidetector CT imaging of the abdomen and pelvis was performed using the standard protocol following bolus administration of intravenous contrast. RADIATION DOSE REDUCTION: This exam was performed according to the departmental dose-optimization program which includes automated exposure control, adjustment of the mA and/or kV according to patient size and/or use of iterative reconstruction technique. CONTRAST:  75mL OMNIPAQUE IOHEXOL 350 MG/ML SOLN COMPARISON:  None Available. FINDINGS: Lower chest: Minimal bibasilar subsegmental atelectasis.  Hepatobiliary: Minimal cholelithiasis. No biliary dilatation. Minimal nodular hepatic contours are noted suggesting hepatic cirrhosis. Slightly heterogeneous hepatic parenchyma is noted, and underlying metastatic disease cannot be excluded. Pancreas: Unremarkable. No pancreatic ductal dilatation or surrounding inflammatory changes. Spleen: Mild splenomegaly is noted. Adrenals/Urinary Tract: Adrenal glands are unremarkable. Kidneys are normal, without renal calculi, focal lesion, or hydronephrosis. Bladder is unremarkable. Stomach/Bowel: Stomach is unremarkable. No abnormal bowel dilatation is noted. Wall thickening of cecum is noted as well as descending and sigmoid colon concerning for infectious or inflammatory colitis. Vascular/Lymphatic: No significant vascular findings are present. No enlarged abdominal or pelvic lymph nodes. Reproductive: Uterus and bilateral adnexa are unremarkable. Other: Mild ascites is noted. Musculoskeletal: No acute or significant osseous findings. IMPRESSION: Wall thickening of ascending, descending and sigmoid colon is noted concerning for infectious or inflammatory colitis. Mildly heterogeneous appearance of hepatic parenchyma is noted most likely related to cirrhosis, but underlying metastatic disease cannot be excluded. MRI may be considered for further evaluation. Findings consistent with hepatic cirrhosis with mild splenomegaly and mild ascites. Minimal cholelithiasis. Electronically Signed   By: Lupita Raider M.D.   On: 11/08/2022 15:58   IR Radiologist Eval & Mgmt  Result Date: 10/24/2022 EXAM: NEW PATIENT OFFICE VISIT  CHIEF COMPLAINT: Electronic medical record HISTORY OF PRESENT ILLNESS: Electronic medical record REVIEW OF SYSTEMS: Electronic medical record PHYSICAL EXAMINATION: Electronic medical record ASSESSMENT AND PLAN: Electronic medical record Electronically Signed   By: Gilmer Mor D.O.   On: 10/24/2022 09:29   US LIVER DOPPLER  Result Date:  10/19/2022 CLINICAL DATA:  47 year old female with new onset ascites EXAM: DUPLEX ULTRASOUND OF LIVER TECHNIQUE: Color and duplex Doppler ultrasound was performed to evaluate the hepatic in-flow and out-flow vessels. COMPARISON:  None Available. FINDINGS: Portal Vein Velocities Main:  32 cm/sec Right:  22 cm/sec Left:  29 cm/sec Hepatic Vein Velocities Right:  38 cm/sec Middle:  30 cm/sec Left:  23 cm/sec Hepatic Artery Velocity:  48 cm/sec Splenic Vein Velocity:  10 cm/sec Varices: Absent Ascites: Trace ascites Spleen: 11.4 cm x 6.8 cm x 6.6 cm, 266 cc Small echogenic foci scattered within the spleen parenchyma. Liver: Heterogeneous echotexture of the liver parenchyma. No high-resolution images of the surface, though appears slightly irregular. IMPRESSION: Directed duplex of the hepatic vasculature unremarkable. Heterogeneous echotexture of the liver, suggesting medical liver disease. Punctate/scattered echogenic foci of the spleen, which is borderline enlarged, can be seen with Catha Brow bodies as well as prior granulomatous disease. Electronically Signed   By: Gilmer Mor D.O.   On: 10/19/2022 11:42     TODAY-DAY OF DISCHARGE:  Subjective:   Marisa Contreras today has no headache,no chest abdominal pain,no new weakness tingling or numbness, feels much better wants to go home today.   Objective:   Blood pressure 111/78, pulse 69, temperature 98.2 F (36.8 C), resp. rate 19, height 5\' 7"  (1.702 m), weight 58 kg, SpO2 100%.  Intake/Output Summary (Last 24 hours) at 11/14/2022 1203 Last data filed at 11/13/2022 1541 Gross per 24 hour  Intake 511.92 ml  Output --  Net 511.92 ml   Filed Weights   11/08/22 1146 11/14/22 1000  Weight: 58 kg 58 kg    Exam: Awake Alert, Oriented *3, No new F.N deficits, Normal affect Byers.AT,PERRAL Supple Neck,No JVD, No cervical lymphadenopathy appriciated.  Symmetrical Chest wall movement, Good air movement bilaterally, CTAB RRR,No Gallops,Rubs or new  Murmurs, No Parasternal Heave +ve B.Sounds, Abd Soft, Non tender, No organomegaly appriciated, No rebound -guarding or rigidity. No Cyanosis, Clubbing or edema, No new Rash or bruise   PERTINENT RADIOLOGIC STUDIES: IR Paracentesis  Result Date: 11/13/2022 INDICATION: 47 year old female with history of jaundice, recurrent ascites. Paracentesis requested. EXAM: ULTRASOUND GUIDED LEFT LOWER QUADRANT PARACENTESIS MEDICATIONS: 1% plain lidocaine, 5 mL COMPLICATIONS: None immediate. PROCEDURE: Informed written consent was obtained from the patient after a discussion of the risks, benefits and alternatives to treatment. A timeout was performed prior to the initiation of the procedure. Initial ultrasound scanning demonstrates a small-moderate amount of ascites within the left lower abdominal quadrant. The left lower abdomen was prepped and draped in the usual sterile fashion. 1% lidocaine was used for local anesthesia. Following this, a 19 gauge, 7-cm, Yueh catheter was introduced. An ultrasound image was saved for documentation purposes. The paracentesis was performed. The catheter was removed and a dressing was applied. The patient tolerated the procedure well without immediate post procedural complication. FINDINGS: A total of approximately 1.2 L of clear, yellow fluid was removed. Samples were sent to the laboratory as requested by the clinical team. IMPRESSION: Successful ultrasound-guided paracentesis yielding 1.2 L liters of peritoneal fluid. Procedure performed by Brayton El PA-C PLAN: The patient has required >/=2 paracenteses in a 30 day period and  a formal evaluation by the [facility] Interventional Radiology [facility]pert[facility] has been arranged. The patient had previously met with Dr. Gilmer Mor, on 10/24/2022, with plan for transjugular liver biopsy Roanna Banning, MD [facility] Electronically Signed   By: Roanna Banning M.D.   On:  11/13/2022 16:05     PERTINENT LAB RESULTS: CBC: Recent Labs    11/13/22 0404 11/13/22 1750 11/14/22 0244  WBC 3.8*  --  3.7*  HGB 6.5* 8.2* 8.2*  HCT 19.6* 24.1* 23.9*  PLT 96*  --  100*   CMET CMP     Component Value Date/Time   NA 129 (L) 11/14/2022 0244   K 3.3 (L) 11/14/2022 0244   CL 98 11/14/2022 0244   CO2 18 (L) 11/14/2022 0244   GLUCOSE 96 11/14/2022 0244   BUN 6 11/14/2022 0244   CREATININE 1.12 (H) 11/14/2022 0244   CALCIUM 8.3 (L) 11/14/2022 0244   PROT 6.2 (L) 11/14/2022 0244   ALBUMIN 3.3 (L) 11/14/2022 0244   AST 110 (H) 11/14/2022 0244   ALT 30 11/14/2022 0244   ALKPHOS 58 11/14/2022 0244   BILITOT 4.1 (H) 11/14/2022 0244   GFR 82.29 09/17/2018 1017   GFRNONAA >60 11/14/2022 0244    GFR Estimated Creatinine Clearance: 58.1 mL/min (A) (by C-G formula based on SCr of 1.12 mg/dL (H)). No results for input(s): "LIPASE", "AMYLASE" in the last 72 hours. No results for input(s): "CKTOTAL", "CKMB", "CKMBINDEX", "TROPONINI" in the last 72 hours. Invalid input(s): "POCBNP" No results for input(s): "DDIMER" in the last 72 hours. No results for input(s): "HGBA1C" in the last 72 hours. No results for input(s): "CHOL", "HDL", "LDLCALC", "TRIG", "CHOLHDL", "LDLDIRECT" in the last 72 hours. No results for input(s): "TSH", "T4TOTAL", "T3FREE", "THYROIDAB" in the last 72 hours.  Invalid input(s): "FREET3" No results for input(s): "VITAMINB12", "FOLATE", "FERRITIN", "TIBC", "IRON", "RETICCTPCT" in the last 72 hours. Coags: Recent Labs    11/14/22 0244  INR 2.1*   Microbiology: Recent Results (from the past 240 hour(s))  SARS Coronavirus 2 by RT PCR (hospital order, performed in [facility] hospital lab) *cepheid single result test* Anterior Nasal Swab     Status: None   Collection Time: 11/08/22  7:52 PM   Specimen: Anterior Nasal Swab  Result Value Ref Range Status   SARS Coronavirus 2 by RT PCR NEGATIVE NEGATIVE Final    Comment: Performed at [facility], [address] [place], [place] [address]  Gram stain     Status: None   Collection Time: 11/13/22  3:48 PM   Specimen: Abdomen; Peritoneal Fluid  Result Value Ref Range Status   Specimen Description FLUID PERITONEAL ABDOMEN  Final   Special Requests NONE  Final   Gram Stain   Final    WBC PRESENT, PREDOMINANTLY MONONUCLEAR NO ORGANISMS SEEN CYTOSPIN SMEAR Performed at [facility], [address] [place], [place] [address]    Report Status 11/13/2022 FINAL  Final  Culture, body fluid w Gram Stain-bottle     Status: None (Preliminary result)   Collection Time: 11/13/22  3:48 PM   Specimen: Fluid  Result Value Ref Range Status   Specimen Description FLUID PERITONEAL ABDOMEN  Final   Special Requests BOTTLES DRAWN AEROBIC AND ANAEROBIC  Final   Culture   Final    NO GROWTH < 24 HOURS Performed at [facility], [address], [place], [place] [address]    Report Status PENDING  Incomplete    FURTHER DISCHARGE INSTRUCTIONS:  Get Medicines reviewed and adjusted: Please take all your medications with you for your next visit with your Primary MD  Laboratory/radiological data: Please request your Primary MD to go over all hospital tests and procedure/radiological results at the follow up, please ask your Primary MD to get all Hospital records sent to his/her office.  In some cases, they will be blood work, cultures and biopsy results pending at the time of your discharge. Please request that your primary care M.D. goes through all the records of your hospital data and follows up on these results.  Also Note the following: If you experience worsening of your admission symptoms, develop shortness of breath, life threatening emergency, suicidal or homicidal thoughts you must seek medical attention immediately by calling 911 or calling your MD immediately  if symptoms less severe.  You must read complete instructions/literature along with all the possible adverse  reactions/side effects for all the Medicines you take and that have been prescribed to you. Take any new Medicines after you have completely understood and accpet all the possible adverse reactions/side effects.   Do not drive when taking Pain medications or sleeping medications (Benzodaizepines)  Do not take more than prescribed Pain, Sleep and Anxiety Medications. It is not advisable to combine anxiety,sleep and pain medications without talking with your primary care practitioner  Special Instructions: If you have smoked or chewed Tobacco  in the last 2 yrs please stop smoking, stop any regular Alcohol  and or any Recreational drug use.  Wear Seat belts while driving.  Please note: You were cared for by a hospitalist during your hospital stay. Once you are discharged, your primary care physician will handle any further medical issues. Please note that NO REFILLS for any discharge medications will be authorized once you are discharged, as it is imperative that you return to your primary care physician (or establish a relationship with a primary care physician if you do not have one) for your post hospital discharge needs so that they can reassess your need for medications and monitor your lab values.  Total Time spent coordinating discharge including counseling, education and face to face time equals greater than 30 minutes.  SignedJeoffrey Massed 11/14/2022 12:03 PM

## 2022-11-15 ENCOUNTER — Telehealth: Payer: Self-pay

## 2022-11-15 LAB — CYTOLOGY - NON PAP

## 2022-11-15 NOTE — Transitions of Care (Post Inpatient/ED Visit) (Signed)
   11/15/2022  Name: Marisa Contreras MRN: [id number] DOB: August 03, 1975  Today's TOC FU Call Status: Today's TOC FU Call Status:: Unsuccessful Call (1st Attempt) Unsuccessful Call (1st Attempt) Date: 11/15/22  Attempted to reach the patient regarding the most recent Inpatient/ED visit.  Follow Up Plan: Additional outreach attempts will be made to reach the patient to complete the Transitions of Care (Post Inpatient/ED visit) call.    Gizella Belleville, CMA  [facility] Team Direct Dial: [phone]

## 2022-11-16 LAB — LIPASE, FLUID: Lipase-Fluid: 21 U/L

## 2022-11-16 LAB — VITAMIN B1: Vitamin B1 (Thiamine): 35.4 nmol/L — ABNORMAL LOW (ref 66.5–200.0)

## 2022-11-18 LAB — CULTURE, BODY FLUID W GRAM STAIN -BOTTLE: Culture: NO GROWTH

## 2022-11-19 LAB — HEMOCHROMATOSIS DNA-PCR(C282Y,H63D)

## 2022-11-20 LAB — HEMOCHROMATOSIS DNA-PCR(C282Y,H63D)

## 2022-11-21 ENCOUNTER — Telehealth: Payer: Self-pay

## 2022-11-21 NOTE — Telephone Encounter (Signed)
-----   Message from Jenel Lucks sent at 11/14/2022  1:03 PM EST ----- Regarding: Outpatient follow up VN,  This patient is going home today.  She has decompensated alcoholic cirrhosis (ascites, HE, nonbleeding varices).  We are sending her out on rifaximin/lactulose, low dose aldactone (Na 129, K 3) and midodrine.  Holding NSBB due to low BP.  Reportedly got hypotense with lasix, but may want to try to add back as an outpatient.  She has a high MELD, but not currently a transplant candidate due to recent alcohol use (last drink was shortly before admission).  I would recommend a repeat CBC, CMP and INR in 1 week.    I think she would benefit from close follow-up and possible initiation of naltrexone to help with abstinence from alcohol.  Thanks,  [place]

## 2022-11-22 NOTE — Telephone Encounter (Signed)
Follow up is scheduled for 11/26/22.
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
## 2022-11-26 ENCOUNTER — Ambulatory Visit (INDEPENDENT_AMBULATORY_CARE_PROVIDER_SITE_OTHER)
Admission: RE | Admit: 2022-11-26 | Discharge: 2022-11-26 | Disposition: A | Discharge status: Home / Self Care | Payer: 59 | Source: Ambulatory Visit | Attending: Nurse Practitioner | Admitting: Nurse Practitioner

## 2022-11-26 ENCOUNTER — Other Ambulatory Visit (INDEPENDENT_AMBULATORY_CARE_PROVIDER_SITE_OTHER): Payer: 59

## 2022-11-26 ENCOUNTER — Encounter: Payer: Self-pay | Admitting: Nurse Practitioner

## 2022-11-26 ENCOUNTER — Ambulatory Visit: Payer: 59 | Admitting: Nurse Practitioner

## 2022-11-26 VITALS — BP 124/70 | HR 68 | Ht 67.0 in | Wt 136.0 lb

## 2022-11-26 DIAGNOSIS — K7460 Unspecified cirrhosis of liver: Secondary | ICD-10-CM

## 2022-11-26 DIAGNOSIS — R0602 Shortness of breath: Secondary | ICD-10-CM

## 2022-11-26 DIAGNOSIS — D696 Thrombocytopenia, unspecified: Secondary | ICD-10-CM | POA: Diagnosis not present

## 2022-11-26 DIAGNOSIS — R188 Other ascites: Secondary | ICD-10-CM | POA: Diagnosis not present

## 2022-11-26 DIAGNOSIS — R161 Splenomegaly, not elsewhere classified: Secondary | ICD-10-CM

## 2022-11-26 DIAGNOSIS — D649 Anemia, unspecified: Secondary | ICD-10-CM

## 2022-11-26 LAB — HEPATIC FUNCTION PANEL
ALT: 32 U/L (ref 0–35)
AST: 94 U/L — ABNORMAL HIGH (ref 0–37)
Albumin: 3.7 g/dL (ref 3.5–5.2)
Alkaline Phosphatase: 68 U/L (ref 39–117)
Bilirubin, Direct: 1.1 mg/dL — ABNORMAL HIGH (ref 0.0–0.3)
Total Bilirubin: 3 mg/dL — ABNORMAL HIGH (ref 0.2–1.2)
Total Protein: 7.2 g/dL (ref 6.0–8.3)

## 2022-11-26 LAB — BASIC METABOLIC PANEL
BUN: 6 mg/dL (ref 6–23)
CO2: 20 meq/L (ref 19–32)
Calcium: 9.3 mg/dL (ref 8.4–10.5)
Chloride: 101 meq/L (ref 96–112)
Creatinine, Ser: 1.11 mg/dL (ref 0.40–1.20)
GFR: 59.83 mL/min — ABNORMAL LOW (ref >60.00)
Glucose, Bld: 94 mg/dL (ref 70–99)
Potassium: 3.9 meq/L (ref 3.5–5.1)
Sodium: 132 meq/L — ABNORMAL LOW (ref 135–145)

## 2022-11-26 LAB — CBC WITH DIFFERENTIAL/PLATELET
Basophils Absolute: 0.1 10*3/uL (ref 0.0–0.1)
Basophils Relative: 1.5 % (ref 0.0–3.0)
Eosinophils Absolute: 0.1 10*3/uL (ref 0.0–0.7)
Eosinophils Relative: 2.1 % (ref 0.0–5.0)
HCT: 29.4 % — ABNORMAL LOW (ref 36.0–46.0)
Hemoglobin: 10 g/dL — ABNORMAL LOW (ref 12.0–15.0)
Lymphocytes Relative: 21.1 % (ref 12.0–46.0)
Lymphs Abs: 1.2 10*3/uL (ref 0.7–4.0)
MCHC: 33.9 g/dL (ref 30.0–36.0)
MCV: 101.7 fL — ABNORMAL HIGH (ref 78.0–100.0)
Monocytes Absolute: 0.6 10*3/uL (ref 0.1–1.0)
Monocytes Relative: 10.7 % (ref 3.0–12.0)
Neutro Abs: 3.7 10*3/uL (ref 1.4–7.7)
Neutrophils Relative %: 64.6 % (ref 43.0–77.0)
Platelets: 188 10*3/uL (ref 150.0–400.0)
RBC: 2.89 Mil/uL — ABNORMAL LOW (ref 3.87–5.11)
RDW: 18.7 % — ABNORMAL HIGH (ref 11.5–15.5)
WBC: 5.8 10*3/uL (ref 4.0–10.5)

## 2022-11-26 LAB — PROTIME-INR
INR: 1.6 {ratio} — ABNORMAL HIGH (ref 0.8–1.0)
Prothrombin Time: 16.2 s — ABNORMAL HIGH (ref 9.6–13.1)

## 2022-11-26 MED ORDER — PANTOPRAZOLE SODIUM 40 MG PO TBEC
40.0000 mg | DELAYED_RELEASE_TABLET | Freq: Two times a day (BID) | ORAL | 2 refills | Status: AC
Start: 2022-11-26 — End: ?
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
## 2022-11-26 NOTE — Patient Instructions (Addendum)
Your provider has requested that you go to the basement level for lab work before leaving today. Press "B" on the elevator. The lab is located at the first door on the left as you exit the elevator.  Hold Lactulose/ Constulose for 24 hours. Then take once daily then stop if you have 4-5 bowel movements daily.  Your provider has requested that you have an abdominal x ray before leaving today. Please go to the basement floor to our Radiology department for the test.  Complete alcohol abstinence.  Follow up in 1 month.  Due to recent changes in healthcare laws, you may see the results of your imaging and laboratory studies on MyChart before your provider has had a chance to review them.  We understand that in some cases there may be results that are confusing or concerning to you. Not all laboratory results come back in the same time frame and the provider may be waiting for multiple results in order to interpret others.  Please give [place] 48 hours in order for your provider to thoroughly review all the results before contacting the office for clarification of your results.   Thank you for trusting me with your gastrointestinal care!   Alcide Evener, CRNP

## 2022-11-27 NOTE — Progress Notes (Signed)
Marisa Contreras,  1) pls contact patient and instruct her to continue Spironolactone 25mg  one tab po every day as previously prescribed.  2)Pls have her start Furosemide 10mg  one tab po every day # 30, no refills. Send RX to pharmacy.  3)Pls send patient back to our lab in 1 week for a BMP and TSH level.  4)If she is having more than 3-4 loose stools after reducing Lactulose to once daily she should stop it 5) See if she can check her own blood pressures at home, let me know  6) Pls have patient follow up with her PCP regarding her hypothyroidism, TSH level in the hospital showed significant hypothyroidism, will need PCP to manage Levothyroxine dosing.   7) Please make sure she picked up the RX for Pantoprazole 40mg  bid as ordered yesterday during her office visit.   THX>

## 2022-11-27 NOTE — Progress Notes (Signed)
Agree with the assessment and plan as outlined by Alcide Evener, NP.  I think adding 10 mg PO lasix would be reasonable given patient's good blood pressure in clinic and much improved hgb and hyponatremia since admission.  Would ask patient to monitor her blood pressure at home.  Agree with holding lactulose (for at least 24-48hrs) and would reconsider discontinuing completely in favor or rifaximin monotherapy if encephalopathy not worsened with rifaximin monotherapy. No urgent need to restart nadolol based on low risk varices.  This is something that can be reconsidered in 3-4 months.   Laprecious Austill E. Tomasa Rand, MD [facility] Gastroenterology

## 2022-11-27 NOTE — Progress Notes (Signed)
See addendum note to office visit 11/26/2022.

## 2022-11-28 ENCOUNTER — Telehealth: Payer: Self-pay

## 2022-11-28 ENCOUNTER — Other Ambulatory Visit: Payer: Self-pay

## 2022-11-28 DIAGNOSIS — R188 Other ascites: Secondary | ICD-10-CM

## 2022-11-28 DIAGNOSIS — E039 Hypothyroidism, unspecified: Secondary | ICD-10-CM

## 2022-11-28 MED ORDER — FUROSEMIDE 20 MG PO TABS: 10.0000 mg | ORAL_TABLET | Freq: Every day | ORAL | 0 refills | Status: DC

## 2022-11-28 NOTE — Telephone Encounter (Signed)
Correction to note below, RX is for Furosemide 20mg  tab patient to take 1/2 tab (10mg ) po every day. Repeat hepatic panel in 1 week.

## 2022-11-28 NOTE — Telephone Encounter (Signed)
Pt was contacted in regard to Alcide Evener NP recommendations:  1) Pt made aware to  continue Spironolactone 25mg  one tab po every day as previously prescribed.   2) Pt notified to  start Furosemide 10mg  1/2 tab po every day # 15, no refills. Prescription sent to pharmacy Pt made aware.   3)Pt notified to come back to our lab in 1 week  for a BMP and TSH level. Orders for labs placed in Epic   4)Pt notified that if she has more than 3-4 loose stools after reducing Lactulose to once daily she should stop it  5) Pt stated that she does check her BP at Home.    6) Pt notified to  follow up with her PCP regarding her hypothyroidism, TSH level in the hospital showed significant hypothyroidism, will need PCP to manage Levothyroxine dosing.    7) Pt stated that she has  picked up the RX for Pantoprazole 40mg  bid as ordered yesterday during her office visit.    Routed as [employer]

## 2022-11-28 NOTE — Telephone Encounter (Signed)
Message Received: Marisa Contreras, Marisa Carl, NP  Marisa Darling, RN      Previous Messages  Routed Note  Author: Arnaldo Natal, NP Service: Gastroenterology Author Type: Nurse Practitioner  Filed: 11/27/2022  1:06 PM Encounter Date: 11/26/2022 Status: Signed  Editor: Arnaldo Natal, NP (Nurse Practitioner)  [name],  1) pls contact patient and instruct her to continue Spironolactone 25mg  one tab po every day as previously prescribed.  2)Pls have her start Furosemide 10mg  one tab po every day # 30, no refills. Send RX to pharmacy.  3)Pls send patient back to our lab in 1 week for a BMP and TSH level.   4)If she is having more than 3-4 loose stools after reducing Lactulose to once daily she should stop it 5) See if she can check her own blood pressures at home, let me know   6) Pls have patient follow up with her PCP regarding her hypothyroidism, TSH level in the hospital showed significant hypothyroidism, will need PCP to manage Levothyroxine dosing.    7) Please make sure she picked up the RX for Pantoprazole 40mg  bid as ordered yesterday during her office visit.    THX>     Office Visit 11/26/2022 Arnaldo Natal, NP - [facility] Gastroenterology Diagnoses  Cirrhosis Of Liver With Ascites, Unspecified Hepatic Cirrhosis Type (hcc) (Primary) Anemia, unspecified type Shortness of breath Orders Signed This Visit  (7) Orders Pended This Visit  None Progress Notes  Arnaldo Natal, NP at 11/26/2022  9:00 AM  Status: Signed  [name],  1) pls contact patient and instruct her to continue Spironolactone 25mg  one tab po every day as previously prescribed.  2)Pls have her start Furosemide 10mg  one tab po every day # 30, no refills. Send RX to pharmacy.  3)Pls send patient back to our lab in 1 week for a BMP and TSH level.   4)If she is having more than 3-4 loose stools after reducing Lactulose to once daily she should stop  it 5) See if she can check her own blood pressures at home, let me know   6) Pls have patient follow up with her PCP regarding her hypothyroidism, TSH level in the hospital showed significant hypothyroidism, will need PCP to manage Levothyroxine dosing.    7) Please make sure she picked up the RX for Pantoprazole 40mg  bid as ordered yesterday during her office visit.    THX>    Jenel Lucks, MD at 11/26/2022  9:00 AM  Status: Signed  Agree with the assessment and plan as outlined by Alcide Evener, NP.  I think adding 10 mg PO lasix would be reasonable given patient's good blood pressure in clinic and much improved hgb and hyponatremia since admission.  Would ask patient to monitor her blood pressure at home.  Agree with holding lactulose (for at least 24-48hrs) and would reconsider discontinuing completely in favor or rifaximin monotherapy if encephalopathy not worsened with rifaximin monotherapy. No urgent need to restart nadolol based on low risk varices.  This is something that can be reconsidered in 3-4 months.

## 2022-12-06 ENCOUNTER — Other Ambulatory Visit (INDEPENDENT_AMBULATORY_CARE_PROVIDER_SITE_OTHER): Payer: 59

## 2022-12-06 ENCOUNTER — Other Ambulatory Visit: Payer: Self-pay

## 2022-12-06 DIAGNOSIS — D649 Anemia, unspecified: Secondary | ICD-10-CM

## 2022-12-06 DIAGNOSIS — K7460 Unspecified cirrhosis of liver: Secondary | ICD-10-CM

## 2022-12-06 DIAGNOSIS — R188 Other ascites: Secondary | ICD-10-CM

## 2022-12-06 DIAGNOSIS — R0602 Shortness of breath: Secondary | ICD-10-CM

## 2022-12-06 DIAGNOSIS — E039 Hypothyroidism, unspecified: Secondary | ICD-10-CM

## 2022-12-06 LAB — TSH: TSH: 30.12 u[IU]/mL — ABNORMAL HIGH (ref 0.35–5.50)

## 2022-12-06 LAB — BASIC METABOLIC PANEL
BUN: 8 mg/dL (ref 6–23)
CO2: 24 meq/L (ref 19–32)
Calcium: 8.8 mg/dL (ref 8.4–10.5)
Chloride: 96 meq/L (ref 96–112)
Creatinine, Ser: 1.08 mg/dL (ref 0.40–1.20)
GFR: 61.82 mL/min (ref >60.00)
Glucose, Bld: 102 mg/dL — ABNORMAL HIGH (ref 70–99)
Potassium: 3.5 meq/L (ref 3.5–5.1)
Sodium: 129 meq/L — ABNORMAL LOW (ref 135–145)

## 2022-12-10 LAB — COPPER, URINE, 24 HOUR
Copper,Urine (24 Hr): <5 ug/(24.h) — ABNORMAL LOW (ref 15–60)
Total Volume: 700 mL

## 2022-12-20 ENCOUNTER — Other Ambulatory Visit (HOSPITAL_COMMUNITY): Payer: Self-pay

## 2023-03-01 ENCOUNTER — Encounter: Payer: Self-pay | Admitting: Nurse Practitioner

## 2023-03-01 ENCOUNTER — Ambulatory Visit (INDEPENDENT_AMBULATORY_CARE_PROVIDER_SITE_OTHER): Payer: 59 | Admitting: Nurse Practitioner

## 2023-03-01 VITALS — BP 138/80 | HR 77 | Ht 67.0 in | Wt 122.2 lb

## 2023-03-01 DIAGNOSIS — D649 Anemia, unspecified: Secondary | ICD-10-CM | POA: Diagnosis not present

## 2023-03-01 DIAGNOSIS — K219 Gastro-esophageal reflux disease without esophagitis: Secondary | ICD-10-CM

## 2023-03-01 DIAGNOSIS — K7460 Unspecified cirrhosis of liver: Secondary | ICD-10-CM | POA: Diagnosis not present

## 2023-03-01 DIAGNOSIS — K2100 Gastro-esophageal reflux disease with esophagitis, without bleeding: Secondary | ICD-10-CM | POA: Diagnosis not present

## 2023-03-01 NOTE — Patient Instructions (Addendum)
 Follow up with Dr.Cunningham in May.  Labs and liver imaging due in May.  No alcohol ever.  Protein 1.5 grams/kg a day  We have sent over your referral to [facility]. You may receive a call within the next few weeks regarding an appointment.  Due to recent changes in healthcare laws, you may see the results of your imaging and laboratory studies on MyChart before your provider has had a chance to review them.  We understand that in some cases there may be results that are confusing or concerning to you. Not all laboratory results come back in the same time frame and the provider may be waiting for multiple results in order to interpret others.  Please give [place] 48 hours in order for your provider to thoroughly review all the results before contacting the office for clarification of your results.    Thank you for trusting me with your gastrointestinal care!   Alcide Evener, CRNP

## 2023-03-01 NOTE — Progress Notes (Signed)
 03/01/2023 Marisa Contreras [id number] 1975-12-29   Chief Complaint: Cirrhosis follow up  History of Present Illness: Marisa Contreras is a 48 year old female with a past medical history of hypertension, hypothyroidism, reflux esophagitis, iron deficiency anemia, colonic AVMs, decompensated alcohol associated cirrhosis with esophageal varices, portal hypertensive gastropathy, hepatic encephalopathy and thrombocytopenia.  Refer to office visit 11/26/2022 for comprehensive history review. At that time, she was prescribed Furosemide 10mg  every day and Spironolactone 25mg  every day was continued. Xifaxan 550mg  bid, Lactulose 20gm bid to titrate to no more than 3 to 4 looses stools, and Pantoprazole 40mg  bid were also continued.   She presents today for cirrhosis follow up. She remains abstinent from alcohol since 11/08/2022. She denies having any confusion and stopped taking Lactulose around the end of Dec. 2024 due to excessive loose stools when she took it, even when she reduced to once daily. She stopped taking Xifaxan Jan or Feb 2025 as instructed by her PCP "because my numbers looked good".  She is passing a normal  brown formed stool daily. No bloody or black stools. No N/V. GERD symptoms abated. No abdominal pain. No abdominal or leg swelling. Urine is normal yellow. No pruritus. She is no longer taking Spironolactone 25mg  every day or Furosemide 10mg  every day, it is unclear when she stopped taking these diuretics. She was taking Pantoprazole 40mg  bid, decreased to every day within the past week. She remains on Folic acid 1mg  daily, no longer taking Thiamin. She has lost weight which she attributes fluid loss, no further abdominal or leg swelling. Nadolol was previously deferred secondary to hypotension, required Midodrine which she is no longer taking. She was previously referred to [facility], appointment not scheduled.   Labs 02/22/2023: WBC 3.8. Hg 10.6. HCT 31.4. MCV 88.2. PLT 163.  TSH 0.06.  HgA1c 4.9. Vitamin B12 516. Folate > 24. Na+ 133. Cr 1.13.  BUN 24. Calcium 10.8. Albumin 4. Alk phos 148. T. Bili 1.0. AST 26. ALT 18. Iron 99. Ferritin 177.      Latest Ref Rng & Units 11/26/2022   10:43 AM 11/14/2022    2:44 AM 11/13/2022    5:50 PM  CBC  WBC 4.0 - 10.5 K/uL 5.8  3.7    Hemoglobin 12.0 - 15.0 g/dL 40.9  8.2  8.2   Hematocrit 36.0 - 46.0 % 29.4  23.9  24.1   Platelets 150.0 - 400.0 K/uL 188.0  100           Latest Ref Rng & Units 12/06/2022    8:00 AM 11/26/2022   10:43 AM 11/14/2022    2:44 AM  CMP  Glucose 70 - 99 mg/dL 811  94  96   BUN 6 - 23 mg/dL 8  6  6    Creatinine 0.40 - 1.20 mg/dL 9.14  7.82  9.56   Sodium 135 - 145 mEq/L 129  132  129   Potassium 3.5 - 5.1 mEq/L 3.5  3.9  3.3   Chloride 96 - 112 mEq/L 96  101  98   CO2 19 - 32 mEq/L 24  20  18    Calcium 8.4 - 10.5 mg/dL 8.8  9.3  8.3   Total Protein 6.0 - 8.3 g/dL  7.2  6.2   Total Bilirubin 0.2 - 1.2 mg/dL  3.0  4.1   Alkaline Phos 39 - 117 U/L  68  58   AST 0 - 37 U/L  94  110   ALT  0 - 35 U/L  32  30      MELD 3.0: 21 at 11/26/2022 10:43 AM MELD-Na: 21 at 11/26/2022 10:43 AM Calculated from: Serum Creatinine: 1.11 mg/dL at 96/04/5407 81:19 AM Serum Sodium: 132 mEq/L at 11/26/2022 10:43 AM Total Bilirubin: 3 mg/dL at 14/78/2956 21:30 AM Serum Albumin: 3.7 g/dL (Using max of 3.5 g/dL) at 86/57/8469 62:95 AM INR(ratio): 1.6 ratio at 11/26/2022 10:43 AM Age at listing (hypothetical): 46 years Sex: Female at 11/26/2022 10:43 AM  Labs 11/09/2022: AFP 4.5.  Hemochromatosis DNA (c282y and h63d) were not detected. MELD 3.0: 26. Urine sodium level < 10.   Labs 11/10/2022: ANA negative. SMA 9.  Ceruloplasmin low at 15.9 (low suspicion for Wilson's disease, 24 hour copper level planned as an outpatient).  Alpha 1 antitrypsin 183.   Paracentesis 11/09/2022: 8 L of peritoneal fluid was removed, labs were not done.   Paracentesis 11/13/2022: No SBP. Ascitic albumin < 1.5. Serum Albumin  3.0. SAAG > 1.1 consistent with portal hypertension.  Cytology showed reactive mesothelial cells, no malignant cells.  Peritoneal fluid cultures negative day 5.   EGD 11/09/2022 as an inpatient by Dr. Lavon Paganini: - Grade I esophageal varices. - LA Grade C reflux esophagitis with no bleeding.  - Portal hypertensive gastropathy. Biopsied.  - Normal examined duodenum. A. STOMACH, BIOPSY:       Gastric antral mucosa with reactive epithelial changes, lamina  propria edema and vascular dilation suggestive for portal hypertensive  gastropathy.      Gastric oxyntic mucosa without significant diagnostic alteration.       No H. pylori identified on HE stain.       Negative for intestinal metaplasia or dysplasia.    Colonoscopy 11/13/20224 as an inpatient by Dr. Tomasa Rand: - Hemorrhoids found on perianal exam.  - Two non-bleeding colonic angiodysplastic lesions. Treated with argon plasma coagulation (APC). This is a potential source of GI bleeding/positive FOBT.  - The examined portion of the ileum was normal.  - Non-bleeding internal hemorrhoids.  - No specimens collected.  -Recall screening colonoscopy 10 years.  Current Outpatient Medications on File Prior to Visit  Medication Sig Dispense Refill   folic acid (FOLVITE) 1 MG tablet Take 1 tablet (1 mg total) by mouth daily. 30 tablet 3   levothyroxine (SYNTHROID) 100 MCG tablet Take 50 mcg by mouth daily before breakfast.     pantoprazole (PROTONIX) 40 MG tablet Take 1 tablet (40 mg total) by mouth 2 (two) times daily. Take 30 minutes before breakfast & dinner. (Patient taking differently: Take 40 mg by mouth daily. Take 30 minutes before breakfast & dinner.) 60 tablet 2   Prenatal MV & Min w/FA-DHA (PRENATAL ADULT GUMMY/DHA/FA PO) Take 2 each by mouth daily.     valsartan (DIOVAN) 40 MG tablet Take 40 mg by mouth daily.     thiamine (VITAMIN B1) 100 MG tablet Take 1 tablet (100 mg total) by mouth daily. (Patient not taking: Reported on  03/01/2023) 30 tablet 2   No current facility-administered medications on file prior to visit.   No Known Allergies   Current Medications, Allergies, Past Medical History, Past Surgical History, Family History and Social History were reviewed in [employer] record.  Review of Systems:   Constitutional: + Weight loss.  Respiratory: Negative for shortness of breath.   Cardiovascular: Negative for chest pain, palpitations and leg swelling.  Gastrointestinal: See HPI.  Musculoskeletal: Negative for back pain or muscle aches.  Neurological: Negative for dizziness, headaches or  paresthesias.   Physical Exam: BP 138/80 (BP Location: Left Arm, Patient Position: Sitting, Cuff Size: Normal)   Pulse 77   Ht 5\' 7"  (1.702 m)   Wt 122 lb 3.2 oz (55.4 kg)   LMP  (LMP Unknown)   BMI 19.14 kg/m   Wt Readings from Last 3 Encounters:  03/01/23 122 lb 3.2 oz (55.4 kg)  11/26/22 136 lb (61.7 kg)  11/14/22 127 lb 13.9 oz (58 kg)    LMP  (LMP Unknown)  General: 48 year old female in no acute distress. Head: Normocephalic and atraumatic. Eyes: No scleral icterus. Conjunctiva pink . Ears: Normal auditory acuity. Mouth: Dentition intact. No ulcers or lesions.  Lungs: Clear throughout to auscultation. Heart: Regular rate and rhythm, no murmur. Abdomen: Soft, nontender and nondistended. No masses or hepatomegaly. Normal bowel sounds x 4 quadrants.  Rectal: Deferred.  Musculoskeletal: Symmetrical with no gross deformities. Extremities: No edema. Neurological: Alert oriented x 4. No focal deficits. No asterixis.  Psychological: Alert and cooperative. Normal mood and affect  Assessment and Recommendations:  48 year old female with decompensated cirrhosis (suspected alcohol associated) with esophageal varices, portal hypertensive gastropathy, ascites and hepatic encephalopathy. MELD 3.0: 21.  CTAP 11/08/2022 identified cirrhosis, slightly heterogeneous hepatic parenchyma  (underlying metastatic disease could not be excluded), mild splenomegaly and ascites. Ascites and LE edema abated, patient self discontinued Spironolactone and Furosemide. No overt signs of hepatic encephalopathy. Patient stopped taking Lactulose secondary to frequent loose stools and self discontinued Xifaxan.  -Maintain 2gm low sodium diet -Increase protein intake to 1.5gm/kg/day -Resume Xifaxan 550mg  bid  -No longer hypotensive, off Midodrine, ? Start Nadolol for EV -Refer to [facility]  -Abdominal MRI, CBC, CMP, PT/INR and AFP due May 2025, update MELD score after labs obtained. (Patient had labs done by PCP 02/22/2023 which did not include PT/INR therefore MELD score could not be updated, patient did not wish to have labs for MELD score today) -Follow up with Dr. Tomasa Rand 05/2023 after completing labs and abdominal MRI -Dr. Tomasa Rand to very recall date for EV surveillance EGD -No NSAIDs -Continue Folate 1mg  every day -Resume Thiamin 100mg  one tab po every day OTC -Patient counseled no alcohol ever  GERD. Grade C reflux esophagitis EGD 11/09/2022 showed grade 1 esophageal varices and reflux esophagitis.  -Continue Pantoprazole 40mg  QD   Anemia, splenomegaly a contributing factor. Stable Hg 10.6. No overt GI bleeding. EGD 11/09/2022 showed grade 1 esophageal varices and reflux esophagitis.  Colonoscopy 11/14/2022 showed 2 nonbleeding AVMs.    Thrombocytopenia secondary to cirrhosis, splenomegaly   CT abdomen/pelvis showed colitis involving ascending/descending/sigmoid colon, likely portal hypertensive colopathy. Colonoscopy Colonoscopy 11/14/2022 showed 2 nonbleeding AVMs, no colitis/IBD.

## 2023-03-04 ENCOUNTER — Telehealth: Payer: Self-pay

## 2023-03-04 NOTE — Progress Notes (Signed)
 Agree with the assessment and plan as outlined by Alcide Evener, NP.  Patient appears to have had significant improvement with alcohol cessation.  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  No suggestion of ongoing HE based on note.  Unclear if patient absolutely needs to continue rifaximin if cirrhosis has recompensated following alcohol cessation.  If patient has not had any symptoms of HE after self-discontinuing rifaximin, it may be reasonable to continue to observe.  Same with diuretics.  If she has not been taking diuretics but has no evidence of lower extremity edema, ascites or hepatic hydrothorax, then ok to hold off on it.   With regards to variceal screening, her varices were small/low risk; would recommend repeating EGD in 1-2 years.  Ok to hold off on nonselective beta blockers given low risk of bleeding. However, she had LA Grade C esophagitis, and it is recommended to repeat EGD to assess healing of the esophagitis.  Therefore I would recommend she undergo repeat EGD for that reason.   Marisa Contreras E. Tomasa Rand, MD [facility]

## 2023-03-04 NOTE — Telephone Encounter (Signed)
-----   Message from Arnaldo Natal sent at 03/03/2023  9:26 AM EST ----- Dr. Tomasa Rand, pls verify when EV surveillance Egd due and if you want her to start Nadolol (not sure she will be adherent to taking).  Marisa Contreras, pls contact patient and as we discussed during her office visit, I recommend for her to restart Xifaxan 550mg  bid, she stated she had a supply at home, ok to refill when needed. THX.

## 2023-03-04 NOTE — Telephone Encounter (Signed)
 Pt notified of Alcide Evener NP recommendations to restart Xifaxan 550mg  twice a day.  Pt did state that she had a supply at home. Pt was notified to call office when she needs a refill.  Pt verbalized understanding with all questions answered.

## 2023-03-06 NOTE — Telephone Encounter (Signed)
 Patient called and stated that she does not like how Xifaxan 550mg  was making her feel. Patient stated that she was having headaches,insomnia and itching. Patient would like to be put on something else. Patient is requesting a call back. Please advise.

## 2023-03-06 NOTE — Telephone Encounter (Signed)
 Pt stated that she started taking the Xifaxan 2 days ago and yesterday she had headaches,insomnia and itching . Pt requesting something else be prescribed. Please review and advise.

## 2023-03-06 NOTE — Telephone Encounter (Signed)
 Marisa Contreras, pls contact patient, agree, patient to stop Xifaxan. Pls enter Xifaxan to medication allergy list with associated reaction of itchiness.  Since she has stopped all alcohol use and she did not demonstrate any active signs of hepatic encephalopathy during her recent office visit, she does not need to take an alternative med to replace Xifaxan. Refer to Dr. Milas Hock addendum to recent office visit note.

## 2023-03-06 NOTE — Telephone Encounter (Signed)
 Left message for pt to call back

## 2023-03-08 NOTE — Telephone Encounter (Signed)
 Left message for pt to call back

## 2023-03-11 NOTE — Telephone Encounter (Signed)
 Pt made aware of Alcide Evener NP recommendations. Xifaxan added to medication allergy list with associated reaction of itchiness.  Pt made aware. Pt verbalized understanding with all questions answered.

## 2023-05-06 ENCOUNTER — Telehealth: Payer: Self-pay | Admitting: Nurse Practitioner

## 2023-05-06 DIAGNOSIS — K7460 Unspecified cirrhosis of liver: Secondary | ICD-10-CM

## 2023-05-06 NOTE — Telephone Encounter (Signed)
 DD, pls contact patient, she is due for labs and liver MRI. Pls send her to our lab for a CBC, CMP, PT/INR and AFP. Pls schedule her for an abdominal MRI with and without contrast. DX: Cirrhosis.

## 2023-05-06 NOTE — Telephone Encounter (Signed)
 Orders have been entered. Attempted to contact patient and left a voicemail for patient regarding returning to our lab. Staff message was sent to central scheduling to contact patient regarding MRI.

## 2023-05-18 LAB — LAB REPORT - SCANNED: EGFR: 64

## 2023-05-29 ENCOUNTER — Telehealth: Payer: Self-pay

## 2023-05-29 NOTE — Telephone Encounter (Signed)
 Contacted patient to confirm she was aware of appointment and that she was due for labs and a MRI. Patient stated that she has recent labs done that she would bring with her to her appointment. Patient stated that she did not scheduled MRI because she was not told what it was for. Patient had no question at the end of call and stated that she would be at her appointment tomorrow.

## 2023-05-30 ENCOUNTER — Encounter: Payer: Self-pay | Admitting: Gastroenterology

## 2023-05-30 ENCOUNTER — Other Ambulatory Visit (INDEPENDENT_AMBULATORY_CARE_PROVIDER_SITE_OTHER)

## 2023-05-30 ENCOUNTER — Ambulatory Visit (INDEPENDENT_AMBULATORY_CARE_PROVIDER_SITE_OTHER): Payer: 59 | Admitting: Gastroenterology

## 2023-05-30 VITALS — BP 126/70 | HR 76 | Ht 67.0 in | Wt 119.0 lb

## 2023-05-30 DIAGNOSIS — D649 Anemia, unspecified: Secondary | ICD-10-CM | POA: Diagnosis not present

## 2023-05-30 DIAGNOSIS — K7031 Alcoholic cirrhosis of liver with ascites: Secondary | ICD-10-CM | POA: Diagnosis not present

## 2023-05-30 DIAGNOSIS — K5520 Angiodysplasia of colon without hemorrhage: Secondary | ICD-10-CM | POA: Diagnosis not present

## 2023-05-30 DIAGNOSIS — K3189 Other diseases of stomach and duodenum: Secondary | ICD-10-CM

## 2023-05-30 DIAGNOSIS — K644 Residual hemorrhoidal skin tags: Secondary | ICD-10-CM

## 2023-05-30 DIAGNOSIS — K2100 Gastro-esophageal reflux disease with esophagitis, without bleeding: Secondary | ICD-10-CM | POA: Diagnosis not present

## 2023-05-30 DIAGNOSIS — K766 Portal hypertension: Secondary | ICD-10-CM

## 2023-05-30 DIAGNOSIS — F1091 Alcohol use, unspecified, in remission: Secondary | ICD-10-CM

## 2023-05-30 LAB — VITAMIN B12: Vitamin B-12: 439 pg/mL (ref 211–911)

## 2023-05-30 LAB — VITAMIN D 25 HYDROXY (VIT D DEFICIENCY, FRACTURES): VITD: 49.07 ng/mL (ref 30.00–100.00)

## 2023-05-30 LAB — IBC + FERRITIN
Ferritin: 73.1 ng/mL (ref 10.0–291.0)
Iron: 94 ug/dL (ref 42–145)
Saturation Ratios: 21 % (ref 20.0–50.0)
TIBC: 448 ug/dL (ref 250.0–450.0)
Transferrin: 320 mg/dL (ref 212.0–360.0)

## 2023-05-30 LAB — GAMMA GT: GGT: 106 U/L — ABNORMAL HIGH (ref 7–51)

## 2023-05-30 LAB — FOLATE: Folate: >23.2 ng/mL (ref >5.9)

## 2023-05-30 NOTE — Patient Instructions (Signed)
 Your provider has requested that you go to the basement level for lab work before leaving today. Press "B" on the elevator. The lab is located at the first door on the left as you exit the elevator.  You have been scheduled for an abdominal ultrasound at [facility] (1st floor of hospital) on 06/05/23 at 9:30am. Please arrive 15 minutes prior to your appointment for registration. Make certain not to have anything to eat or drink 6 hours prior to your appointment. Should you need to reschedule your appointment, please contact radiology at [phone]. This test typically takes about 30 minutes to perform.   _______________________________________________________  If your blood pressure at your visit was 140/90 or greater, please contact your primary care physician to follow up on this.  _______________________________________________________  If you are age 18 or older, your body mass index should be between 23-30. Your Body mass index is 18.64 kg/m. If this is out of the aforementioned range listed, please consider follow up with your Primary Care Provider.  If you are age 47 or younger, your body mass index should be between 19-25. Your Body mass index is 18.64 kg/m. If this is out of the aformentioned range listed, please consider follow up with your Primary Care Provider.   ________________________________________________________  The [facility] providers would like to encourage you to use MYCHART to communicate with providers for non-urgent requests or questions.  Due to long hold times on the telephone, sending your provider a message by [facility] may be a faster and more efficient way to get a response.  Please allow 48 business hours for a response.  Please remember that this is for non-urgent requests.  _______________________________________________________   Due to recent changes in healthcare laws, you may see the results of your imaging and laboratory studies on MyChart before  your provider has had a chance to review them.  We understand that in some cases there may be results that are confusing or concerning to you. Not all laboratory results come back in the same time frame and the provider may be waiting for multiple results in order to interpret others.  Please give us  48 hours in order for your provider to thoroughly review all the results before contacting the office for clarification of your results.  \  It was a pleasure to see you today!  Thank you for trusting me with your gastrointestinal care!    Scott E.Cherryl Corona, MD
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
## 2023-05-31 LAB — AFP TUMOR MARKER: AFP-Tumor Marker: 4.1 ng/mL

## 2023-06-04 ENCOUNTER — Other Ambulatory Visit (INDEPENDENT_AMBULATORY_CARE_PROVIDER_SITE_OTHER)

## 2023-06-04 DIAGNOSIS — D649 Anemia, unspecified: Secondary | ICD-10-CM

## 2023-06-04 DIAGNOSIS — K7031 Alcoholic cirrhosis of liver with ascites: Secondary | ICD-10-CM | POA: Diagnosis not present

## 2023-06-04 LAB — FECAL OCCULT BLOOD, IMMUNOCHEMICAL: Fecal Occult Bld: POSITIVE — AB

## 2023-06-05 ENCOUNTER — Ambulatory Visit: Payer: Self-pay | Admitting: Gastroenterology

## 2023-06-05 ENCOUNTER — Other Ambulatory Visit (HOSPITAL_COMMUNITY)

## 2023-06-05 DIAGNOSIS — R195 Other fecal abnormalities: Secondary | ICD-10-CM

## 2023-06-05 DIAGNOSIS — K2100 Gastro-esophageal reflux disease with esophagitis, without bleeding: Secondary | ICD-10-CM

## 2023-06-05 NOTE — Progress Notes (Signed)
 Marisa Contreras,  Your labs looked good, but your stool was positive for blood.  Let's plan to repeat the upper endoscopy to ensure that your esophagitis has healed.  Team,  Please schedule Marisa Contreras for a routine EGD in the [facility] Thanks

## 2023-06-17 ENCOUNTER — Ambulatory Visit (HOSPITAL_COMMUNITY)
Admission: RE | Admit: 2023-06-17 | Discharge: 2023-06-17 | Disposition: A | Discharge status: Home / Self Care | Source: Ambulatory Visit | Attending: Gastroenterology | Admitting: Gastroenterology

## 2023-06-17 DIAGNOSIS — K7031 Alcoholic cirrhosis of liver with ascites: Secondary | ICD-10-CM | POA: Diagnosis present

## 2023-06-18 NOTE — Progress Notes (Signed)
 Maecyn, There were no concerning lesions or masses in the liver.  Gallstones were noted.  There is no reason to have your gallbladder removed without symptoms attributable to the gallstones.  Even if you were having symptoms, the surgeon would be very hesitant to perform surgery because of your cirrhosis.  Please plan to repeat ultrasound in 6 months to screen for liver cancer.  Maya,  Please place reminder for repeat RUQ ultrasound in 6 months.

## 2023-07-22 NOTE — Telephone Encounter (Signed)
 Patient called and stated that she was still wanting to know if she is needing to have a EGD and a colonoscopy. Patient is requesting to speak to the nurse. Please advise.

## 2023-07-25 ENCOUNTER — Encounter: Payer: Self-pay | Admitting: Gastroenterology

## 2023-07-25 NOTE — Addendum Note (Signed)
 Addended by: CLAUDENE NAOMIE SAILOR on: 07/25/2023 08:57 AM   Modules accepted: Orders

## 2023-08-14 ENCOUNTER — Encounter: Admitting: Gastroenterology

## 2023-08-28 ENCOUNTER — Telehealth: Payer: Self-pay | Admitting: *Deleted

## 2023-08-28 ENCOUNTER — Encounter: Payer: Self-pay | Admitting: Gastroenterology

## 2023-08-28 ENCOUNTER — Ambulatory Visit: Admitting: Gastroenterology

## 2023-08-28 DIAGNOSIS — K2100 Gastro-esophageal reflux disease with esophagitis, without bleeding: Secondary | ICD-10-CM

## 2023-08-28 NOTE — Telephone Encounter (Signed)
 Pt arrived for EGD today but ate 3 apricots at 7:30 am. States she did not received instructions or any appointment details for the procedure today. RN pulled up result note that office RN sent to the pt with the instructions in that note. Office RN also documented that she spoke with the pt over the phone and verbally reviewed instructions. Pt states she called yesterday to ask about appointment details as she reports she has not seen or heard anything. RN asked if she saw the appointment reminder and note from the nurse in MyChart. Pt states I have a job, I don't live on MyChart. RN showed pt that she last logged in on 08/21/23 to her MyChart. The appointment was scheduled on 07/25/23 when pt called on that day to cancel an appointment already scheduled for 08/14/23. Instructions sent to pt on 07/25/23 as well (they are attached to the 06/05/23 results follow-up note). Pt at first did not want to reschedule the procedure and did not feel it was necessary based on one fecal occult positive test. RN reviewed possible causes of the positive test and that the EGD is the next step in diagnostics. Offered her to speak with MD, she declined at this time. Offered to schedule an office visit to discuss need to EGD, she declined. Ultimately she agreed to reschedule EGD on 09/05/23 at 8:30. Printed and reviewed instructions with pt and sent to her MyChart.   MD: pt was very concerned about being charged a late cancellation fee for today. Please advise if she should be charged or not.

## 2023-08-28 NOTE — Progress Notes (Signed)
 Pt arrived for EGD today but ate 3 apricots at 7:30 am. States she did not received instructions or any appointment details for the procedure today. RN pulled up result note that office RN sent to the pt with the instructions in that note. Office RN also documented that she spoke with the pt over the phone and verbally reviewed instructions. Pt states she called yesterday to ask about appointment details as she reports she has not seen or heard anything. RN asked if she saw the appointment reminder and note from the nurse in MyChart. Pt states I have a job, I don't live on MyChart. RN showed pt that she last logged in on 08/21/23 to her MyChart. The appointment was scheduled on 07/25/23 when pt called on that day to cancel an appointment already scheduled for 08/14/23. Instructions sent to pt on 07/25/23 as well (they are attached to the 06/05/23 results follow-up note). Pt at first did not want to reschedule the procedure and did not feel it was necessary based on one fecal occult positive test. RN reviewed possible causes of the positive test and that the EGD is the next step in diagnostics. Offered her to speak with MD, she declined at this time. Offered to schedule an office visit to discuss need to EGD, she declined. Ultimately she agreed to reschedule EGD on 09/05/23 at 8:30. Printed and reviewed instructions with pt and sent to her MyChart.

## 2023-09-05 ENCOUNTER — Ambulatory Visit (AMBULATORY_SURGERY_CENTER): Admitting: Gastroenterology

## 2023-09-05 ENCOUNTER — Encounter: Payer: Self-pay | Admitting: Gastroenterology

## 2023-09-05 VITALS — BP 114/68 | HR 60 | Temp 97.9°F | Resp 14 | Ht 67.0 in | Wt 125.2 lb

## 2023-09-05 DIAGNOSIS — K2289 Other specified disease of esophagus: Secondary | ICD-10-CM

## 2023-09-05 DIAGNOSIS — K2100 Gastro-esophageal reflux disease with esophagitis, without bleeding: Secondary | ICD-10-CM

## 2023-09-05 DIAGNOSIS — I8500 Esophageal varices without bleeding: Secondary | ICD-10-CM

## 2023-09-05 DIAGNOSIS — I8510 Secondary esophageal varices without bleeding: Secondary | ICD-10-CM

## 2023-09-05 DIAGNOSIS — K7031 Alcoholic cirrhosis of liver with ascites: Secondary | ICD-10-CM

## 2023-09-05 MED ORDER — SODIUM CHLORIDE 0.9 % IV SOLN: 500.0000 mL | Freq: Once | INTRAVENOUS | Status: DC

## 2023-09-05 MED ORDER — CARVEDILOL 6.25 MG PO TABS: 6.2500 mg | ORAL_TABLET | Freq: Every day | ORAL | 3 refills | Status: AC

## 2023-09-05 NOTE — Op Note (Signed)
 [facility] Patient Name: Marisa Contreras Procedure Date: 09/05/2023 9:02 AM MRN: [id number] Endoscopist: Glendia E. Stacia , MD, [id number] Age: 48 Referring MD:  Date of Birth: [birth year] Gender: Female Account #: [id number] Procedure:                Upper GI endoscopy Indications:              Follow-up of esophageal reflux esophagitis Medicines:                Monitored Anesthesia Care Procedure:                Pre-Anesthesia Assessment:                           - Prior to the procedure, a History and Physical                            was performed, and patient medications and                            allergies were reviewed. The patient's tolerance of                            previous anesthesia was also reviewed. The risks                            and benefits of the procedure and the sedation                            options and risks were discussed with the patient.                            All questions were answered, and informed consent                            was obtained. Prior Anticoagulants: The patient has                            taken no anticoagulant or antiplatelet agents. ASA                            Grade Assessment: II - A patient with mild systemic                            disease. After reviewing the risks and benefits,                            the patient was deemed in satisfactory condition to                            undergo the procedure.                           After obtaining informed consent, the endoscope was  passed under direct vision. Throughout the                            procedure, the patient's blood pressure, pulse, and                            oxygen saturations were monitored continuously. The                            GIF [id number] #[id number] was introduced through the                            mouth, and advanced to the second part of duodenum.                            The  upper GI endoscopy was accomplished without                            difficulty. The patient tolerated the procedure                            well. Scope In: Scope Out: Findings:                 The examined portions of the nasopharynx,                            oropharynx and larynx were normal.                           The Z-line was irregular.                           Grade II varices were found in the distal                            esophagus. They were medium in size.                           The exam of the esophagus was otherwise normal.                           The entire examined stomach was normal.                           The examined duodenum was normal. Complications:            No immediate complications. Estimated Blood Loss:     Estimated blood loss: none. Impression:               - The examined portions of the nasopharynx,                            oropharynx and larynx were normal.                           - Z-line  irregular. The previously noted reflux                            esophagitis has healed.                           - Grade II esophageal varices. These have increased                            in size since November.                           - Normal stomach. Previously noted portal                            hypertensive gastropathy not appreciated on exam                            today.                           - Normal examined duodenum.                           - No specimens collected. Recommendation:           - Patient has a contact number available for                            emergencies. The signs and symptoms of potential                            delayed complications were discussed with the                            patient. Return to normal activities tomorrow.                            Written discharge instructions were provided to the                            patient.                           - Resume previous  diet.                           - Continue present medications.                           - Repeat upper endoscopy in 2 years for                            surveillance.                           - Recommend starting carvedilol  6.25 mg PO daily  for variceal bleeding prophylaxis.                           - Ok to start weaning pantoprazole  (take every                            other day for 2 weeks, then stop if not having                            frequent GERD symptoms) Luismario Coston E. Stacia, MD 09/05/2023 9:42:31 AM This report has been signed electronically.
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
## 2023-09-05 NOTE — Patient Instructions (Addendum)
 Resume previous diet Avoid alcohol Continue present medications Ok to wean off Pantoprazole , take every other day for 2 weeks, then stop if not having frequent GERD symptoms. Recommend starting Carvedilol  6.25 mg orally daily for variceal bleeding prophylaxis  Handouts/information given for esophageal varices and carvedilol   YOU HAD AN ENDOSCOPIC PROCEDURE TODAY AT THE  ENDOSCOPY CENTER:   Refer to the procedure report that was given to you for any specific questions about what was found during the examination.  If the procedure report does not answer your questions, please call your gastroenterologist to clarify.  If you requested that your care partner not be given the details of your procedure findings, then the procedure report has been included in a sealed envelope for you to review at your convenience later.  YOU SHOULD EXPECT: Some feelings of bloating in the abdomen. Passage of more gas than usual.  Walking can help get rid of the air that was put into your GI tract during the procedure and reduce the bloating. If you had a lower endoscopy (such as a colonoscopy or flexible sigmoidoscopy) you may notice spotting of blood in your stool or on the toilet paper. If you underwent a bowel prep for your procedure, you may not have a normal bowel movement for a few days.  Please Note:  You might notice some irritation and congestion in your nose or some drainage.  This is from the oxygen used during your procedure.  There is no need for concern and it should clear up in a day or so.  SYMPTOMS TO REPORT IMMEDIATELY:  Following upper endoscopy (EGD)  Vomiting of blood or coffee ground material  New chest pain or pain under the shoulder blades  Painful or persistently difficult swallowing  New shortness of breath  Fever of 100F or higher  Black, tarry-looking stools For urgent or emergent issues, a gastroenterologist can be reached at any hour by calling [phone]. Do not use  MyChart messaging for urgent concerns.   DIET:  We do recommend a small meal at first, but then you may proceed to your regular diet.  Drink plenty of fluids but you should avoid alcoholic beverages for 24 hours.  ACTIVITY:  You should plan to take it easy for the rest of today and you should NOT DRIVE or use heavy machinery until tomorrow (because of the sedation medicines used during the test).    FOLLOW UP: Our staff will call the number listed on your records the next business day following your procedure.  We will call around 7:15- 8:00 am to check on you and address any questions or concerns that you may have regarding the information given to you following your procedure. If we do not reach you, we will leave a message.     SIGNATURES/CONFIDENTIALITY: You and/or your care partner have signed paperwork which will be entered into your electronic medical record.  These signatures attest to the fact that that the information above on your After Visit Summary has been reviewed and is understood.  Full responsibility of the confidentiality of this discharge information lies with you and/or your care-partner.

## 2023-09-05 NOTE — Progress Notes (Signed)
 Vss nad trans to pacu

## 2023-09-06 ENCOUNTER — Telehealth: Payer: Self-pay | Admitting: *Deleted

## 2023-09-06 NOTE — Telephone Encounter (Signed)
  Follow up Call-     09/05/2023    8:37 AM  Call back number  Post procedure Call Back phone  # [phone]  Permission to leave phone message Yes     Patient questions:  Do you have a fever, pain , or abdominal swelling? No. Pain Score  0 *  Have you tolerated food without any problems? Yes.    Have you been able to return to your normal activities? Yes.    Do you have any questions about your discharge instructions: Diet   No. Medications  No. Follow up visit  No.  Do you have questions or concerns about your Care? No.  Actions: * If pain score is 4 or above: No action needed, pain <4.

## 2023-11-07 LAB — LAB REPORT - SCANNED: EGFR: 64

## 2023-11-20 ENCOUNTER — Encounter: Payer: Self-pay | Admitting: *Deleted

## 2023-11-20 ENCOUNTER — Telehealth: Payer: Self-pay | Admitting: Gastroenterology

## 2023-11-20 NOTE — Telephone Encounter (Signed)
 Patient wanting to schedule banding with you but said the two of you had not discussed it.  Please advise if she is a candidate.  In the past she was anemic and told she couldn't have anything down, no longer anemic.    Please advise.

## 2023-11-20 NOTE — Telephone Encounter (Signed)
 Inbound call from patient requesting to know if she can go ahead and schedule for a hemorrhoid banding due to her no longer being anemic . Patient is requesting a call back. Please advise.

## 2023-11-21 NOTE — Telephone Encounter (Signed)
 Pt notified of recommendations per Dr. Stacia via rhona.

## 2023-12-03 ENCOUNTER — Other Ambulatory Visit: Payer: Self-pay

## 2023-12-03 DIAGNOSIS — I8510 Secondary esophageal varices without bleeding: Secondary | ICD-10-CM

## 2023-12-03 DIAGNOSIS — K7031 Alcoholic cirrhosis of liver with ascites: Secondary | ICD-10-CM

## 2023-12-09 ENCOUNTER — Ambulatory Visit (HOSPITAL_COMMUNITY): Admission: RE | Admit: 2023-12-09 | Discharge: 2023-12-09 | Attending: Gastroenterology

## 2023-12-09 DIAGNOSIS — K7031 Alcoholic cirrhosis of liver with ascites: Secondary | ICD-10-CM

## 2023-12-09 DIAGNOSIS — I8510 Secondary esophageal varices without bleeding: Secondary | ICD-10-CM

## 2023-12-10 ENCOUNTER — Encounter: Payer: Self-pay | Admitting: Gastroenterology

## 2023-12-10 ENCOUNTER — Ambulatory Visit: Admitting: Gastroenterology

## 2023-12-10 VITALS — BP 120/86 | HR 54 | Ht 67.0 in | Wt 127.8 lb

## 2023-12-10 DIAGNOSIS — K642 Third degree hemorrhoids: Secondary | ICD-10-CM

## 2023-12-10 NOTE — Patient Instructions (Addendum)
 HEMORRHOID BANDING PROCEDURE    FOLLOW-UP CARE   The procedure you have had should have been relatively painless since the banding of the area involved does not have nerve endings and there is no pain sensation.  The rubber band cuts off the blood supply to the hemorrhoid and the band may fall off as soon as 48 hours after the banding (the band may occasionally be seen in the toilet bowl following a bowel movement). You may notice a temporary feeling of fullness in the rectum which should respond adequately to plain Tylenol  or Motrin .  Following the banding, avoid strenuous exercise that evening and resume full activity the next day.  A sitz bath (soaking in a warm tub) or bidet is soothing, and can be useful for cleansing the area after bowel movements.     To avoid constipation, take two tablespoons of natural wheat bran, natural oat bran, flax, Benefiber or any over the counter fiber supplement and increase your water intake to 7-8 glasses daily.    Unless you have been prescribed anorectal medication, do not put anything inside your rectum for two weeks: No suppositories, enemas, fingers, etc.  Occasionally, you may have more bleeding than usual after the banding procedure.  This is often from the untreated hemorrhoids rather than the treated one.  Don't be concerned if there is a tablespoon or so of blood.  If there is more blood than this, lie flat with your bottom higher than your head and apply an ice pack to the area. If the bleeding does not stop within a half an hour or if you feel faint, call our office at [phone] or go to the emergency room.  Problems are not common; however, if there is a substantial amount of bleeding, severe pain, chills, fever or difficulty passing urine (very rare) or other problems, you should call us  at [phone] or report to the nearest emergency room.  Do not stay seated continuously for more than 2-3 hours for a day or two after the procedure.   Tighten your buttock muscles 10-15 times every two hours and take 10-15 deep breaths every 1-2 hours.  Do not spend more than a few minutes on the toilet if you cannot empty your bowel; instead re-visit the toilet at a later time.    Contact our office if you want to proceed with another banding.  _______________________________________________________  If your blood pressure at your visit was 140/90 or greater, please contact your primary care physician to follow up on this.  _______________________________________________________  If you are age 5 or older, your body mass index should be between 23-30. Your Body mass index is 20.02 kg/m. If this is out of the aforementioned range listed, please consider follow up with your Primary Care Provider.  If you are age 79 or younger, your body mass index should be between 19-25. Your Body mass index is 20.02 kg/m. If this is out of the aformentioned range listed, please consider follow up with your Primary Care Provider.   ________________________________________________________  The [facility] providers would like to encourage you to use MYCHART to communicate with providers for non-urgent requests or questions.  Due to long hold times on the telephone, sending your provider a message by [facility] may be a faster and more efficient way to get a response.  Please allow 48 business hours for a response.  Please remember that this is for non-urgent requests.  _______________________________________________________  [facility] is using a team-based approach  to care.  Your team is made up of your doctor and two to three APPS. Our APPS (Nurse Practitioners and Physician Assistants) work with your physician to ensure care continuity for you. They are fully qualified to address your health concerns and develop a treatment plan. They communicate directly with your gastroenterologist to care for you. Seeing the Advanced Practice Practitioners  on your physician's team can help you by facilitating care more promptly, often allowing for earlier appointments, access to diagnostic testing, procedures, and other specialty referrals.

## 2023-12-10 NOTE — Progress Notes (Signed)
  Discussed the use of AI scribe software for clinical note transcription with the patient, who gave verbal consent to proceed.  HPI:   Marisa Contreras is a 48 year old female who presents with prolapsing hemorrhoids causing discomfort and hygiene issues.  She experiences prolapsing hemorrhoids that itch, burn, and cause discomfort. The hemorrhoids interfere with cleanliness and hygiene, significantly impacting her daily life. They protrude from the anal canal, and although she sometimes tries to push them back in, they do not stay in place.   She is concerned about the symptoms of prolapse and is seeking treatment options to alleviate these symptoms. She has read about hemorrhoid banding and is interested in understanding if this procedure could help her condition. She is worried about potential complications of the procedure, such as bleeding and infection, especially given her understanding of the healing process and the presence of fecal matter near the treated area.  No issues with bowel movements, which are normal with no problems with straining or hard stools.      PROCEDURE NOTE: The patient presents with symptomatic grade 3  hemorrhoids, requesting rubber band ligation of his/her hemorrhoidal disease.  All risks, benefits and alternative forms of therapy were described and informed consent was obtained.  Inspection of the perineum revealed redundant external hemorrhoid tissue, with a prominent fold which may have included some prolapsed internal hemorrhoid along the right anterior hemorrhoid column.  This tissue was partially reducible.  No anal fissure present. Digital rectal exam showed normal resting sphincter tone and was negative for mass lesion.  An anoscopic exam was performed to better assess the internal hemorrhoids.  This exam showed a moderately sized hemorrhoid columns arising from the right posterior and right anterior hemorrhoid columns.  The decision was made to band the  right anterior internal hemorrhoid, and the [facility] [employer] System was used to perform band ligation without complication.  Digital anorectal examination was then performed to assure proper positioning of the band, and to adjust the banded tissue as required.  The patient was discharged home without pain or other issues.  Dietary and behavioral recommendations were given and along with follow-up instructions.     The following adjunctive treatments were recommended:  Fiber supplementation Adequate water intake Avoidance of straining and hard stools  The patient will return in 2-4 weeks for  follow-up and possible additional banding as required. No complications were encountered and the patient tolerated the procedure well.

## 2023-12-16 ENCOUNTER — Ambulatory Visit: Payer: Self-pay | Admitting: Gastroenterology

## 2023-12-16 NOTE — Progress Notes (Signed)
 Marisa Contreras,  Your ultrasound did not show any findings concerning for liver cancer.  Please plan to repeat ultrasound again in 6 months to screen for liver cancer.
